# Patient Record
Sex: Male | Born: 1945 | Race: White | Hispanic: No | Marital: Married
Health system: Midwestern US, Community
[De-identification: ages and names within clinical notes are randomized; demographics above are authoritative.]

## PROBLEM LIST (undated history)

## (undated) DIAGNOSIS — I25709 Atherosclerosis of coronary artery bypass graft(s), unspecified, with unspecified angina pectoris: Principal | ICD-10-CM

## (undated) DIAGNOSIS — R350 Frequency of micturition: Secondary | ICD-10-CM

## (undated) DIAGNOSIS — G5602 Carpal tunnel syndrome, left upper limb: Secondary | ICD-10-CM

## (undated) DIAGNOSIS — I5023 Acute on chronic systolic (congestive) heart failure: Principal | ICD-10-CM

## (undated) DIAGNOSIS — M353 Polymyalgia rheumatica: Secondary | ICD-10-CM

## (undated) DIAGNOSIS — N401 Enlarged prostate with lower urinary tract symptoms: Secondary | ICD-10-CM

## (undated) DIAGNOSIS — I480 Paroxysmal atrial fibrillation: Principal | ICD-10-CM

## (undated) DIAGNOSIS — I10 Essential (primary) hypertension: Principal | ICD-10-CM

## (undated) DIAGNOSIS — H44003 Unspecified purulent endophthalmitis, bilateral: Secondary | ICD-10-CM

## (undated) DIAGNOSIS — I509 Heart failure, unspecified: Principal | ICD-10-CM

## (undated) DIAGNOSIS — S81802A Unspecified open wound, left lower leg, initial encounter: Secondary | ICD-10-CM

## (undated) DIAGNOSIS — M25511 Pain in right shoulder: Principal | ICD-10-CM

## (undated) DIAGNOSIS — M5417 Radiculopathy, lumbosacral region: Secondary | ICD-10-CM

---

## 2016-08-22 IMAGING — DX ACUTE ABDOMINAL SERIES
4 series · 4 of 4 positions shown · non-contrast
Comparison: none

ACUTE ABDOMINAL SERIES, 08/22/2016 [DATE]:
CLINICAL INDICATION: History of chronic abdominal pain periumbilical. Previous
appendectomy and hernia repair

[AP (1 of 3)]
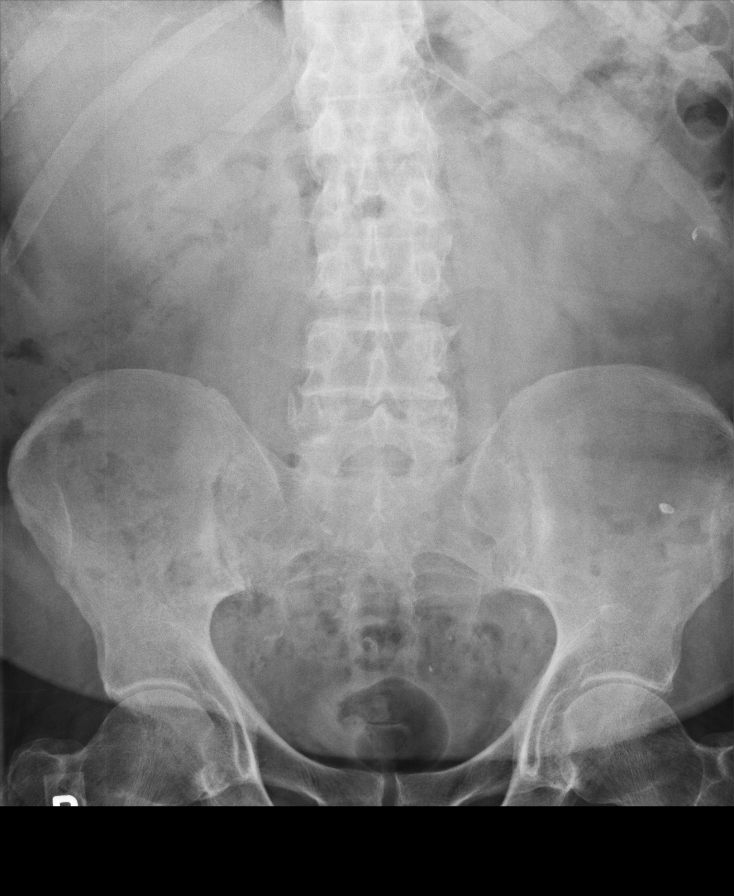

[AP (2 of 3)]
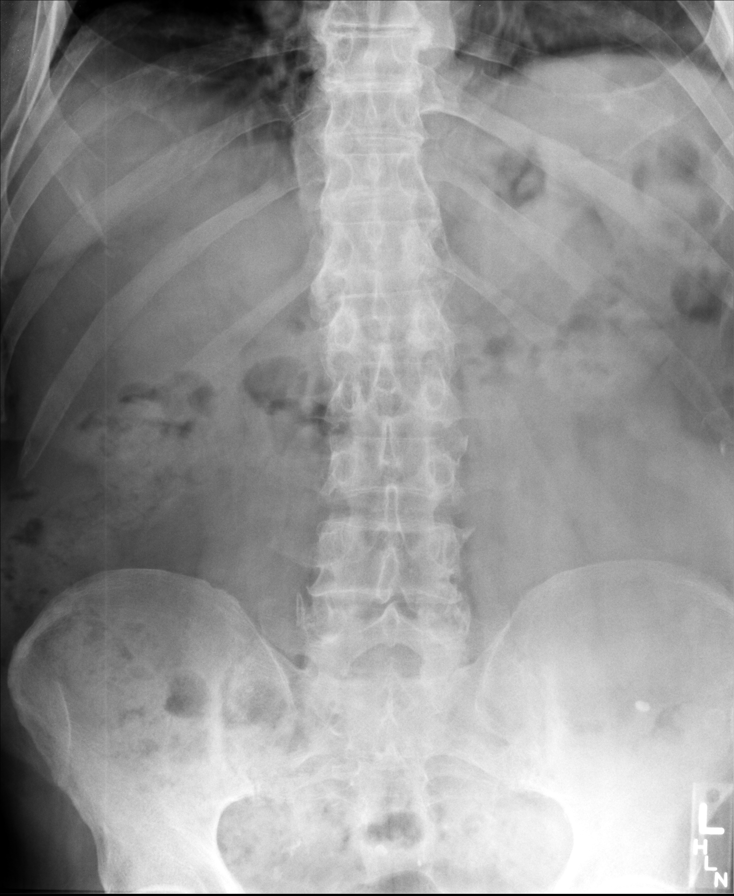

[AP (3 of 3)]
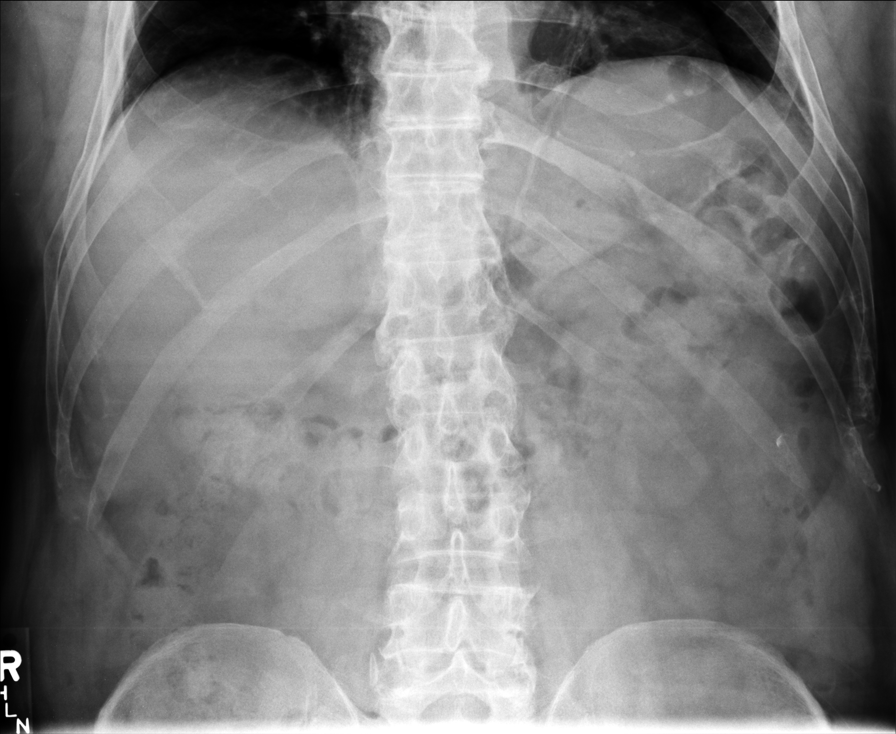

[PA]
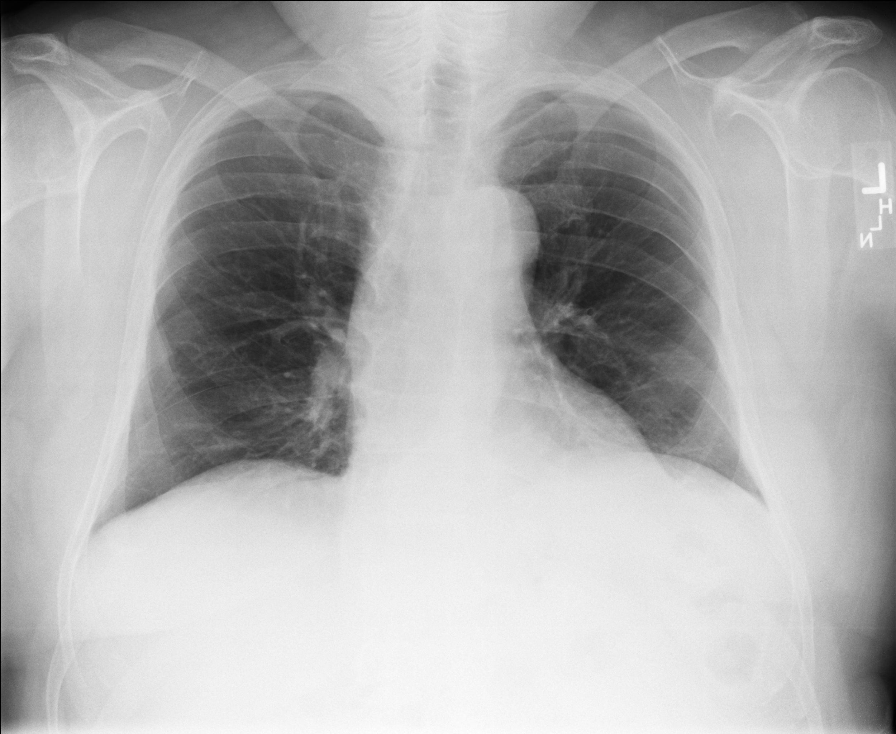

[4 of 4 positions shown; findings below may reference images not displayed]

FINDINGS: Stool is seen throughout the colon. Changes of old granulomatous
disease. Mild left basilar atelectasis. I do not see obstructive changes. No
destructive changes seen. No fracture.
IMPRESSION: Mild left basilar atelectasis, degenerative changes and stool throughout the
colon. Otherwise unremarkable exam. No obstructive changes seen.

## 2017-02-22 IMAGING — DX LUMBAR SPINE COMPLETE
5 series · 5 of 5 positions shown · non-contrast
Comparison: none

LUMBAR SPINE COMPLETE, 02/22/2017 [DATE]:
CLINICAL INDICATION:  History of back pain. Low back pain without
radiculopathy.
No trauma. Pain occurred after activity of working around the house.
COMPARISON EXAMINATIONS: Radiographs of the abdomen of 08/22/2016.

[swimmers]
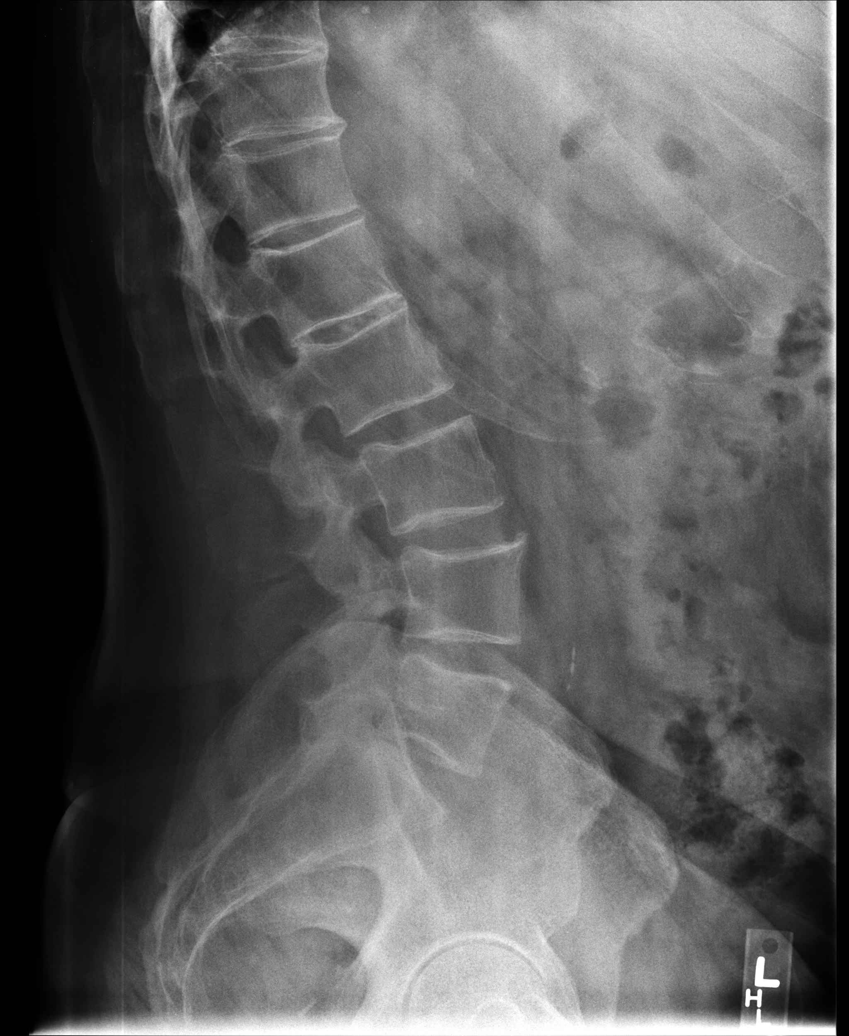

[lpo]
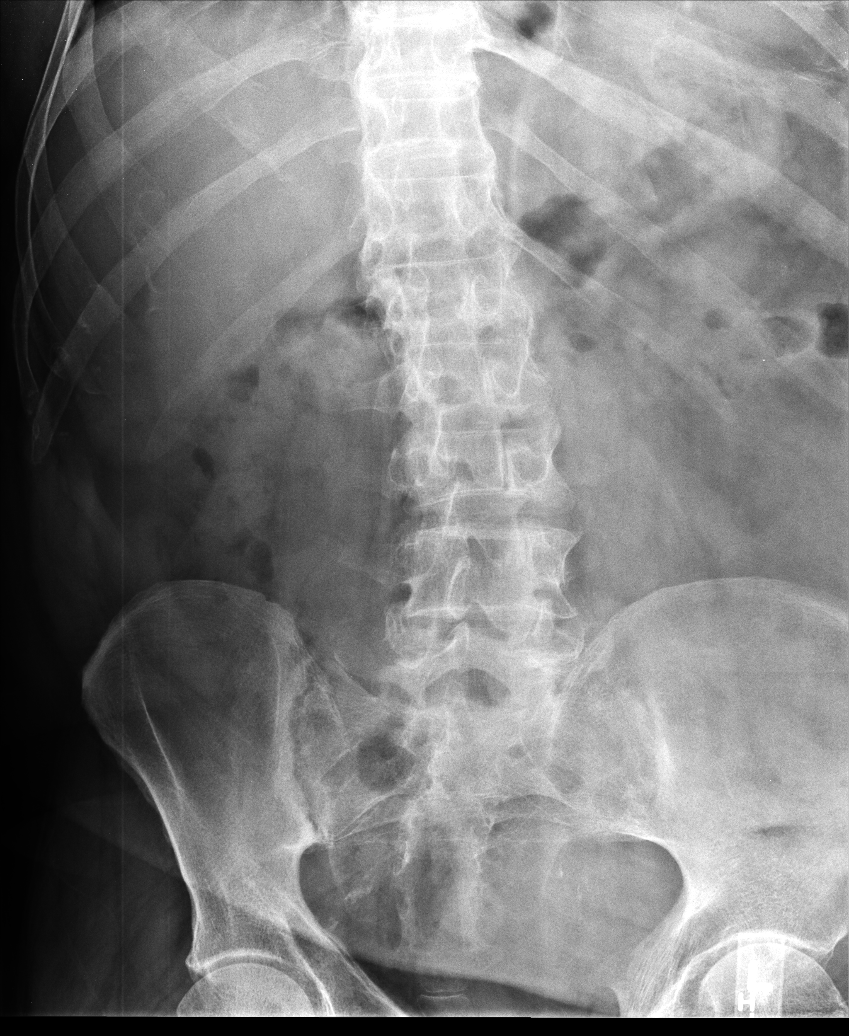

[l5 s1]
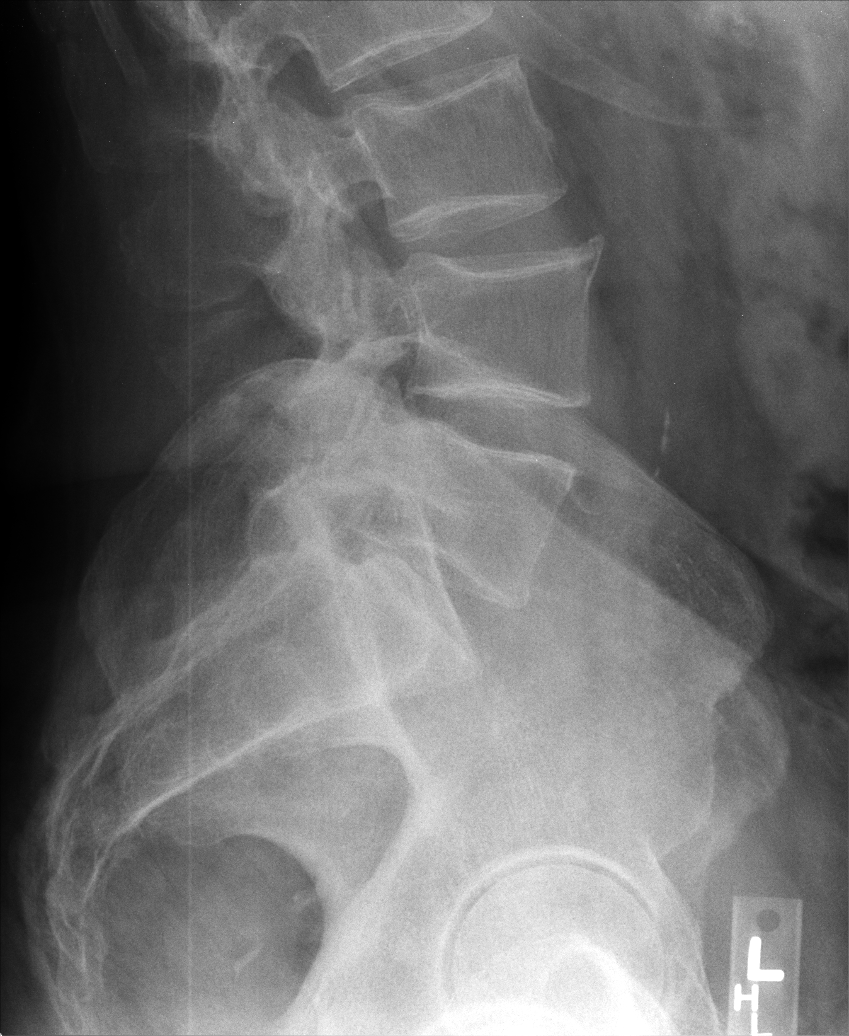

[AP]
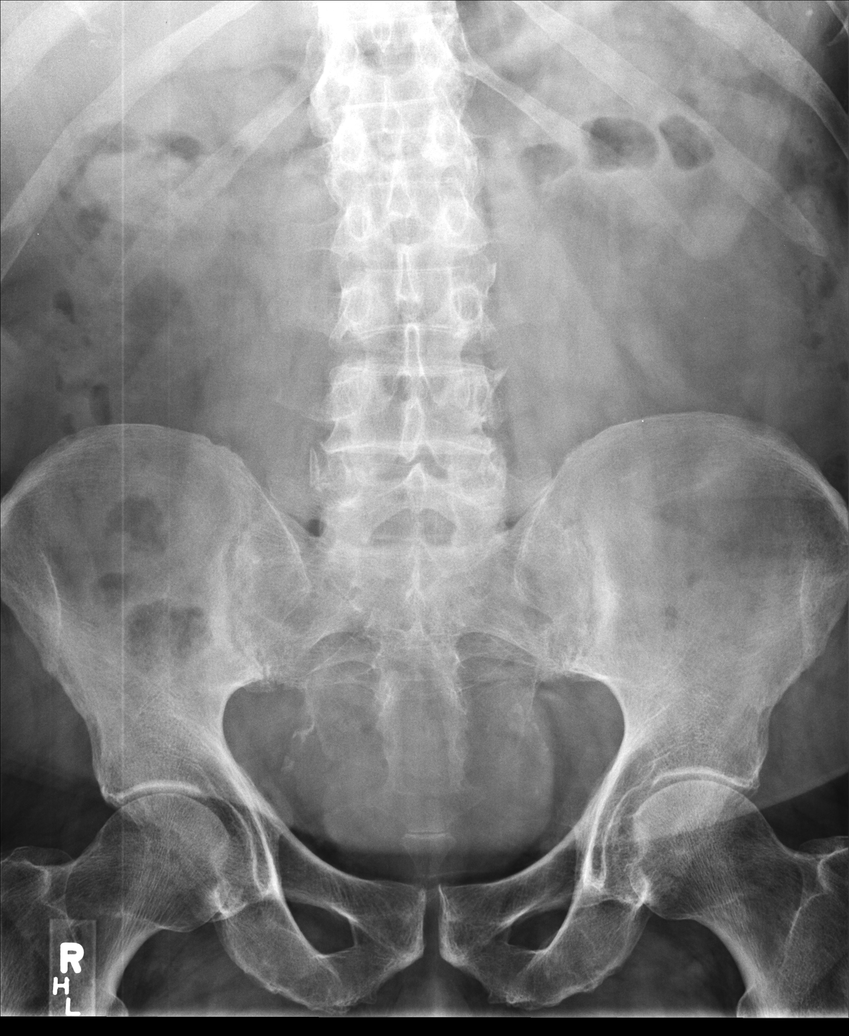

[rpo]
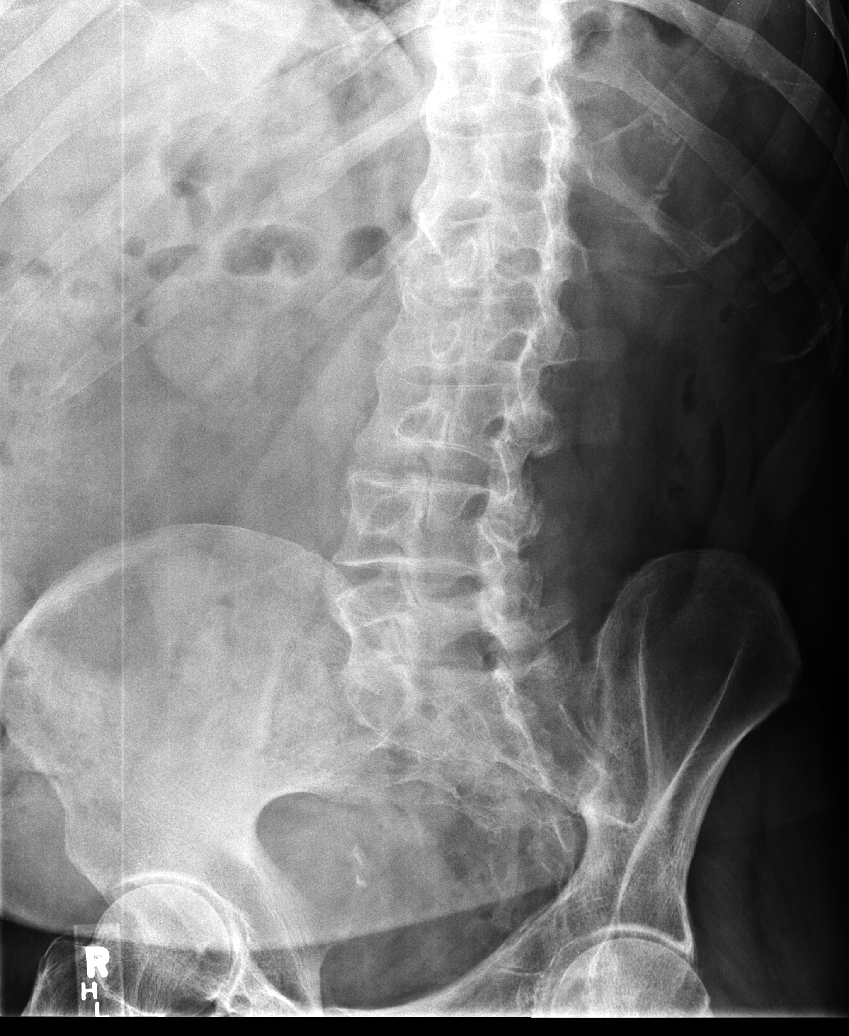

[5 of 5 positions shown; findings below may reference images not displayed]

FINDINGS: There are 5 lumbar-type vertebral bodies. No vertebral body
fracture.
No spondylolisthesis. Scattered interbody osteophytic spurring. Mild lower
lumbar facet hypertrophy. Mild degenerative changes SI joints. Mild vertebral
body endplate scalloping. Mild scattered vascular calcifications. Normal bowel
gas pattern.
IMPRESSION: Mild spondylotic changes lumbar spine.

## 2017-07-12 ENCOUNTER — Inpatient Hospital Stay: Admit: 2017-07-12 | Payer: MEDICARE | Primary: Internal Medicine

## 2017-07-12 ENCOUNTER — Encounter

## 2017-07-12 DIAGNOSIS — M545 Low back pain: Secondary | ICD-10-CM

## 2019-05-14 IMAGING — DX CHEST PA AND LATERAL
1 series · 2 of 2 positions shown · non-contrast
Comparison: none

CLINICAL INDICATION:  Short of breath on exertion intermittently for one year. 
Hypertension. On medication.

[Series 1: PA · U · 0.17mm/px · 2 of 2 slices shown]
[im 1/2]
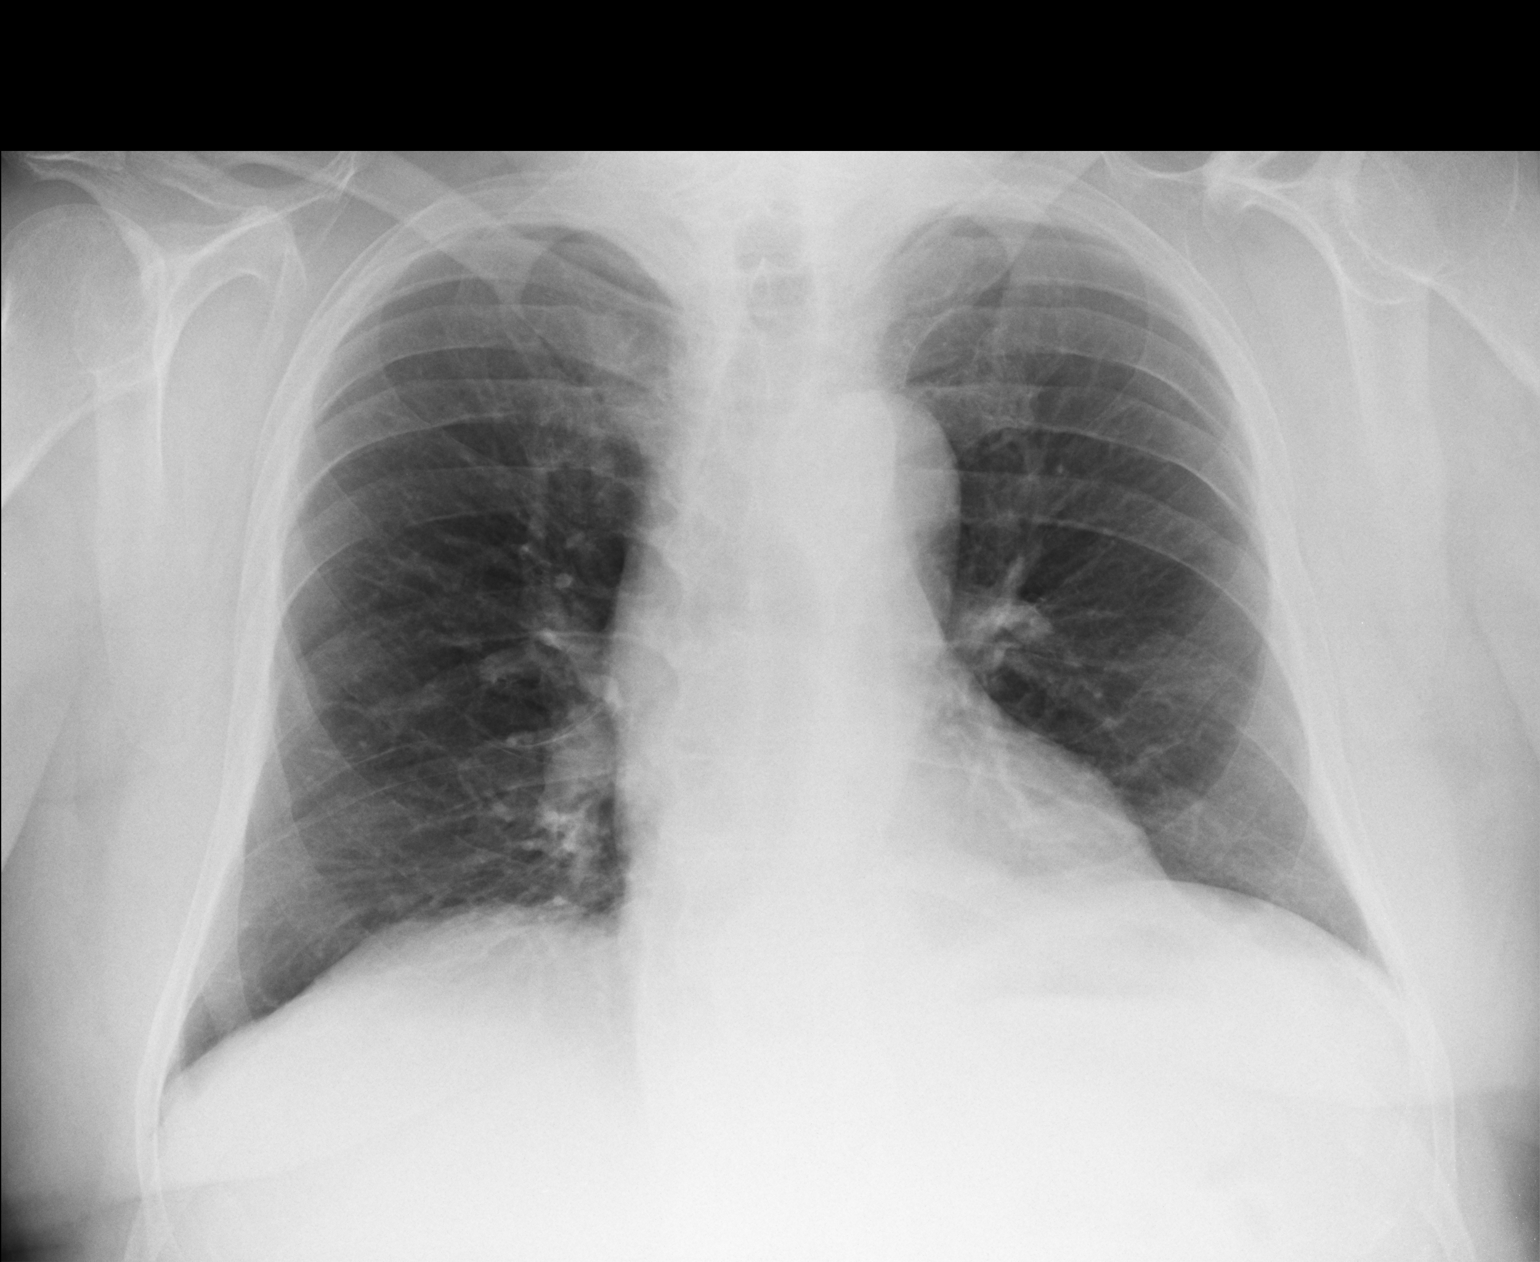
[im 2/2]
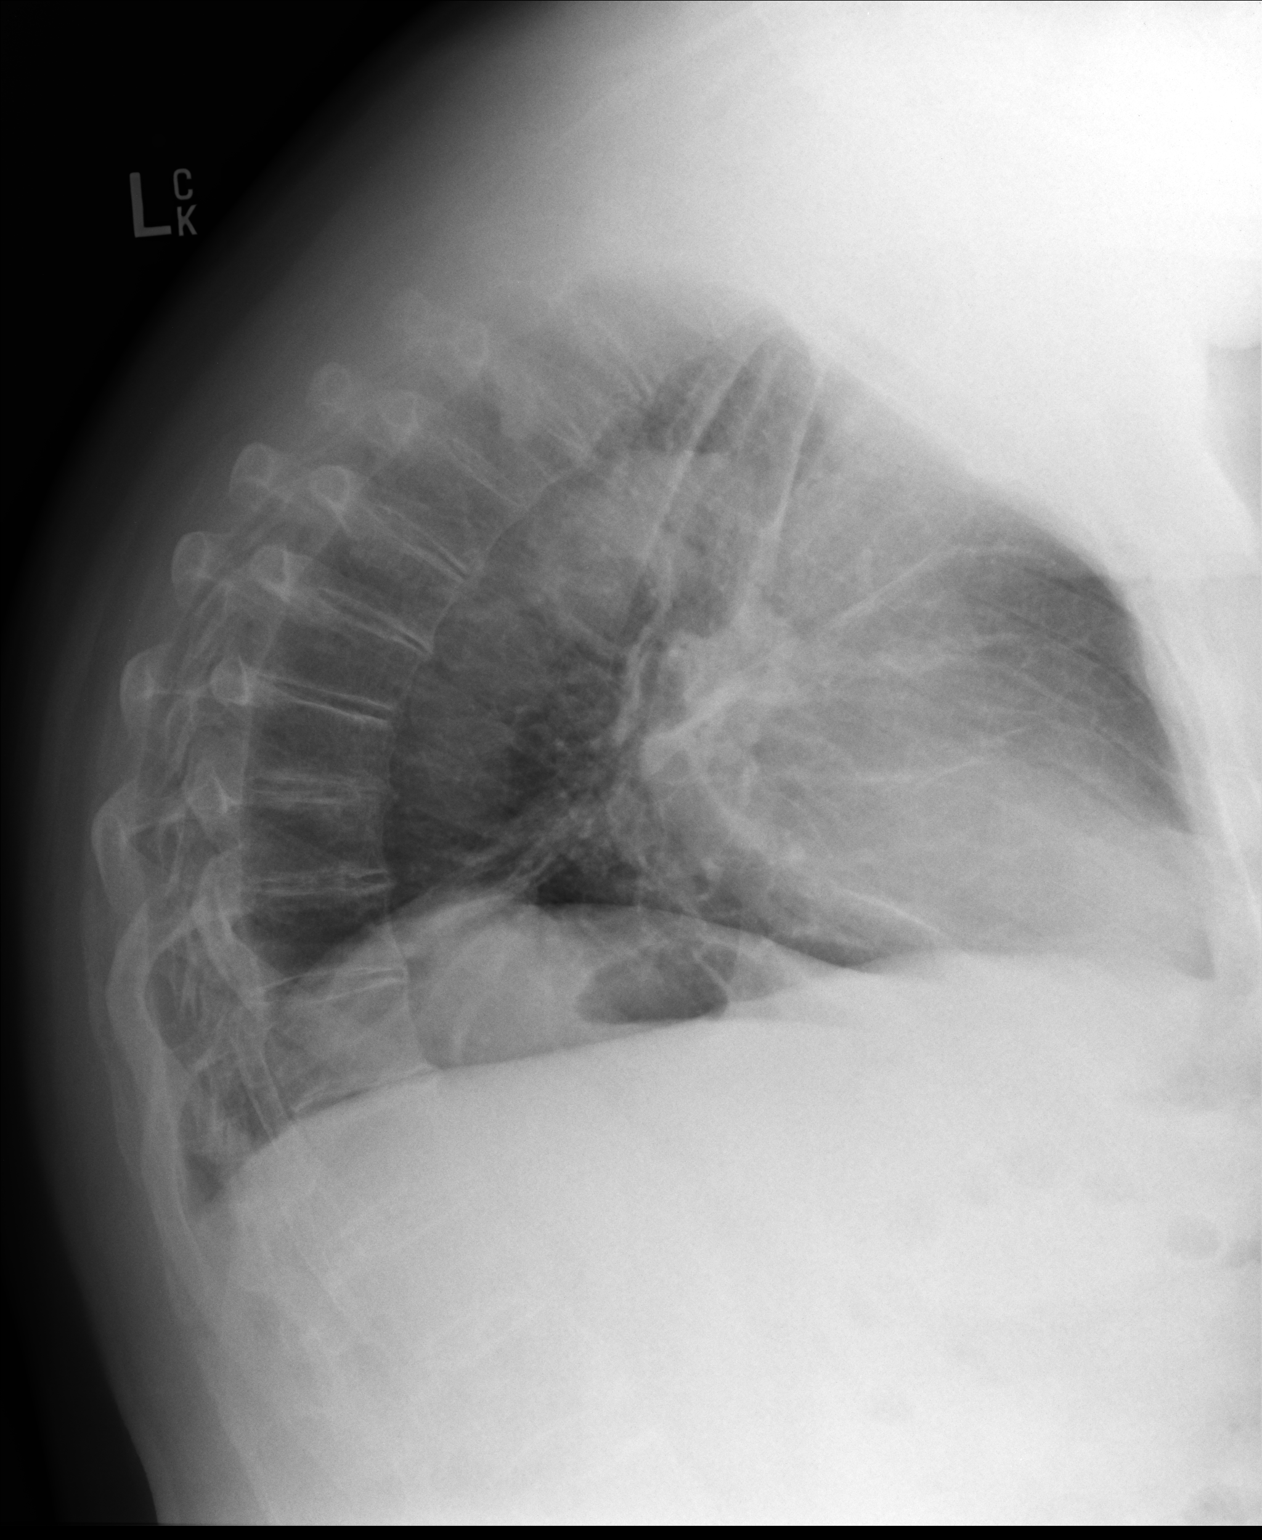

[2 of 2 positions shown; findings below may reference images not displayed]

FINDINGS: The heart and mediastinum are normal. The lungs are well-expanded and 
clear. The bones are intact. Obesity.
IMPRESSION: Normal findings on chest x-ray.

## 2019-09-04 ENCOUNTER — Emergency Department: Admit: 2019-09-04 | Payer: MEDICARE | Primary: Family Medicine

## 2019-09-04 ENCOUNTER — Inpatient Hospital Stay
Admission: EM | Admit: 2019-09-04 | Discharge: 2019-09-06 | Disposition: A | Discharge status: Home / Self Care | Payer: Medicare Other | Source: Other Acute Inpatient Hospital | Admitting: Family Medicine

## 2019-09-04 DIAGNOSIS — I4891 Unspecified atrial fibrillation: Secondary | ICD-10-CM

## 2019-09-04 LAB — CBC WITH AUTO DIFFERENTIAL
Basophils %: 0.7 % (ref 0.0–2.0)
Basophils Absolute: 0.08 E9/L (ref 0.00–0.20)
Eosinophils %: 1.1 % (ref 0.0–6.0)
Eosinophils Absolute: 0.13 E9/L (ref 0.05–0.50)
Hematocrit: 48.7 % (ref 37.0–54.0)
Hemoglobin: 16.6 g/dL — ABNORMAL HIGH (ref 12.5–16.5)
Immature Granulocytes #: 0.05 E9/L
Immature Granulocytes %: 0.4 % (ref 0.0–5.0)
Lymphocytes %: 21 % (ref 20.0–42.0)
Lymphocytes Absolute: 2.4 E9/L (ref 1.50–4.00)
MCH: 30.5 pg (ref 26.0–35.0)
MCV: 89.4 fL (ref 80.0–99.9)
MPV: 10.4 fL (ref 7.0–12.0)
Monocytes %: 7.7 % (ref 2.0–12.0)
Monocytes Absolute: 0.88 E9/L (ref 0.10–0.95)
Neutrophils %: 69.1 % (ref 43.0–80.0)
Neutrophils Absolute: 7.9 E9/L — ABNORMAL HIGH (ref 1.80–7.30)
Platelets: 277 E9/L (ref 130–450)
RBC: 5.45 E12/L (ref 3.80–5.80)
RDW: 12.9 fL (ref 11.5–15.0)
WBC: 11.4 E9/L (ref 4.5–11.5)

## 2019-09-04 LAB — BASIC METABOLIC PANEL W/ REFLEX TO MG FOR LOW K
Anion Gap: 16 mmol/L (ref 7–16)
BUN: 15 mg/dL (ref 8–23)
CO2: 26 mmol/L (ref 22–29)
Calcium: 9.3 mg/dL (ref 8.6–10.2)
Chloride: 96 mmol/L — ABNORMAL LOW (ref 98–107)
Creatinine: 1 mg/dL (ref 0.7–1.2)
GFR African American: >60
GFR Non-African American: >60 mL/min/{1.73_m2} (ref >=60)
Glucose: 111 mg/dL — ABNORMAL HIGH (ref 74–99)
Potassium reflex Magnesium: 3.5 mmol/L (ref 3.5–5.0)
Sodium: 138 mmol/L (ref 132–146)

## 2019-09-04 LAB — URINALYSIS
Bilirubin Urine: NEGATIVE
Glucose, Ur: NEGATIVE mg/dL
Ketones, Urine: NEGATIVE mg/dL
Leukocyte Esterase, Urine: NEGATIVE
Nitrite, Urine: NEGATIVE
Protein, UA: NEGATIVE mg/dL
Specific Gravity, UA: <=1.005 (ref 1.005–1.030)
Urobilinogen, Urine: 0.2 E.U./dL (ref <2.0)
pH, UA: 6 (ref 5.0–9.0)

## 2019-09-04 LAB — MICROSCOPIC URINALYSIS
Bacteria, UA: NONE SEEN /HPF
Epithelial Cells, UA: NONE SEEN /HPF

## 2019-09-04 LAB — TROPONIN: Troponin: <0.01 ng/mL (ref 0.00–0.03)

## 2019-09-04 LAB — MAGNESIUM: Magnesium: 2.1 mg/dL (ref 1.6–2.6)

## 2019-09-04 LAB — TSH: TSH: 1.67 u[IU]/mL (ref 0.270–4.200)

## 2019-09-04 LAB — BRAIN NATRIURETIC PEPTIDE: Pro-BNP: 106 pg/mL (ref 0–450)

## 2019-09-04 MED ORDER — HYDROCHLOROTHIAZIDE 12.5 MG PO TABS
12.5 MG | Freq: Every day | ORAL | Status: DC
Start: 2019-09-04 — End: 2019-09-06
  Administered 2019-09-05 – 2019-09-06 (×2): 12.5 mg via ORAL

## 2019-09-04 MED ORDER — LOSARTAN POTASSIUM-HCTZ 100-12.5 MG PO TABS
Freq: Every day | ORAL | Status: DC
Start: 2019-09-04 — End: 2019-09-04

## 2019-09-04 MED ORDER — PANTOPRAZOLE SODIUM 40 MG PO TBEC
40 | Freq: Every day | ORAL | Status: DC
Start: 2019-09-04 — End: 2019-09-06
  Administered 2019-09-05 – 2019-09-06 (×2): 40 mg via ORAL

## 2019-09-04 MED ORDER — LOSARTAN POTASSIUM 100 MG PO TABS
100 MG | Freq: Every day | ORAL | Status: DC
Start: 2019-09-04 — End: 2019-09-06
  Administered 2019-09-05 – 2019-09-06 (×2): 100 mg via ORAL

## 2019-09-04 MED ORDER — ASPIRIN 325 MG PO TABS
325 MG | Freq: Once | ORAL | Status: AC
Start: 2019-09-04 — End: 2019-09-04
  Administered 2019-09-04: 21:00:00 325 mg via ORAL

## 2019-09-04 MED ORDER — METOPROLOL SUCCINATE ER 25 MG PO TB24
25 MG | Freq: Every day | ORAL | Status: DC
Start: 2019-09-04 — End: 2019-09-04

## 2019-09-04 MED ORDER — ENOXAPARIN SODIUM 100 MG/ML SC SOLN
100 MG/ML | Freq: Two times a day (BID) | SUBCUTANEOUS | Status: DC
Start: 2019-09-04 — End: 2019-09-05
  Administered 2019-09-05: 13:00:00 100 mg via SUBCUTANEOUS

## 2019-09-04 MED ORDER — DILTIAZEM HCL 25 MG/5ML IV SOLN
255 MG/5ML | Freq: Once | INTRAVENOUS | Status: AC
Start: 2019-09-04 — End: 2019-09-04
  Administered 2019-09-04: 22:00:00 25 mg via INTRAVENOUS

## 2019-09-04 MED ORDER — DEXTROSE 5 % IV SOLN (ADD-VANTAGE)
5 % | INTRAVENOUS | Status: DC
Start: 2019-09-04 — End: 2019-09-04

## 2019-09-04 MED ORDER — DEXTROSE 5 % IV SOLN
5 % | INTRAVENOUS | Status: DC
Start: 2019-09-04 — End: 2019-09-04

## 2019-09-04 MED ORDER — ENOXAPARIN SODIUM 100 MG/ML SC SOLN
100 MG/ML | Freq: Once | SUBCUTANEOUS | Status: AC
Start: 2019-09-04 — End: 2019-09-04
  Administered 2019-09-04: 23:00:00 100 mg/kg via SUBCUTANEOUS

## 2019-09-04 MED FILL — DILTIAZEM HCL 125 MG/25ML IV SOLN: 125 MG/25ML | INTRAVENOUS | Qty: 25

## 2019-09-04 MED FILL — ASPIRIN 325 MG PO TABS: 325 mg | ORAL | Qty: 1

## 2019-09-04 MED FILL — DILTIAZEM HCL 25 MG/5ML IV SOLN: 25 MG/5ML | INTRAVENOUS | Qty: 5

## 2019-09-04 MED FILL — HYDROCHLOROTHIAZIDE 12.5 MG PO TABS: 12.5 mg | ORAL | Qty: 1

## 2019-09-04 MED FILL — COZAAR 100 MG PO TABS: 100 mg | ORAL | Qty: 1

## 2019-09-04 MED FILL — LOVENOX 100 MG/ML SC SOLN: 100 mg/mL | SUBCUTANEOUS | Qty: 1

## 2019-09-04 NOTE — ED Notes (Signed)
Pt. Report called to ICU.     Kathaleen Bury [facility], RN  09/04/19 1934

## 2019-09-04 NOTE — ED Provider Notes (Signed)
73 year old male with hypertension presenting with tachycardia.  He states that he has had intermittent episodes of racing heart for the past week and a half.  Today however his heart rate did feel faster and that is why he presented.  He states he had a similar episode a couple months ago, but was never seen for it.  He is also complaining of increased urination for the past month as well as increased thirst.  He is endorsing some chest pressure and shortness of breath.  He is denying dizziness, nausea, and recent illness.    The history is provided by the patient.   Palpitations   Palpitations quality:  Irregular  Onset quality:  Sudden  Timing:  Intermittent  Progression:  Worsening  Chronicity:  New  Relieved by:  Nothing  Worsened by:  Nothing  Ineffective treatments:  None tried  Associated symptoms: chest pressure and shortness of breath    Associated symptoms: no dizziness, no nausea, no numbness and no weakness         Review of Systems   Constitutional: Positive for fatigue. Negative for chills and fever.   Respiratory: Positive for shortness of breath.    Cardiovascular: Positive for palpitations.   Gastrointestinal: Negative for abdominal pain and nausea.   Endocrine: Positive for polydipsia.   Genitourinary: Positive for frequency.   Neurological: Negative for dizziness, weakness, light-headedness and numbness.        Physical Exam  Constitutional:       General: He is not in acute distress.     Appearance: Normal appearance. He is obese. He is not ill-appearing or toxic-appearing.      Comments: Appears uncomfortable   Eyes:      Extraocular Movements: Extraocular movements intact.      Conjunctiva/sclera: Conjunctivae normal.      Pupils: Pupils are equal, round, and reactive to light.   Cardiovascular:      Rate and Rhythm: Tachycardia present. Rhythm irregular.   Pulmonary:      Effort: Pulmonary effort is normal. No respiratory distress.      Breath sounds: Normal breath sounds.   Abdominal:       General: There is no distension.      Palpations: Abdomen is soft.      Tenderness: There is no abdominal tenderness.   Musculoskeletal:      Comments: Trace pitting edema lower extremities bilaterally   Skin:     General: Skin is warm and dry.   Neurological:      General: No focal deficit present.      Mental Status: He is alert and oriented to person, place, and time.          Procedures     MDM  Number of Diagnoses or Management Options  Atrial fibrillation with RVR [facility](HCC):   Diagnosis management comments: 73 year old male with hypertension presenting in new-onset A. fib RVR with heart rate up 152.  He received 25 mg Cardizem with decrease of heart rate to 111.  He was then placed on Cardizem drip.  CHA2DS2-VASc=  2.  CBC, BMP, BNP, TSH, troponin, urinalysis, chest x-ray unremarkable.  He received Lovenox and aspirin.  Dr. Excell SeltzerBaker was consulted and agreed to admit the patient.  He remained hemodynamically stable in the ED.  He was admitted in stable condition.           ED Course as of Sep 03 1844   Thu Sep 04, 2019   1639 EKG at 1636, HR  at 131, Afib with RVR, left axis devation, no previous EKG to compare    [BP]   1812 Rechecked patient, HR between 102-118 in room, no symptoms    [BP]   1814 Patient remains tachycardic.  Cardizem infusion ordered.    [AP]   [id number] Spoke with Dr. Excell Seltzer. Agreed with plan    [BP]      ED Course User Index  [AP] Arn Medal, MD  [BP] Dawson Bills, DO        --------------------------------------------- PAST HISTORY ---------------------------------------------  Past Medical History:  has no past medical history on file.    Past Surgical History:  has a past surgical history that includes Appendectomy.    Social History:  reports that he has never smoked. He does not have any smokeless tobacco history on file. He reports previous alcohol use. He reports that he does not use drugs.    Family History: family history is not on file.     The patient???s home medications have been  reviewed.    Allergies: Sulfa antibiotics    -------------------------------------------------- RESULTS -------------------------------------------------    Lab  Results for orders placed or performed during the hospital encounter of 09/04/19   CBC Auto Differential   Result Value Ref Range    WBC 11.4 4.5 - 11.5 E9/L    RBC 5.45 3.80 - 5.80 E12/L    Hemoglobin 16.6 (H) 12.5 - 16.5 g/dL    Hematocrit 28.3 66.2 - 54.0 %    MCV 89.4 80.0 - 99.9 fL    MCH 30.5 26.0 - 35.0 pg    MCHC 34.1 32.0 - 34.5 %    RDW 12.9 11.5 - 15.0 fL    Platelets 277 130 - 450 E9/L    MPV 10.4 7.0 - 12.0 fL    Neutrophils % 69.1 43.0 - 80.0 %    Immature Granulocytes % 0.4 0.0 - 5.0 %    Lymphocytes % 21.0 20.0 - 42.0 %    Monocytes % 7.7 2.0 - 12.0 %    Eosinophils % 1.1 0.0 - 6.0 %    Basophils % 0.7 0.0 - 2.0 %    Neutrophils Absolute 7.90 (H) 1.80 - 7.30 E9/L    Immature Granulocytes # 0.05 E9/L    Lymphocytes Absolute 2.40 1.50 - 4.00 E9/L    Monocytes Absolute 0.88 0.10 - 0.95 E9/L    Eosinophils Absolute 0.13 0.05 - 0.50 E9/L    Basophils Absolute 0.08 0.00 - 0.20 E9/L   Basic Metabolic Panel w/ Reflex to MG   Result Value Ref Range    Sodium 138 132 - 146 mmol/L    Potassium reflex Magnesium 3.5 3.5 - 5.0 mmol/L    Chloride 96 (L) 98 - 107 mmol/L    CO2 26 22 - 29 mmol/L    Anion Gap 16 7 - 16 mmol/L    Glucose 111 (H) 74 - 99 mg/dL    BUN 15 8 - 23 mg/dL    CREATININE 1.0 0.7 - 1.2 mg/dL    GFR Non-African American >60 >=60 mL/min/1.73    GFR African American >60     Calcium 9.3 8.6 - 10.2 mg/dL   Urinalysis, reflex to microscopic   Result Value Ref Range    Color, UA Straw Straw/Yellow    Clarity, UA Clear Clear    Glucose, Ur Negative Negative mg/dL    Bilirubin Urine Negative Negative    Ketones, Urine Negative Negative mg/dL    Specific Gravity, UA <=  1.005 1.005 - 1.030    Blood, Urine TRACE (A) Negative    pH, UA 6.0 5.0 - 9.0    Protein, UA Negative Negative mg/dL    Urobilinogen, Urine 0.2 <2.0 E.U./dL    Nitrite, Urine  Negative Negative    Leukocyte Esterase, Urine Negative Negative   Troponin   Result Value Ref Range    Troponin <0.01 0.00 - 0.03 ng/mL   TSH without Reflex   Result Value Ref Range    TSH 1.670 0.270 - 4.200 uIU/mL   Brain Natriuretic Peptide   Result Value Ref Range    Pro-BNP 106 0 - 450 pg/mL   Microscopic Urinalysis   Result Value Ref Range    WBC, UA 0-1 0 - 5 /HPF    RBC, UA NONE 0 - 2 /HPF    Epithelial Cells, UA NONE SEEN /HPF    Bacteria, UA NONE SEEN None Seen /HPF   Magnesium   Result Value Ref Range    Magnesium 2.1 1.6 - 2.6 mg/dL   EKG 12 Lead   Result Value Ref Range    Ventricular Rate 131 BPM    Atrial Rate 170 BPM    QRS Duration 100 ms    Q-T Interval 312 ms    QTc Calculation (Bazett) 460 ms    R Axis -35 degrees    T Axis 16 degrees       Radiology  XR CHEST PORTABLE   Final Result   Limited examination due to portable technique.  No obvious acute   cardiopulmonary process.             EKG:  This EKG is signed and interpreted by me.    Rate: 131  Rhythm: Atrial fibrillation  Interpretation: atrial fibrillation (new onset)  Comparison: changes compared to previous EKG      ------------------------- NURSING NOTES AND VITALS REVIEWED ---------------------------  Date / Time Roomed:  09/04/2019  4:27 PM  ED Bed Assignment:  13/13    The nursing notes within the ED encounter and vital signs as below have been reviewed.   Patient Vitals for the past 24 hrs:   BP Temp Temp src Pulse Resp SpO2 Height Weight   09/04/19 1844 123/79 98.8 ??F (37.1 ??C) Oral 65 18 95 % -- --   09/04/19 1757 117/87 -- -- 111 -- -- -- --   09/04/19 1749 127/81 -- -- 152 -- -- -- --   09/04/19 1646 (!) 166/109 -- -- -- -- -- -- --   09/04/19 1643 -- -- -- 145 -- -- -- --   09/04/19 1617 -- 97 ??F (36.1 ??C) -- 127 20 96 % 5\' 8"  (1.727 m) 210 lb (95.3 kg)       Oxygen Saturation Interpretation: Normal      ------------------------------------------ PROGRESS NOTES ------------------------------------------  Re-evaluation(s):  See  ED course above    I have spoken with the patient and discussed today???s results, in addition to providing specific details for the plan of care and counseling regarding the diagnosis and prognosis.  Their questions are answered at this time and they are agreeable with the plan.      --------------------------------- ADDITIONAL PROVIDER NOTES ---------------------------------  Consultations:  See ED course above    This patient's ED course included: a personal history and physicial examination, re-evaluation prior to disposition, multiple bedside re-evaluations, IV medications, cardiac monitoring, continuous pulse oximetry and complex medical decision making and emergency management    This patient has remained  hemodynamically stable during their ED course.    Please note that the withdrawal or failure to initiate urgent interventions for this patient would likely result in a life threatening deterioration or permanent disability.      Accordingly this patient received 30 minutes of critical care time, excluding separately billable procedures.      Clinical Impression  1. Atrial fibrillation with RVR (HCC)          Disposition  Patient's disposition: Admit to CCU/ICU  Patient's condition is stable.          Arn MedalAnna Christol Thetford, MD  Resident  09/04/19 [phone]

## 2019-09-04 NOTE — ED Notes (Signed)
Pt. C/o intermittent racing heart for the past week and a half. States it's been worse today.     Kathaleen Bury [facility], RN  09/04/19 [phone]

## 2019-09-04 NOTE — Progress Notes (Signed)
Progress Note    History:  73 yo male admitted through ER with tachycardia. Patient states episode three months ago of fast heart rate lasting two hours, did not seek care. Awoke this AM with HR 40, felt nonspecifically unwell all day. This afternoon checked BP, was elevated, and HR was 150+. Presented to ER revealing AF with RVR. Treated with diltiazem IV with improvement. BP has more elevated lately overall.    Presently, feels improved, no actual chest pain as had pressure earlier. Continues to feel a bit short of breath, no cough or production. Lately has experienced more frequency of urination, occas has some edema lower extremities.    Meds: losartan HCT, toprol XL, omeprazole, occas ibuprofen    Patient's medical and surgical history, medications and allergies were reviewed. Interval notes of consultants, nurses, therapists, and ancillary services were also reviewed, as well as the medical administration record.    Physical Exam:  Vitals:    09/04/19 1646 09/04/19 1749 09/04/19 1757 09/04/19 1844   BP: (!) 166/109 127/81 117/87 123/79   Pulse:  152 111 65   Resp:    18   Temp:    98.8 ??F (37.1 ??C)   TempSrc:    Oral   SpO2:    95%   Weight:       Height:         Pleasant male, sensorium clear, anicteric, acyanotic, skin dry. HEENT exam limited due to equipment availability, no gross abnormalities. Affect normal, as is content of conversation. Lumgs clear and nonlabored, no wheezes or rales. Heart tones fair, occas ectopic, no S3S4 or rubs, HR now 64-72/min. Abdomen soft with hypoactive bowel noise, no organomegaly, no rebound or rigidity, mild discomfort with palpation left periumbilical area. Distal circ intact, no edema.    Assessment/Plan:   1. Atrial fib with RVR  2. Ess hypertension  3. GERD  4. OA  5. Urinary frequency  Discussed with the patient, cycle troponin, cardiology consultation, IV cardizem gtts, resume regular meds, lovenox SQ and broach anticoagulation  Electronically by Perrin Smack, DO  on  09/04/19 at 7:07 PM EDT

## 2019-09-04 NOTE — ED Notes (Signed)
Attempted to call report to ICU, nurse unavailable, nurse to call back.     Kathaleen Bury [facility], RN  09/04/19 1901

## 2019-09-04 NOTE — ED Notes (Signed)
Pt. Resting in bed, easy resp.     Kathaleen Bury [facility], RN  09/04/19 [phone]

## 2019-09-05 LAB — BASIC METABOLIC PANEL
Anion Gap: 14 mmol/L (ref 7–16)
BUN: 19 mg/dL (ref 8–23)
CO2: 26 mmol/L (ref 22–29)
Calcium: 8.8 mg/dL (ref 8.6–10.2)
Chloride: 101 mmol/L (ref 98–107)
Creatinine: 1 mg/dL (ref 0.7–1.2)
GFR African American: >60
GFR Non-African American: >60 mL/min/{1.73_m2} (ref >=60)
Glucose: 98 mg/dL (ref 74–99)
Potassium: 3.9 mmol/L (ref 3.5–5.0)
Sodium: 141 mmol/L (ref 132–146)

## 2019-09-05 LAB — LIPID PANEL
Cholesterol, Total: 177 mg/dL (ref 0–199)
HDL: 34 mg/dL (ref >40)
LDL Calculated: 107 mg/dL — ABNORMAL HIGH (ref 0–99)
Triglycerides: 182 mg/dL — ABNORMAL HIGH (ref 0–149)
VLDL Cholesterol Calculated: 36 mg/dL

## 2019-09-05 LAB — EKG 12-LEAD
Atrial Rate: 170 {beats}/min
Q-T Interval: 312 ms
QRS Duration: 100 ms
QTc Calculation (Bazett): 460 ms
R Axis: -35 degrees
T Axis: 16 degrees
Ventricular Rate: 131 {beats}/min

## 2019-09-05 LAB — ECHOCARDIOGRAM COMPLETE 2D W DOPPLER W COLOR: Left Ventricular Ejection Fraction: 63

## 2019-09-05 LAB — CBC WITH AUTO DIFFERENTIAL
Basophils %: 1.2 % (ref 0.0–2.0)
Basophils Absolute: 0.09 E9/L (ref 0.00–0.20)
Eosinophils %: 2.6 % (ref 0.0–6.0)
Eosinophils Absolute: 0.2 E9/L (ref 0.05–0.50)
Hematocrit: 43.4 % (ref 37.0–54.0)
Hemoglobin: 14.8 g/dL (ref 12.5–16.5)
Immature Granulocytes #: 0.05 E9/L
Immature Granulocytes %: 0.7 % (ref 0.0–5.0)
Lymphocytes %: 25.9 % (ref 20.0–42.0)
Lymphocytes Absolute: 1.97 E9/L (ref 1.50–4.00)
MCH: 30 pg (ref 26.0–35.0)
MCHC: 34.1 % (ref 32.0–34.5)
MCV: 88 fL (ref 80.0–99.9)
MPV: 10.3 fL (ref 7.0–12.0)
Monocytes %: 9.8 % (ref 2.0–12.0)
Monocytes Absolute: 0.75 E9/L (ref 0.10–0.95)
Neutrophils %: 59.8 % (ref 43.0–80.0)
Neutrophils Absolute: 4.56 E9/L (ref 1.80–7.30)
Platelets: 240 E9/L (ref 130–450)
RBC: 4.93 E12/L (ref 3.80–5.80)
RDW: 13.3 fL (ref 11.5–15.0)
WBC: 7.6 E9/L (ref 4.5–11.5)

## 2019-09-05 LAB — HEMOGLOBIN A1C: Hemoglobin A1C: 5.3 % (ref 4.0–5.6)

## 2019-09-05 LAB — TROPONIN
Troponin: <0.01 ng/mL (ref 0.00–0.03)
Troponin: <0.01 ng/mL (ref 0.00–0.03)

## 2019-09-05 MED ORDER — APIXABAN 5 MG PO TABS
5 MG | Freq: Two times a day (BID) | ORAL | Status: DC
Start: 2019-09-05 — End: 2019-09-06
  Administered 2019-09-06: 01:00:00 5 mg via ORAL

## 2019-09-05 MED ORDER — METOPROLOL SUCCINATE ER 25 MG PO TB24
25 MG | Freq: Two times a day (BID) | ORAL | Status: DC
Start: 2019-09-05 — End: 2019-09-06
  Administered 2019-09-06 (×2): 25 mg via ORAL

## 2019-09-05 MED ORDER — PERFLUTREN LIPID MICROSPHERE IV SUSP
Freq: Once | INTRAVENOUS | Status: AC | PRN
Start: 2019-09-05 — End: 2019-09-05
  Administered 2019-09-05: 16:00:00 2.2 mL via INTRAVENOUS

## 2019-09-05 MED ORDER — ATORVASTATIN CALCIUM 20 MG PO TABS
20 MG | Freq: Every evening | ORAL | Status: DC
Start: 2019-09-05 — End: 2019-09-06
  Administered 2019-09-06: 01:00:00 20 mg via ORAL

## 2019-09-05 MED ORDER — METOPROLOL SUCCINATE ER 25 MG PO TB24
25 MG | Freq: Every evening | ORAL | Status: DC
Start: 2019-09-05 — End: 2019-09-05
  Administered 2019-09-05: 01:00:00 25 mg via ORAL

## 2019-09-05 MED FILL — HYDROCHLOROTHIAZIDE 12.5 MG PO TABS: 12.5 mg | ORAL | Qty: 1

## 2019-09-05 MED FILL — PANTOPRAZOLE SODIUM 40 MG PO TBEC: 40 mg | ORAL | Qty: 1

## 2019-09-05 MED FILL — COZAAR 100 MG PO TABS: 100 mg | ORAL | Qty: 1

## 2019-09-05 MED FILL — LOVENOX 100 MG/ML SC SOLN: 100 mg/mL | SUBCUTANEOUS | Qty: 1

## 2019-09-05 MED FILL — METOPROLOL SUCCINATE ER 25 MG PO TB24: 25 mg | ORAL | Qty: 1

## 2019-09-05 NOTE — Consults (Addendum)
Inpatient Cardiology Consultation      Reason for Consult: Atrial fibrillation with rapid ventricular response    Consulting Physician: Dr. Alexander MtAlkukuhn    Requesting Physician: Dr. Excell SeltzerBaker    Date of Consultation: 09/05/2019    HISTORY OF PRESENT ILLNESS:     This 73 year old [facility]  He has a history of bradycardia.  He is able to tolerate low-dose metoprolol.    3 weeks ago he had a fast heart rate which lasted 2 hours. He thought he was dehydrated and did not seek medical attention.    His bp has been elevated last week at 160/95 but he did not call PCP. Yesterday am he checked bp as usual and it was the same with a heart rate of 40. He had no dizziness but has been sluggish. He went to work and then returned home and began having palpitatoins. He developed a mild chest pressure but had no presyncope or dizziness. He then presented to the emergency room yesterday with fatigue and not feeling well, ie, nausea and palpitations, chest pressure.     Blood pressure on admission was 166/109.  Ekg showed a fib with RVR and he converted at the end of the EKG. On admission heart rate was 150 and irregular and he was given a Cardizem bolus 25 mg. and also given aspirin.   WBCs were 11.4 with a hemoglobin of 16.6 and hematocrit of 48.7.  Troponin was 0.01 and TSH 1.67 with a proBNP of 106 and a magnesium of 2.1  His heart rate only came down to 120.  He was started on a Cardizem drip.  He was given Lovenox and admitted to intensive care. The chest pain resolved when his heart rate came down.      Past medical history  1. Non-smoker  2. Bradycardia  3. Hypertension  4. Obesity  5. Erectile dysfunction  6. Father with premature coronary artery disease and died at 1951 of a massive MI   417. 2D echocardiogram July 2018 stage I diastolic dysfunction and trace mitral regurgitation trace tricuspid regurgitation with right ventricular systolic pressure 30 mmHg and normal left  ventricular systolic function  8. Lexiscan stress test July 2018- for ischemic symptoms or ischemic EKG changes and no evidence of fixed or reversible defects and no wall motion abnormalities low probability of significant coronary artery disease  9. Chronic pain  10. Sulfa allergy  11.  GERD  12. Osteoarthritis  13. Urinary frequency  14. ? OSA but never tested    Medications Prior to admit:  Prior to Admission medications    Medication Sig Start Date End Date Taking? Authorizing Provider   omeprazole (PRILOSEC) 20 MG delayed release capsule Take 20 mg by mouth daily   Yes Historical Provider, MD   losartan-hydroCHLOROthiazide (HYZAAR) 100-25 MG per tablet Take 1 tablet by mouth daily 100-12.5 mg daily   Yes Historical Provider, MD   metoprolol succinate (TOPROL XL) 25 MG extended release tablet Take 25 mg by mouth daily   Yes Historical Provider, MD       Current Medications:    Current Facility-Administered Medications: atorvastatin (LIPITOR) tablet 20 mg, 20 mg, Oral, Nightly  perflutren lipid microspheres (DEFINITY) injection 1.65 mg, 1.5 mL, Intravenous, ONCE PRN  pantoprazole (PROTONIX) tablet 40 mg, 40 mg, Oral, QAM AC  losartan (COZAAR) tablet 100 mg, 100 mg, Oral, Daily **AND** hydroCHLOROthiazide (HYDRODIURIL) tablet 12.5 mg, 12.5 mg, Oral, Daily  enoxaparin (LOVENOX)  injection 100 mg, 100 mg, Subcutaneous, BID  metoprolol succinate (TOPROL XL) extended release tablet 25 mg, 25 mg, Oral, Nightly    Allergies:  Sulfa antibiotics    Social History: Non-smoker nondrinker and no illicit drugs      Family History: Father had premature coronary artery disease and died at 31 of a massive MI  No family history on file.    REVIEW OF SYSTEMS:     ?? Constitutional: Denies fever, chills or night sweats  ?? Eyes: Denies visual changes or drainage  ?? ENT: Denies headaches or hearing loss. No mouth sores or sore throat. No epistaxis   ?? Cardiovascular: No lower extremity swelling.   ?? Respiratory: Denies DOE, cough,  orthopnea or PND. No hemoptysis   ?? Gastrointestinal: Denies hematemesis or anorexia. No hematochezia or melena    ?? Genitourinary: Denies urgency, dysuria or hematuria.  ?? Musculoskeletal: Denies gait disturbance, weakness or joint complaints  ?? Integumentary: Denies rash, hives or pruritis   ?? Neurological: Denies dizziness, headaches or seizures. No numbness or tingling  ?? Psychiatric: Denies anxiety or depression.  ?? Endocrine: Denies temperature intolerance. No recent weight change. .  ?? Hematologic/Lymphatic: Denies abnormal bruising or bleeding. No swollen lymph nodes    PHYSICAL EXAM:   BP (!) 162/85    Pulse 61    Temp 98.3 ??F (36.8 ??C) (Oral)    Resp 20    Ht 5\' 8"  (1.727 m)    Wt 218 lb 11.1 oz (99.2 kg)    SpO2 94%    BMI 33.25 kg/m??   CONST:  Well developed, well nourished who appears of stated age. Awake, alert and cooperative. No apparent distress.   HEENT:   Head- Normocephalic, atraumatic   Eyes- Conjunctivae pink, anicteric  Throat- Oral mucosa pink and moist  Neck-  No stridor, trachea midline, no jugular venous distention. No carotid bruit.    CHEST: Chest symmetrical and non-tender to palpation. No accessory muscle use or intercostal retractions  RESPIRATORY: Lung sounds - clear throughout fields   CARDIOVASCULAR:     Heart Inspection- shows no noted pulsations  Heart Palpation- no heaves or thrills; PMI is non-displaced   Heart Ausculation- Regular rate and rhythm, no murmur. No s3, s4 or rub   PV: No lower extremity edema. No varicosities. Pedal pulses palpable, no clubbing or cyanosis   ABDOMEN: Soft, non-tender to light palpation. Bowel sounds present. No palpable masses no organomegaly; no abdominal bruit  MS: Good muscle strength and tone. No atrophy or abnormal movements.   GU: Deferred  SKIN: Warm and dry no statis dermatitis or ulcers   NEURO / PSYCH: Oriented to person, place and time. Speech clear and appropriate. Follows all commands. Pleasant affect     DATA:    ECG / Tele strips:  SR   Diagnostic:    No intake or output data in the 24 hours ending 09/05/19 1001    Labs:   CBC:   Recent Labs     09/04/19  1707   WBC 11.4   HGB 16.6*   HCT 48.7   PLT 277     BMP:   Recent Labs     09/04/19  1707 09/05/19  0544   NA 138 141   K 3.5 3.9   CO2 26 26   BUN 15 19   CREATININE 1.0 1.0   LABGLOM >60 >60   CALCIUM 9.3 8.8     Mag:   Recent Labs  09/04/19  1707   MG 2.1     Phos: No results for input(s): PHOS in the last 72 hours.  TSH:   Recent Labs     09/04/19  1707   TSH 1.670     HgA1c: No results found for: LABA1C  No results found for: EAG  proBNP:   Recent Labs     09/04/19  1707   PROBNP 106     PT/INR: No results for input(s): PROTIME, INR in the last 72 hours.  APTT:No results for input(s): APTT in the last 72 hours.  CARDIAC ENZYMES:  Recent Labs     09/04/19  1707 09/04/19  2305 09/05/19  0544   TROPONINI <0.01 <0.01 <0.01     FASTING LIPID PANEL:  Lab Results   Component Value Date    CHOL 177 09/05/2019    HDL 34 09/05/2019    LDLCALC 107 09/05/2019    TRIG 182 09/05/2019     LIVER PROFILE:No results for input(s): AST, ALT, LABALBU in the last 72 hours.    Electronically signed by Sherryll Burger, APRN - CNP on 09/05/2019 at 10:01 AM     Assessment and plan by Dr.Perris Tripathi    ______________________________________________________________________  I independently interviewed and examined the patient. I have reviewed the above documentation completed by the APP. Please see my additional contributions to the HPI, physical exam, and assessment / medical decision making.    HPI, ROS, PMH, PSH, FMH, SH, and medications independently reviewed (agree; see above documentation)    Review of Systems:  Cardiac: As per HPI  General: No fever, chills  Pulmonary: As per HPI  GI: No nausea, vomiting  Musculoskeletal: MAE x 4, no focal motor deficits  Skin: Intact, no rashes  Neuro/Psych: No headache or seizures    Physical Exam:  BP (!) 161/90    Pulse 63    Temp 98.1 ??F (36.7 ??C) (Oral)    Resp 19    Ht  5\' 8"  (1.727 m)    Wt 218 lb 11.1 oz (99.2 kg)    SpO2 95%    BMI 33.25 kg/m??   Appearance: Awake, alert, no acute respiratory distress  Skin: Intact, no rash  Head: Normocephalic, atraumatic  Neck: Supple, no carotid bruits  Lungs: Clear to auscultation bilaterally. No wheezes, rales, or rhonchi.  Cardiac: Regular rate and rhythm, +S1S2, no murmurs apparent  Abdomen: Soft, +bowel sounds  Extremities: Moves all extremities x 4, no lower extremity edema  Neurologic: No focal motor deficits apparent, normal mood and affect    Assessment/Plan:  73 year old male with past medical history of hypertension presents with palpitations and shortness of breath found to be in new onset atrial fibrillation with rapid ventricular response 130s beats per minute.  Heart rates converted to normal sinus rhythm after giving diltiazem  Injection.  Home dose metoprolol discontinued.  Patient's symptoms resolved after he went back to normal sinus rhythm.    Recommendations  We will increase metoprolol to 50 mg daily.  Apixaban 5 mg twice daily.  [place] vas score is 2.  Consider sleep study to evaluate obstructive sleep apnea given the patient's symptoms.  Obtain TTE.      Rubie Maid, MD  [facility]

## 2019-09-05 NOTE — Progress Notes (Signed)
Progress Note    History:  No chest pain or sense palpitations, no shortness of breath or cough. Rested well overnight. Appetite good awaiting breakfast, no abdominal complaints to ROS.    Patient's medical and surgical history, medications and allergies were reviewed. Interval notes of consultants, nurses, therapists, and ancillary services were also reviewed, as well as the medical administration record.    Physical Exam:  Vitals:    09/05/19 0400 09/05/19 0500 09/05/19 0600 09/05/19 0700   BP: 125/68 (!) 140/78 (!) 160/91 (!) 158/79   Pulse: 53 53 59 56   Resp: 14 16 17 14    Temp:       TempSrc:       SpO2: 95% 95% 94% 95%   Weight:       Height:         Afebrile, anicteric, sensorium clear. Lung auscultation clear, nonlabored respirations 8/min at rest. Heart regular rhythm presently, no S3S4 or rubs, tones fair, HR 56 range. Abd soft nontender, nondistended, active bowel noise. No edema BLE, circ intact, sl diminished pop and PT left compared to right.      Assessment/Plan:   1. AF with RVR  2. Hypertension  3. Dyslipidemia  4. OA, GERD, urinary frequency with trace hematuria    Discussed with the patient, troponin cycle negative, conversion to sinus rhythm, rate controlled on low dose BB. Resume ARB/diuretic this AM and trend bP's, echo ordered, anticoagulation broached. Follow for cardiology input. Start statin.  Electronically by Perrin Smack, DO  on 09/05/19 at 7:50 AM EDT

## 2019-09-05 NOTE — Plan of Care (Signed)
Problem: Activity:  Goal: Expression of feelings of increased energy will increase  Description: Expression of feelings of increased energy will increase  Outcome: Met This Shift  Note: "I can tell my heart rate is better, I don't have that 'feeling' anymore"     Problem: Coping:  Goal: Level of anxiety will decrease  Description: Level of anxiety will decrease  Outcome: Met This Shift  Goal: General experience of comfort will improve  Description: General experience of comfort will improve  Outcome: Met This Shift  Note: "I slept all night"     Problem: Safety:  Goal: Ability to remain free from injury will improve  Description: Ability to remain free from injury will improve  Outcome: Met This Shift  Goal: Will show no signs and symptoms of excessive bleeding  Description: Will show no signs and symptoms of excessive bleeding  Outcome: Met This Shift

## 2019-09-05 NOTE — H&P (Signed)
[facility]                    [address] [place] [address]                              HISTORY AND PHYSICAL    PATIENT NAME: Jeffrey Jeffrey [name], Jeffrey Jeffrey [name]                    DOB:        [birth year]  MED REC NO:   [id number]                            ROOM:       IC04  ACCOUNT NO:   [id number]                           ADMIT DATE: 09/04/2019  PROVIDER:     Burnard Leigh    HISTORY OF PRESENT ILLNESS:  73 year old male admitted through  emergency suite following presentation with heart pounding.  The patient  states he had an episode approximately two months prior to admission,  duration of two hours, of the sensation of his heart racing or pounding.   He may have had intermittent fleeting episodes of same since then.  The  day of admission, he had gone about his usual daily activities, in the mid  afternoon he began to have the sensation of his heart pounding and  racing.  He took his blood pressure which was elevated and noticed that  his pulse was in excess of 150 beats per minute.  He experienced some  degree of pressure type discomfort in addition to some shortness of  breath without cough or hemoptysis.  There was no particular peripheral  edema.  He denies any fevers, chills or rigors.  There has been no  change in medications or over-the-counter use.  He presented to  emergency room with evidence of atrial fibrillation with rapid  ventricular response present.  He did receive diltiazem IV and  subsequent admission was carried out.    PAST MEDICAL HISTORY:  Varicella in childhood, appendicitis, essential  hypertension, gastroesophageal reflux disease, degenerative joint  disease of lumbar spine, no history of diabetes, pneumonia, asthma.    PAST SURGICAL HISTORY:  Appendectomy at age 7, left abdominal hernia  repair at age 31, right hallux arthroscopic surgery for calcium deposit  unrecalled year.    ALLERGIES:  SULFA which causes rash.    MEDICATIONS:  Omeprazole 20 mg  daily, Toprol-XL 25 mg daily, losartan  HCT 100/25 daily.    FAMILY HISTORY:  Father deceased at 66 of myocardial infarction.  Mother  deceased at 43 of old age, one male sibling at 46 with multiple strokes.    REVIEW OF SYSTEMS:  CARDIOVASCULAR:  See above.  Admits to some  sensation of chest pressure without actual pain associated with chief  complaint, no paroxysmal nocturnal dyspnea, or orthopnea, no recent  peripheral edema, no history of myocardial infarction, rheumatic fever,  congenital heart disease, prior known history of essential hypertension  and bradycardia.  RESPIRATORY:  Denied cough or wheezing, no pneumonia  or asthma, no TB or exposure.  Positive skin test, shortness of breath  as in chief complaint.  GASTROINTESTINAL:  Prior history of  gastroesophageal reflux, weight has been stable,  no diarrhea,  constipation, melena, hematochezia or acholia, no nausea or vomiting.   GENITOURINARY:  Without incontinence or calculi, he does admit to recent  urinary frequency without hematuria or dysuria.  NEUROMUSCULAR:  No  seizures, syncope, paresthesias, dysesthesia, paralysis, paresis.    PHYSICAL EXAMINATION:  GENERAL APPEARANCE:  Jeffrey [name] 73 year old male.  He is alert and cooperative,  well-oriented x3.  Affect is normal as is content of conversation.   Sensorium is clear.  HEENT:  Head is normocephalic.  Scalp was of male-pattern recession.   Hair loss dermal surfaces revealed multiple lesions of solar exposure,  no cyanosis or icterus.  Eyes are bilaterally at 4 mm, round pupils.   Sclerae are white.  Conjunctive are satisfactory color.  Extraocular  muscles are intact without nystagmus.  Oropharynx is clear.  NECK:  Thyroid examination is normal.  Carotids are 1+ without bruits.  LUNGS:  Lung auscultation is clear, anterior and posterior, no rales or  wheezes appreciated.  Respirations are nonlabored.  HEART:  Presently is with regular rhythm at 76 per minute.  There is no  S3, S4, or rubs appreciated.  No  clicks or snaps, tones are slightly  diminished.  ABDOMEN:  Soft without distention.  There is mild discomfort in the left  periumbilical region without rebound or rigidity.  No organomegaly is  present, bowel sounds are active in all quadrants.  Femoral pulses are  1+.  EXTREMITIES:  Reveal satisfactory muscle strength and development.   Distal color and warmth is normal.  Deep tendon reflexes are normal at  1+ and equal.  Right popliteal, posterior tibial and dorsalis pedis  pulses are 2+, left popliteal is 1+ as is posterior tibial.  Dorsalis  pedis is 1 to 2+ on the left.  Gait and range of motion are not tested.    TENTATIVE DIAGNOSES:  1.  Atrial fibrillation with rapid ventricular response.  2.  Essential hypertension.  3.  History of gastroesophageal reflux disease.        Valrie HartJOHN Larrisha Babineau    D: 09/05/2019 10:09:11       T: 09/05/2019 10:22:44     [email or web address]  Job#: [id number]     Doc#: [id number]    CC:

## 2019-09-05 NOTE — Progress Notes (Deleted)
Educated the patient on his Bosutinib medication and provided him with printed material to take home.

## 2019-09-05 NOTE — Care Coordination-Inpatient (Signed)
Spoke with Tori from the [place], this patient is not in their system. Electronically signed by Kaylyn Layer on 09/05/2019 at 12:18 PM

## 2019-09-05 NOTE — Care Coordination-Inpatient (Signed)
9-11-Cm note: met with patient for transition of care needs, pt lives with his fiance Baker Janus , pt is from [place] , has [employer] secondary to [employer]. Pt is independent , drives , has no DME history. Pt plans on returning home at dc with no anticipated needs, his fiance will provide transportation . Electronically signed by Peyton Najjar, RN on 09/05/2019 at 10:12 AM

## 2019-09-06 LAB — PSA SCREENING: PSA: 1.47 ng/mL (ref 0.00–4.00)

## 2019-09-06 MED ORDER — ATORVASTATIN CALCIUM 20 MG PO TABS
20 MG | ORAL_TABLET | Freq: Every evening | ORAL | 2 refills | Status: AC
Start: 2019-09-06 — End: 2022-09-04

## 2019-09-06 MED ORDER — METOPROLOL SUCCINATE ER 25 MG PO TB24
25 MG | ORAL_TABLET | Freq: Two times a day (BID) | ORAL | 2 refills | Status: DC
Start: 2019-09-06 — End: 2021-04-28

## 2019-09-06 MED ORDER — APIXABAN 5 MG PO TABS
5 MG | ORAL_TABLET | Freq: Two times a day (BID) | ORAL | 2 refills | Status: DC
Start: 2019-09-06 — End: 2019-10-06

## 2019-09-06 MED FILL — LOSARTAN POTASSIUM 100 MG PO TABS: 100 mg | ORAL | Qty: 1

## 2019-09-06 MED FILL — METOPROLOL SUCCINATE ER 25 MG PO TB24: 25 mg | ORAL | Qty: 1

## 2019-09-06 MED FILL — ELIQUIS 5 MG PO TABS: 5 mg | ORAL | Qty: 1

## 2019-09-06 MED FILL — HYDROCHLOROTHIAZIDE 12.5 MG PO TABS: 12.5 mg | ORAL | Qty: 1

## 2019-09-06 MED FILL — ATORVASTATIN CALCIUM 20 MG PO TABS: 20 mg | ORAL | Qty: 1

## 2019-09-06 MED FILL — PANTOPRAZOLE SODIUM 40 MG PO TBEC: 40 mg | ORAL | Qty: 1

## 2019-09-06 NOTE — Progress Notes (Signed)
Progress Note    History:  Episode feeling 'out of it' after receiving three pills (stated stomach, cholesterol, blood thinner) early this AM on empty stomach, lasted a while, resolved after some ginger ale and a cracker. Now feels fine. This sequence of events not supported by [facility], unable to reconcile this issue, staff involved not on premises.     Denies chest pain or palpitations, no new shortness of breath or cough. Since above episode is tolerating in room physical activity well. No dizziness, headache, or lightheadedness.    Patient's medical and surgical history, medications and allergies were reviewed. Interval notes of consultants, nurses, therapists, and ancillary services were also reviewed, as well as the medical administration record.    Physical Exam:  Vitals:    09/05/19 1115 09/05/19 1705 09/05/19 2039 09/06/19 0730   BP: (!) 161/90 (!) 157/80 131/81 (!) 148/83   Pulse: 63 63 68 60   Resp: _0 Temp: 98.1 ??F (36.7 ??C) 97.6 ??F (36.4 ??C) 98.4 ??F (36.9 ??C) 97.9 ??F (36.6 ??C)   TempSrc: Oral Oral Oral Oral   SpO2: 95% 95% 96% 96%   Weight:       Height:         Sensorium clear/intact high cerebral, speech and content of conversation is normal, central and peripheral neurologic normal. Lungs clear nonlabored. Heart regular rhythm, no ectopy heard, tones same, no S3S4, HR 64/min. No edema lower extremities.      Assessment/Plan:   1. Atrial fib w RVR  2. Ess hypertension, MAC  3. GERD  4. Dyslipidemia  5. OA  6. Urinary frequency with trace hematuria    Discussed with the patient, BP trend noted elevated, now on BID beta blockade and DOAC, with ARB/thiazide diuretic. Echo reveals mitral annular Ca++ otherwise negative. Allow discharge if OK with cardiology, monitor BP at home and augment med tx if BP's remain up. Risks/benefits anticoagulation reiterated. Follow urinary symptoms OP basis as to final culture and prostate.     Electronically by Perrin Smack, DO  on 09/06/19 at 9:43 AM EDT

## 2019-09-06 NOTE — Discharge Summary (Signed)
[facility]                    [address] [place], [place] [id number]                               DISCHARGE SUMMARY    PATIENT NAME: [name], Jeffrey A                    DOB:        04-09-1946  MED REC NO:   [id number]                            ROOM:       0438  ACCOUNT NO:   [id number]                           ADMIT DATE: 09/04/2019  PROVIDER:     Valrie HartJohn Sharesa Kemp                  DISCHARGE DATE: 09/06/2019    HOSPITAL COURSE:  This 73 year old male was admitted from the emergency  room following presentation with rapid heart rate associated with  worsening shortness of breath.  Evaluation in the emergency room  revealed atrial fibrillatory rhythm with rapid ventricular response.  He was treated with intravenous diltiazem, followed by  diltiazem intravenous drip.  Admission was carried out to the inpatient  facility.    Monitored bed setting was utilized for continued cardiac evaluation.   Cycled cardiac enzymes were negative for acute ischemic or injurious  change.  He was seen in consultation by Dr. Arnette NorrisBarakat, his outpatient  attending cardiologist.  Beta-blockade dosages were increased along with  the establishment of anticoagulation.  Prior antihypertensives otherwise  were maintained, as was proton pump inhibition per his preadmission  prescriptions.  Echocardiography was performed which revealed a  satisfactory study, including ejection fraction, cardiac chamber  evaluation, and valvular structures other than mitral annular  calcification.  He was started on atorvastatin for dyslipidemic  cholesterol profile exhibited by decreased HDL and mildly increased LDL  values.  Urinary frequency was evaluated with culture and sensitivity,  same being negative by the time of discharge.  PSA values also remained  outstanding at discharge.  No further cardiac event was present during  inpatient stay with the exception of persistently elevated blood  pressure values.    At the time of  discharge on 09/12, the patient was in improved and  stable clinical condition.  Full instructions were given at this time  for his further care on an outpatient basis.  Recheck will be in the  outpatient office in 7-10 days post discharge.  Aforementioned urinary  results will be followed from the office in addition to potential  urologic consultation.  Appropriate risks and side effect discussion  regarding anticoagulation was held with the patient during this hospital  stay.  At discharge, he will maintain Toprol-XL 25 mg b.i.d., Eliquis 5  mg b.i.d., losartan 100/HCT 12.5 daily, and omeprazole 20 mg daily.    DISCHARGE DIAGNOSES:  1.  Atrial fibrillation with rapid ventricular response.  2.  Essential hypertension, mitral annular calcification.  3.  Dyslipidemia.  4.  Gastroesophageal reflux disease.  5.  Osteoarthritis.          [employer] CompanyJOHN Ricci Dirocco  D: 09/07/2019 13:34:49       T: 09/07/2019 13:38:41     [email or web address]  Job#: [id number]     Doc#: [id number]    CC:

## 2019-09-06 NOTE — Progress Notes (Signed)
[facility]        CARDIOLOGY                 INPATIENT PROGRESS NOTE          PATIENT SEEN IN FOLLOW UP FOR: A fib    Hospital Day: 3     Jeffrey [name] is a 73 year old patient known to Dr. Shawnie Pons      SUBJECTIVE: Denies any CP or SOB, No palpitations or dizzy    ROS: Review of rest of 8 systems negative except as mentioned above    OBJECTIVE: No acute distress.  See Assessment     Diagnostics:       Telemetry: Sinus         Intake/Output Summary (Last 24 hours) at 09/06/2019 0920  Last data filed at 09/06/2019 1607  Gross per 24 hour   Intake 600 ml   Output --   Net 600 ml       Labs:   CBC:   Recent Labs     09/04/19  1707 09/05/19  0553   WBC 11.4 7.6   HGB 16.6* 14.8   HCT 48.7 43.4   PLT 277 240     BMP:   Recent Labs     09/04/19  1707 09/05/19  0544   NA 138 141   K 3.5 3.9   CO2 26 26   BUN 15 19   CREATININE 1.0 1.0   LABGLOM >60 >60   CALCIUM 9.3 8.8     Mag:   Recent Labs     09/04/19  1707   MG 2.1     Phos: No results for input(s): PHOS in the last 72 hours.  TSH:   Recent Labs     09/04/19  1707   TSH 1.670     HgA1c:     BNP: No results for input(s): BNP in the last 72 hours.  PT/INR: No results for input(s): PROTIME, INR in the last 72 hours.  APTT:No results for input(s): APTT in the last 72 hours.  CARDIAC ENZYMES:  Recent Labs     09/04/19  1707 09/04/19  2305 09/05/19  0544   TROPONINI <0.01 <0.01 <0.01     FASTING LIPID PANEL:  Lab Results   Component Value Date    CHOL 177 09/05/2019    HDL 34 09/05/2019    LDLCALC 107 09/05/2019    TRIG 182 09/05/2019     LIVER PROFILE:No results for input(s): AST, ALT, LABALBU in the last 72 hours.    Current Inpatient Medications:  ??? atorvastatin  20 mg Oral Nightly   ??? metoprolol succinate  25 mg Oral BID   ??? apixaban  5 mg Oral BID   ??? pantoprazole  40 mg Oral QAM AC   ??? losartan  100 mg Oral Daily    And   ??? hydroCHLOROthiazide  12.5 mg Oral Daily       IV Infusions (if any):        PHYSICAL EXAM:     CONSTITUTIONAL:   BP (!)  148/83    Pulse 60    Temp 97.9 ??F (36.6 ??C) (Oral)    Resp 16    Ht 5\' 8"  (1.727 m)    Wt 218 lb 11.1 oz (99.2 kg)    SpO2 96%    BMI 33.25 kg/m??   Pulse  Avg: 63  Min: 60  Max: 68  Systolic (37TGG), YIR:485 ,  Min:131 , Max:161    Diastolic (24hrs), Avg:82, Min:78, Max:90    In general, this is a well developed, well nourished who appears stated age. awake, alert, cooperative, no apparent distress  HEENT: eyes -conjunctivae pink,  Throat - Oral mucosa pink and moist.   Neck-  no stridor, no noted enlargement of the thyroid, no carotid bruit. no tenderness, no jugular venous distention   RESPIRATORY: Chest symmetrical and non-tender to palpation.  No accessory muscle use.   Lung auscultation - clear to auscultation except few rhonchi  CARDIOVASCULAR:     Heart Inspection shows no noted pulsations  Heart Palpation - no palpable thrills   Heart Ausculation - Regular rate and rhythm, 1/6 systolic murmur, No s3 or rub.   No lower extremity edema, no varicosities. Distal pulses palpable, no clubbing or cyanosis   ABDOMEN: Obese. Bowel sounds present.  MS: good muscle strength and tone.  GU: Deferred  Rectal Exam: Deferred  SKIN: warm and dry no statis dermatitis or ulcers   NEURO / PSYCH: oriented to person, place        ASSESSMENT/PLAN:     Atrial fibrillation with RVR (HCC) - NEW, CHA2DS2 VASc score 2- In Sinus On Eliquis for OAC  - Risk-benefits of OAC discussed    Essential HTN - Controlled    Dyslipidmeia - on Statin    GERD    Non morbid obesity - Out-pt sleep study to r/o OSA          Ok to discharge home  F/u by Dr Arnette NorrisBarakat 1-2 weeks        Electronically signed by Beverely Risenhakri Ludmilla Mcgillis, MD on 09/06/2019 at 9:20 AM  [facility]

## 2019-09-06 NOTE — Discharge Instructions (Signed)

## 2019-09-06 NOTE — Plan of Care (Signed)
Problem: Activity:  Goal: Ability to tolerate increased activity will improve  Description: Ability to tolerate increased activity will improve  Outcome: Met This Shift     Problem: Coping:  Goal: General experience of comfort will improve  Description: General experience of comfort will improve  Outcome: Met This Shift

## 2019-09-07 LAB — CULTURE, URINE

## 2019-09-15 ENCOUNTER — Ambulatory Visit: Admit: 2019-09-15 | Discharge: 2019-09-15 | Payer: MEDICARE | Attending: Internal Medicine | Primary: Family Medicine

## 2019-09-15 DIAGNOSIS — I10 Essential (primary) hypertension: Secondary | ICD-10-CM

## 2019-09-15 NOTE — Progress Notes (Signed)
[facility] Progress Note  Dr. Rolan BuccoUrwa Lane Eland      Referring Physician: Burton ApleyJohn C Baker, DO  CHIEF COMPLAINT:   Chief Complaint   Patient presents with   ??? Hypertension     Post hospital from [facility] with AF .  PATient denies any CP.  Patient has SOB.         HISTORY OF PRESENT ILLNESS:   Patient is 73 years old male with history of hypertension and bradycardia. Here for follow up appointment.  He feels a lot better after adjusting his medications, had one episode of rapid heart rate couple of days ago that lasted for less than an hour resolved spontaneously, patient denies any chest pain, no shortness of breath, no lightheadedness, no dizziness, no palpitations, no pedal edema, no PND, no orthopnea, no syncope, no presyncopal episodes.  Functional capacity is good for age      Past Medical History:   Diagnosis Date   ??? Arrhythmia    ??? Hyperlipidemia    ??? Hypertension          Past Surgical History:   Procedure Laterality Date   ??? APPENDECTOMY     ??? HERNIA REPAIR           Current Outpatient Medications   Medication Sig Dispense Refill   ??? losartan (COZAAR) 100 MG tablet Take 100 mg by mouth daily     ??? hydroCHLOROthiazide (MICROZIDE) 12.5 MG capsule Take 12.5 mg by mouth daily     ??? apixaban (ELIQUIS) 5 MG TABS tablet Take 1 tablet by mouth 2 times daily 60 tablet 2   ??? atorvastatin (LIPITOR) 20 MG tablet Take 1 tablet by mouth nightly 30 tablet 2   ??? metoprolol succinate (TOPROL XL) 25 MG extended release tablet Take 1 tablet by mouth 2 times daily 60 tablet 2   ??? omeprazole (PRILOSEC) 20 MG delayed release capsule Take 20 mg by mouth daily       No current facility-administered medications for this visit.          Allergies as of 09/15/2019 - Review Complete 09/15/2019   Allergen Reaction Noted   ??? Sulfa antibiotics Rash 09/04/2019       Social History     Socioeconomic History   ??? Marital status: Widowed     Spouse name: Not on file   ??? Number of children: Not on file   ??? Years of education: Not on file   ??? Highest  education level: Not on file   Occupational History   ??? Not on file   Social Needs   ??? Financial resource strain: Not on file   ??? Food insecurity     Worry: Not on file     Inability: Not on file   ??? Transportation needs     Medical: Not on file     Non-medical: Not on file   Tobacco Use   ??? Smoking status: Never Smoker   ??? Smokeless tobacco: Never Used   Substance and Sexual Activity   ??? Alcohol use: Not Currently     Frequency: Never     Comment: 3 cups of Decaf   ??? Drug use: Never   ??? Sexual activity: Not on file   Lifestyle   ??? Physical activity     Days per week: Not on file     Minutes per session: Not on file   ??? Stress: Not on file   Relationships   ??? Social connections  Talks on phone: Not on file     Gets together: Not on file     Attends religious service: Not on file     Active member of club or organization: Not on file     Attends meetings of clubs or organizations: Not on file     Relationship status: Not on file   ??? Intimate partner violence     Fear of current or ex partner: Not on file     Emotionally abused: Not on file     Physically abused: Not on file     Forced sexual activity: Not on file   Other Topics Concern   ??? Not on file   Social History Narrative   ??? Not on file       Family History   Problem Relation Age of Onset   ??? Heart Attack Father        REVIEW OF SYSTEMS:     CONSTITUTIONAL:  negative for  fevers, chills, sweats and fatigue  HEENT:  negative for  tinnitus, earaches, nasal congestion and epistaxis  RESPIRATORY:  negative for  dry cough, cough with sputum,wheezing and hemoptysis  GASTROINTESTINAL:  negative for nausea, vomiting, diarrhea, constipation, pruritus and jaundice  HEMATOLOGIC/LYMPHATIC:  negative for easy bruising, bleeding, lymphadenopathy and petechiae  ENDOCRINE:  negative for heat intolerance, cold intolerance, tremor, hair loss and diabetic symptoms including neither polyuria nor polydipsia nor blurred vision  MUSCULOSKELETAL:  negative for  myalgias,  arthralgias, joint swelling, stiff joints and decreased range of motion  NEUROLOGICAL:  negative for memory problems, speech problems, visual disturbance, dysphagia, weakness and numbness      PHYSICAL EXAM:   Constitutional:  Awake, alert cooperative, no apparent distress, and appears stated age.   HEENT:  Moist and pink mucous membranes, normocephalic, without obvious abnormality, atraumatic, normal ears and nose.   NECK:  Supple, symmetrical, trachea midline, no JVD, no adenopathy, thyroid symmetric, not enlarged and no tenderness, good carotid upstroke bilaterally, no carotid bruit, skin normal.   LUNGS: No increased work of breathing, good air exchange, clear to auscultation bilaterally, no crackles or wheezing.  Cardiovascular: Normal apical impulse, regular rate and rhythm, normal S1 and S2, no S3 or S4, 2/6 systolic murmur at the apex, 2/6 systolic murmur at the left lower sternal border, no pedal edema, good carotid upstroke bilaterally, no carotid bruit, no JVD, no abdominal pulsating masses.   ABDOMEN: Obese, Soft, nontender, no hepatomegaly, no splenomegaly, bowel sound positive.   CHEST:  Expands symmetrically, nontender to palpation.  Musculoskeletal:  No clubbing or cyanosis.  No redness, warmth, or swelling of the joints.  Neurological: Alert, awake, and oriented X3.   SKIN: No bruises, no bleeding, normal skin color, texture, turgor and no redness, warmth or swelling.      BP 134/80 (Site: Right Upper Arm, Position: Sitting, Cuff Size: Large Adult)    Pulse 60    Ht 5\' 8"  (1.727 m)    Wt 227 lb (103 kg)    BMI 34.52 kg/m??     DATA:   I personally reviewed the visit EKG with the following interpretation: Sinus rhythm    EKG 09/04/19 Atrial fibrillation with rapid ventricular response  Sinus rhythm toward the end of the ECG strip  Left axis deviation  Nonspecific ST abnormality  No previous ECGs available    ECHO: 09/05/19 Summary   Technically difficult examination. Enhancing contrast was used to  define   endocardial borders.   Normal left ventricular chamber  size.   Normal left ventricular systolic function.   Visually estimated LVEF is 60-65 %.   No wall motion abnormalities.   Normal right ventricle structure and function.   No significant valvular abnormalities.   No comparison study available.    Stress Test: Cardiac Testing:  EKG - (06/22/2017) Sinus bradycardia.  Normal ECG.  Echocardiogram - (06/26/2017) Normal left ventricular systolic function with stage I diastolic dysfunction.  Trace mitral valve regurgitation.  Trace tricuspid valve regurgitation with RVSP of 30 mmHg.  Stress Test - (07/05/2017) Negative Lexiscan stress test for ischemic symptoms or  ischemic EKG changes.  Normal Cardiolite perfusion scan without evidence of fixed or reversible defect.  Normal left ventricular systolic function with normal wall motion.  There is no old comparison study.  The results of the study predict low probability for significant coronary artery disease or future cardiac events.    Angiography:    Cardiology Labs: BMP:    Lab Results   Component Value Date    NA 141 09/05/2019    K 3.9 09/05/2019    K 3.5 09/04/2019    CL 101 09/05/2019    CO2 26 09/05/2019    BUN 19 09/05/2019    CREATININE 1.0 09/05/2019     CMP:    Lab Results   Component Value Date    NA 141 09/05/2019    K 3.9 09/05/2019    K 3.5 09/04/2019    CL 101 09/05/2019    CO2 26 09/05/2019    BUN 19 09/05/2019    CREATININE 1.0 09/05/2019     CBC:    Lab Results   Component Value Date    WBC 7.6 09/05/2019    RBC 4.93 09/05/2019    HGB 14.8 09/05/2019    HCT 43.4 09/05/2019    MCV 88.0 09/05/2019    RDW 13.3 09/05/2019    PLT 240 09/05/2019     PT/INR:  No results found for: PTINR  PT/INR Warfarin:  No components found for: PTPATWAR, PTINRWAR  PTT:  No results found for: APTT  PTT Heparin:  No components found for: APTTHEP  Magnesium:    Lab Results   Component Value Date    MG 2.1 09/04/2019     TSH:    Lab Results   Component Value Date    TSH  1.670 09/04/2019     TROPONIN:  No components found for: TROP  BNP:  No results found for: BNP  FASTING LIPID PANEL:    Lab Results   Component Value Date    CHOL 177 09/05/2019    HDL 34 09/05/2019    TRIG 202 09/05/2019     No orders to display     I have personally reviewed the laboratory, cardiac diagnostic and radiographic testing as outlined above:      IMPRESSION:  1: Bradycardia, unspecified  :  Resolved after adjusting medication               2: Paroxysmal atrial fibrillation: Now in sinus rhythm, continue current treatment, continue chronic anticoagulation with Eliquis    3: Essential (primary) hypertension  :   controlled, continue current treatment.              4: Family history of ischemic heart disease and other diseases of the circulatory system :   Father died of massive heart attack at age 35          5: Chronic anticoagulation  RECOMMENDATIONS:   1.  Continue current treatment  2.  Increase risk of bleeding due to being on anti-coagulation, symptoms and signs of bleeding discussed with patient, patient was advised to seek medical attention at the earliest symptoms or signs of bleeding.  3.  Preventive cardiology: Low-salt, low-cholesterol diet, daily exercise, were all advised.  4.  Follow-up with Dr. Luana Shu as scheduled  5.  Follow-up with Dr. Shawnie Pons in 6 months, sooner if symptomatic for any reason    I have reviewed my findings and recommendations with patient    Electronically signed by Claudean Kinds, MD on 09/15/2019 at 1:22 PM    NOTE: This report was transcribed using voice recognition software. Every effort was made to ensure accuracy; however, inadvertent computerized transcription errors may be present  Please see scanned note in epic

## 2019-10-06 MED ORDER — APIXABAN 5 MG PO TABS
5 MG | ORAL_TABLET | Freq: Two times a day (BID) | ORAL | 0 refills | Status: DC
Start: 2019-10-06 — End: 2020-08-20

## 2019-12-05 IMAGING — DX CHEST PA AND LATERAL
1 series · 2 of 2 positions shown · non-contrast
Comparison: Chest x-ray 05/14/2019

CLINICAL INDICATION: Shortness of breath.

[Series 1: PA · U · 0.17mm/px · 2 of 2 slices shown]
[im 1/2]
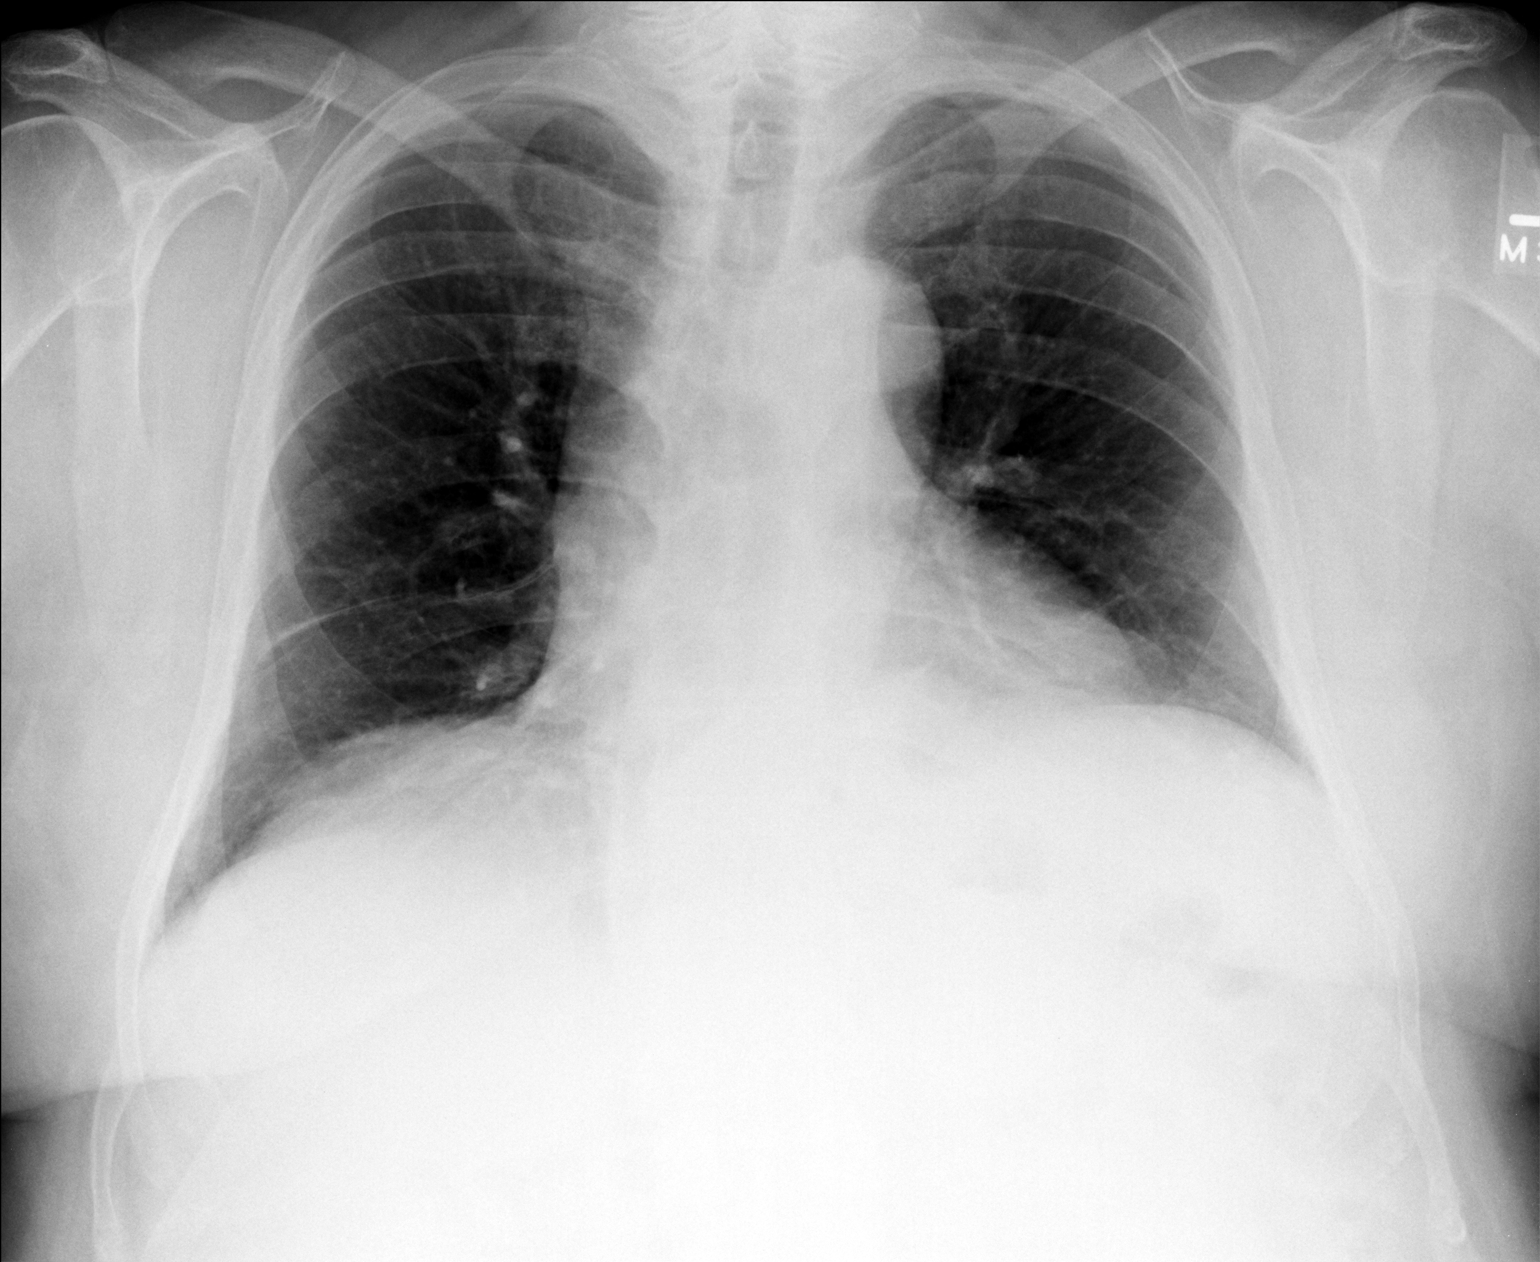
[im 2/2]
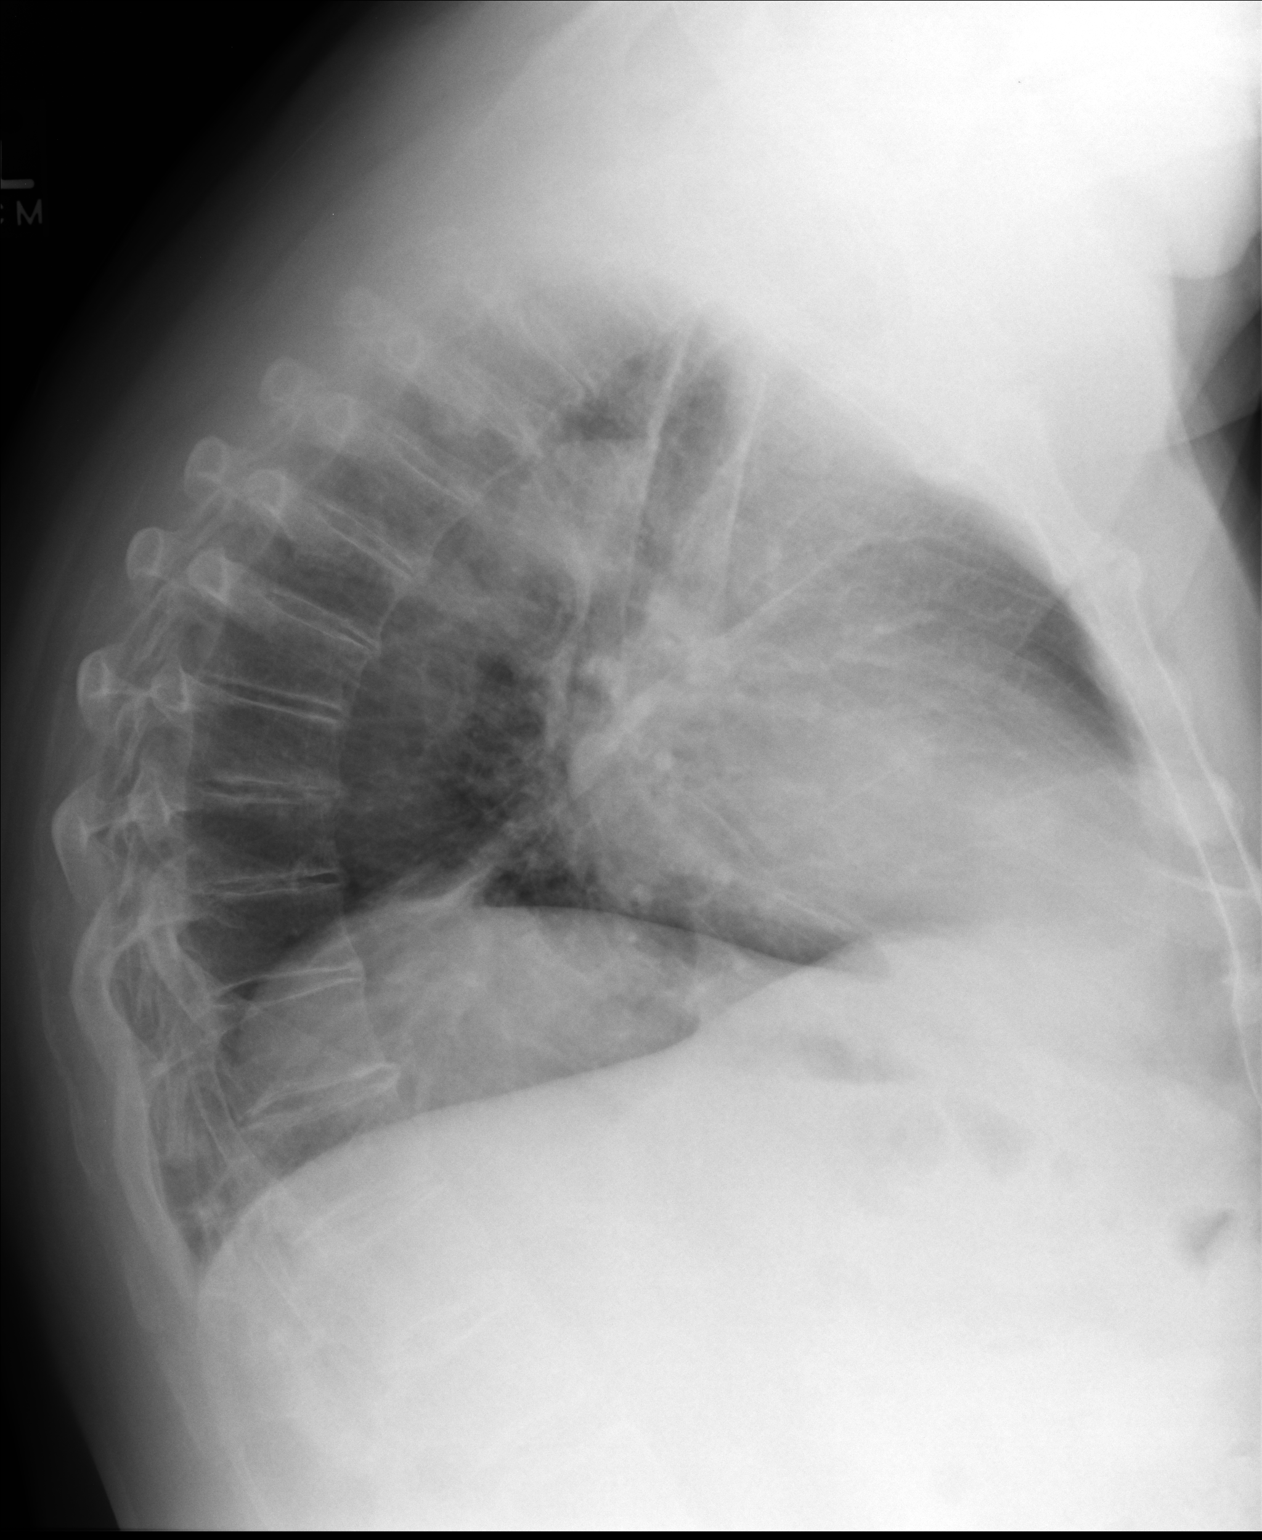

[2 of 2 positions shown; findings below may reference images not displayed]

FINDINGS: Limited inspiratory effort. No acute consolidation or effusion. Stable heart and 
mediastinum.
IMPRESSION: No acute consolidation or effusion. Stable chest.

## 2020-01-28 IMAGING — CT CTA CHEST
3 series · 13 of 46 positions shown, 16 images · IV contrast (APPLIED)
Comparison: Chest radiograph 09/05/2019 within the last 12 months.

CTA CHEST, 01/28/2020 [DATE]: 
CLINICAL INDICATION: Three month history worsening shortness of breath. 
Considering potential pulmonary embolism. 
A search for DICOM formatted images was conducted for prior CT imaging studies 
completed at a non-affiliated media free facility.
TECHNIQUE: The chest was scanned from base of neck through the lung bases with 
100 cc's of Isovue 370 injected intravenously on a high resolution low dose CT 
scanner using dose reduction techniques.  Routine MPR and MIP 3D renderings were 
reconstructed on an independent workstation with concurrent physician 
supervision.

[Series 4: pe chest 2.0 i31s 3 · axial · 0.85mm/px · z∈[-268,-46]mm · 9 of 129 slices shown, 12 images]
[im 9/129  soft-tissue]
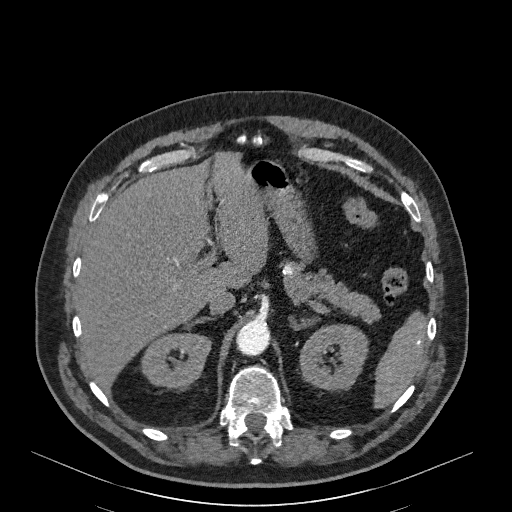
[im 9/129  bone]
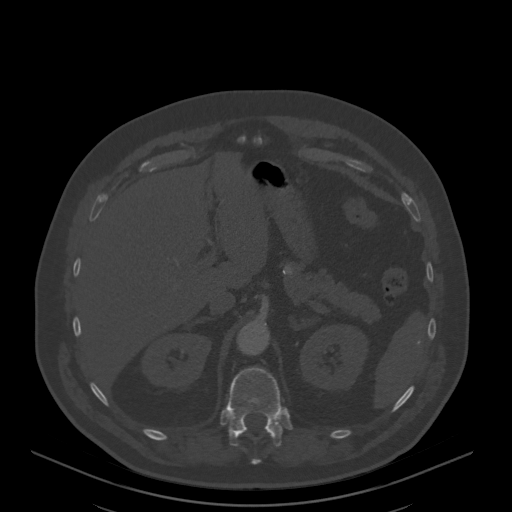
[im 25/129  soft-tissue]
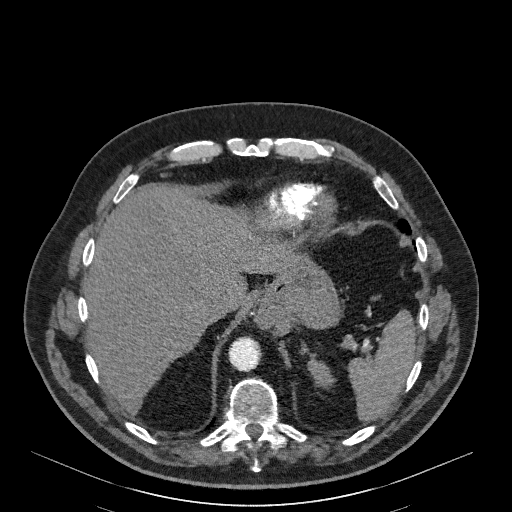
[im 38/129  soft-tissue]
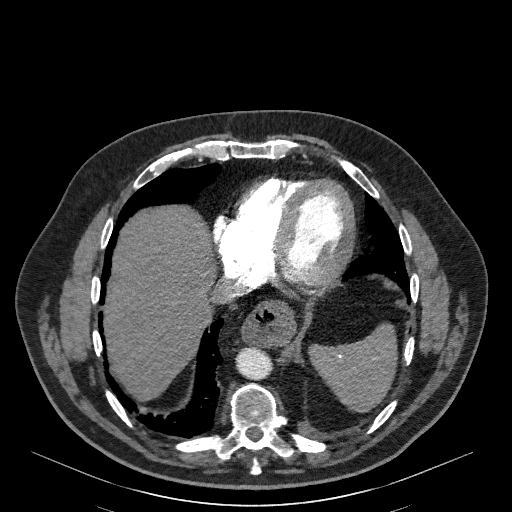
[im 50/129  soft-tissue]
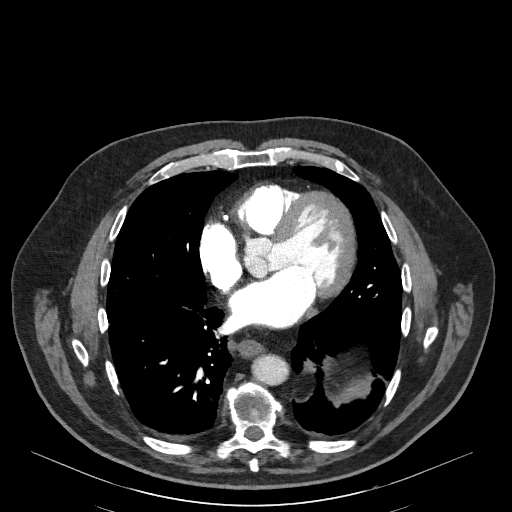
[im 67/129  soft-tissue]
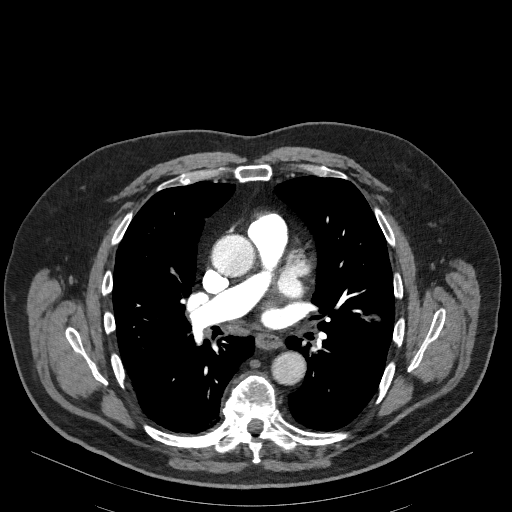
[im 67/129  bone]
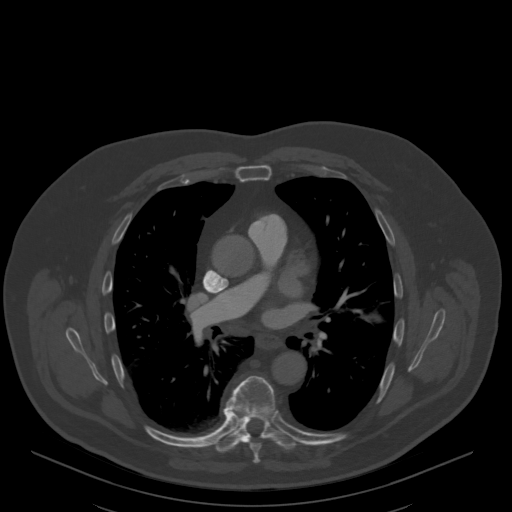
[im 79/129  soft-tissue]
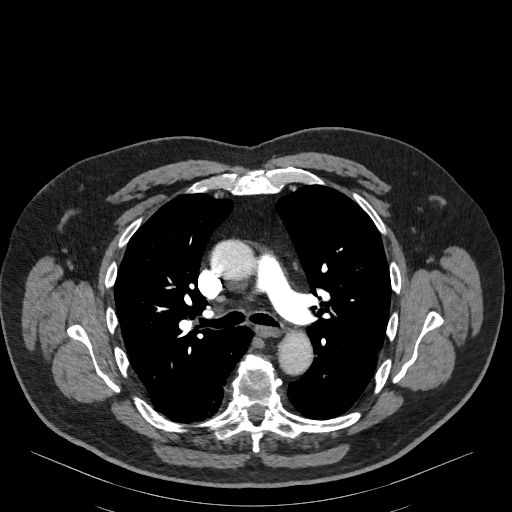
[im 91/129  soft-tissue]
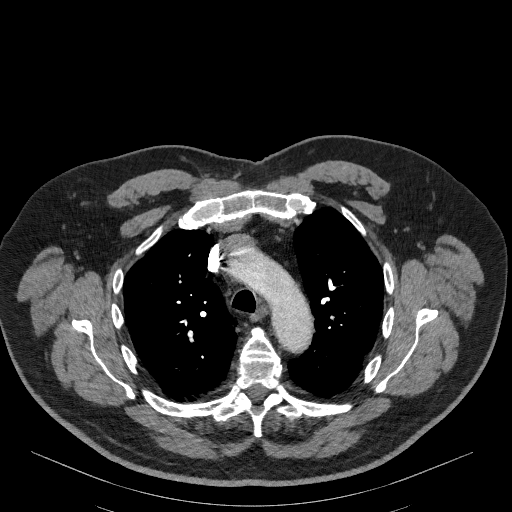
[im 108/129  soft-tissue]
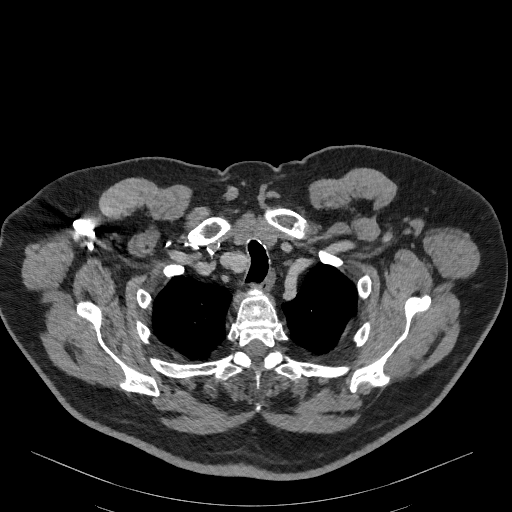
[im 120/129  soft-tissue]
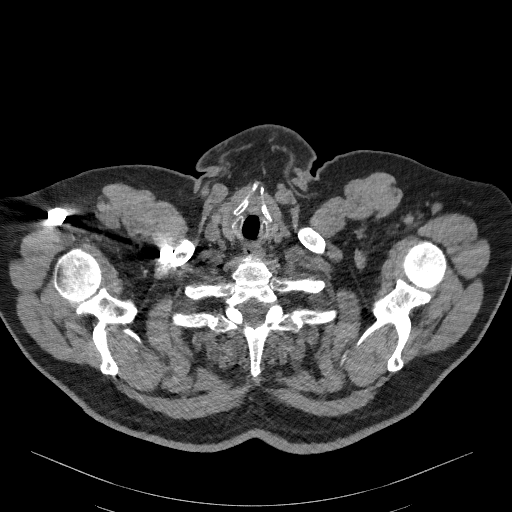
[im 120/129  bone]
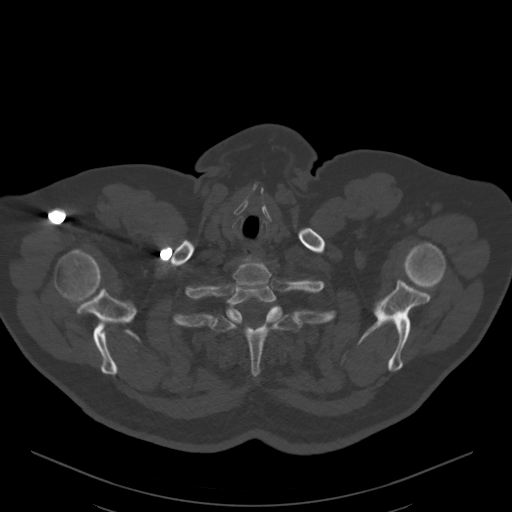

[Series 6: coronal · coronal · 0.51mm/px · 3 of 155 slices shown]
[im 52/155  soft-tissue]
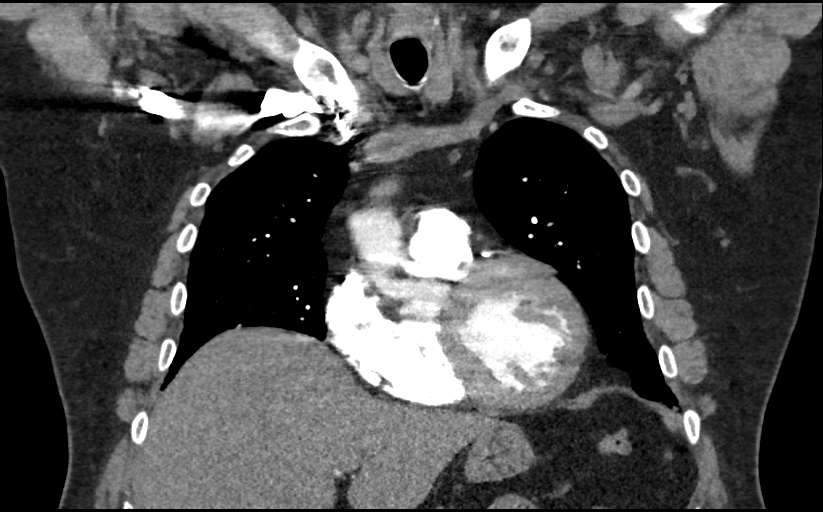
[im 69/155  soft-tissue]
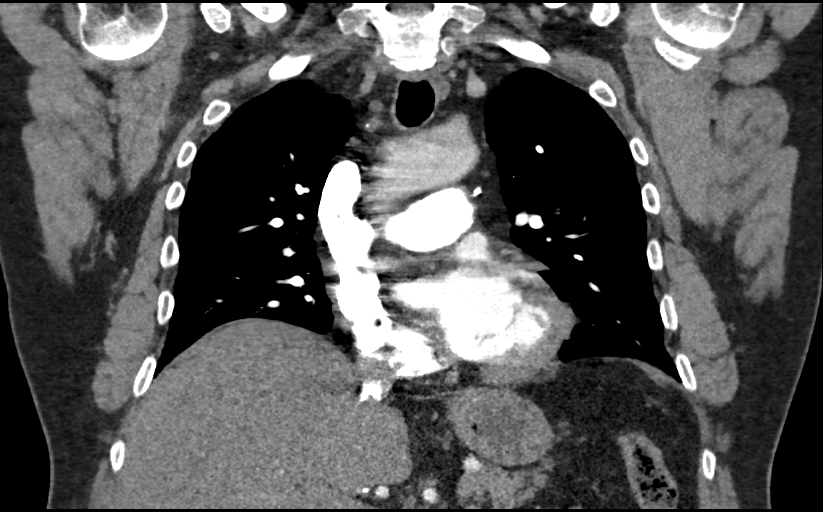
[im 86/155  soft-tissue]
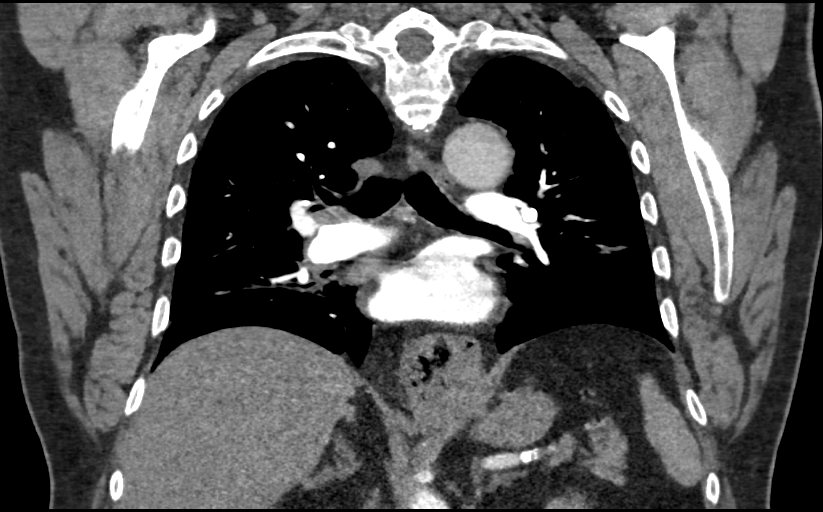

[Series 7: sagittal · sagittal · 0.52mm/px · 1 of 135 slices shown]
[im 45/135  soft-tissue]
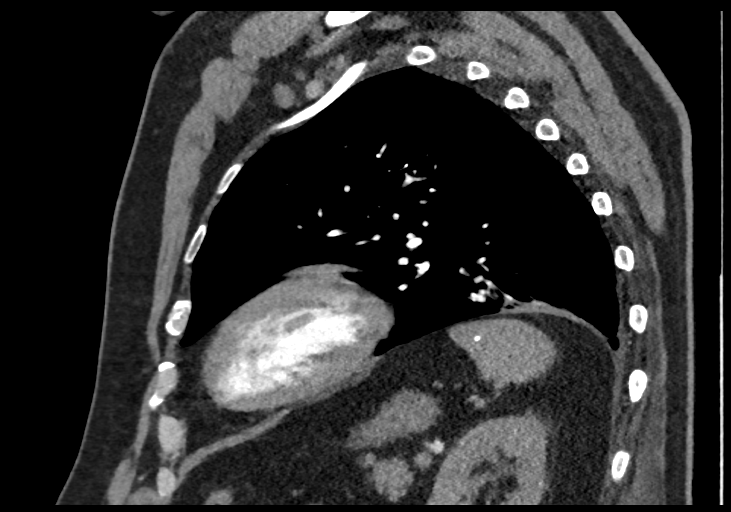

[13 of 46 positions shown; findings below may reference images not displayed]

FINDINGS: Caliber of the main pulmonary artery is normal. There are no visible pulmonary 
emboli. The heart is mildly enlarged. Severe coronary artery calcifications are 
present. There is no pericardial effusion. There are no significant 
abnormalities of the thoracic aorta. There is a moderate hiatal hernia. 
Esophagus is unremarkable. No mediastinal or hilar lymphadenopathy is detected. 
Both lung volumes are small most likely as a result of the moderate to severe 
degree of thoracic kyphosis and obesity. Bilateral subsegmental atelectasis is 
present. There is also evidence of inhomogeneous ventilation especially in the 
RIGHT lower lobe suggesting otherwise occult small airways disease. There is 
mild diffuse bronchial wall thickening suggesting diffuse bronchitis. No 
definite endobronchial secretions are found. There is no evidence for pneumonia, 
emphysema or diffuse interstitial lung disease. There are no suspicious lung 
nodules or mass lesions. No pleural effusion. 
There is a moderate to severe degree of thoracic kyphosis. Degenerative changes 
are in the thoracic spine as well as some mild anterior wedge deformities which 
appear chronic. No axillary lymphadenopathy is detected. Limited imaging of the 
inferior neck shows no significant incidental findings. Limited imaging of the 
upper abdomen shows granulomatous calcifications in the spleen. A partially 
visible LEFT renal cyst is present.
IMPRESSION: 1. No evidence for pulmonary embolism or sequela of past pulmonary embolism. 
2. Small lung volumes/hypoventilation potentially related to thoracic kyphosis 
and obesity. 
3. Evidence of mild diffuse bronchitis. No evidence for pneumonia. There is 
evidence of inhomogeneous ventilation of the lungs especially the RIGHT lower 
lobe suggesting otherwise occult small airways disease. 
4. Severe coronary artery calcifications. 
RADIATION DOSE REDUCTION: All CT scans are performed using radiation dose 
reduction techniques, when applicable.  Technical factors are evaluated and 
adjusted to ensure appropriate moderation of exposure.  Automated dose 
management technology is applied to adjust the radiation doses to minimize 
exposure while achieving diagnostic quality images.

## 2020-06-02 IMAGING — CT CT CHEST WITH CONTRAST
2 of 3 series · 15 of 36 positions shown, 18 images · IV contrast (isovue)
Comparison: Report from CTA chest 05/20/2020 [facility] 
reporting bilateral multi lobar airspace opacities compatible with multifocal 
pneumonia, no PE, cardiomegaly and mild pulmonary edema with tiny pleural 
effusions, CTA chest 01/28/2020, chest x-ray 12/05/2019

CT CHEST WITH CONTRAST, 06/02/2020 [DATE]: 
CLINICAL INDICATION:  Cardiac ablation 05/18/2020, chest pressure and cough since 
procedure, assess for CHF 
A search for DICOM formatted images was conducted for prior CT imaging studies 
completed at a non-affiliated media free facility.
TECHNIQUE: The chest was scanned from base of neck through the lung bases with 
566cc of Isovue 300 injected intravenously on a high resolution low dose CT 
scanner. Routine MPR and MIP 3D renderings were reconstructed on an independent 
workstation with concurrent physician supervision.

[Series 5: pe chest 2.0 i31s 3 · axial · 0.89mm/px · z∈[-111,+135]mm · 12 of 145 slices shown, 15 images]
[im 11/145  mediastinal]
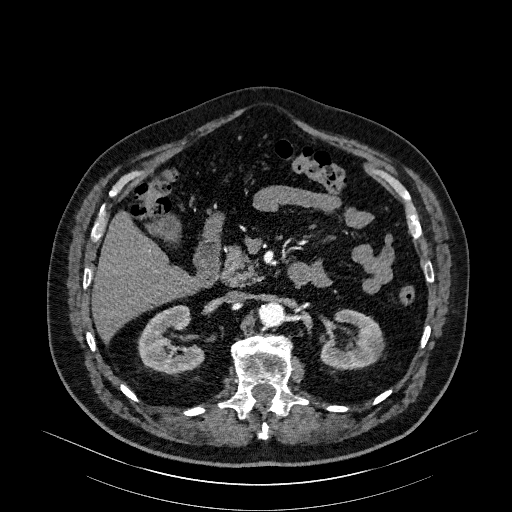
[im 11/145  lung]
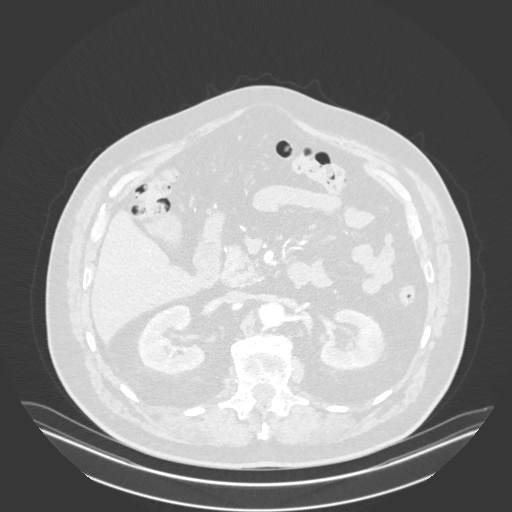
[im 22/145  lung]
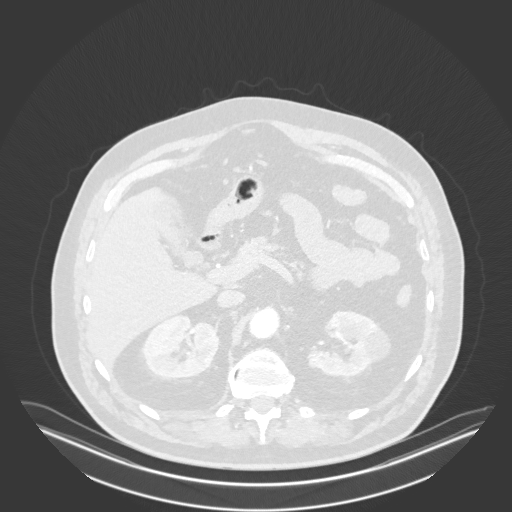
[im 33/145  lung]
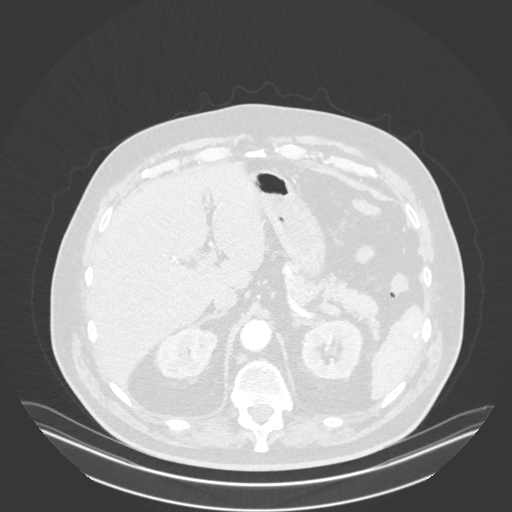
[im 43/145  lung]
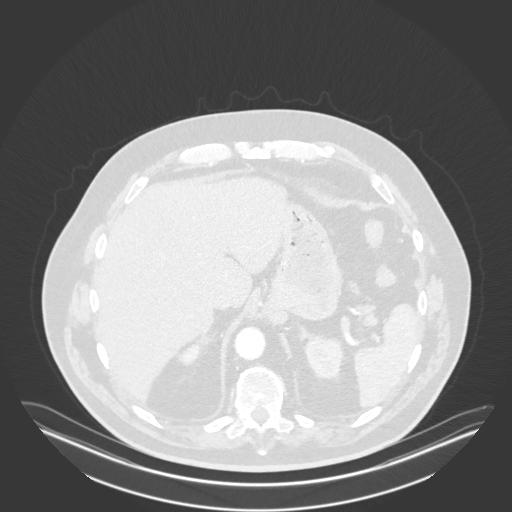
[im 54/145  mediastinal]
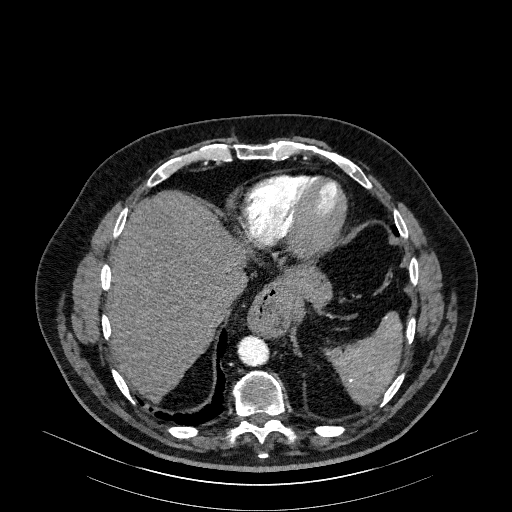
[im 54/145  lung]
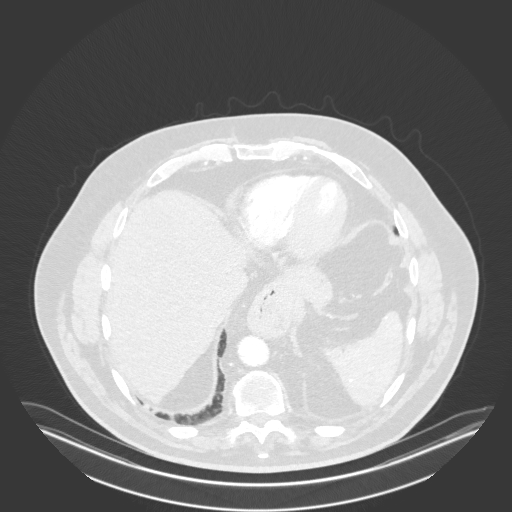
[im 65/145  lung]
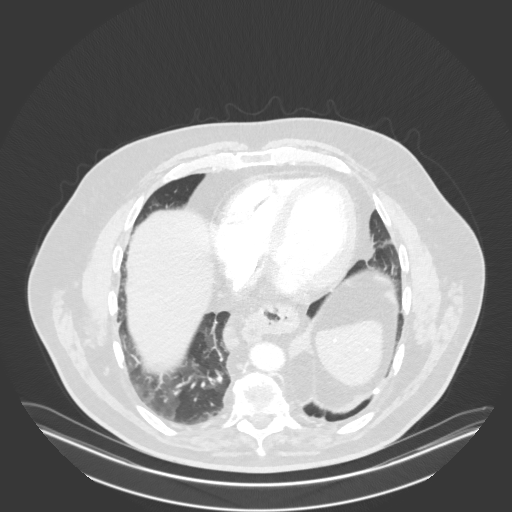
[im 81/145  lung]
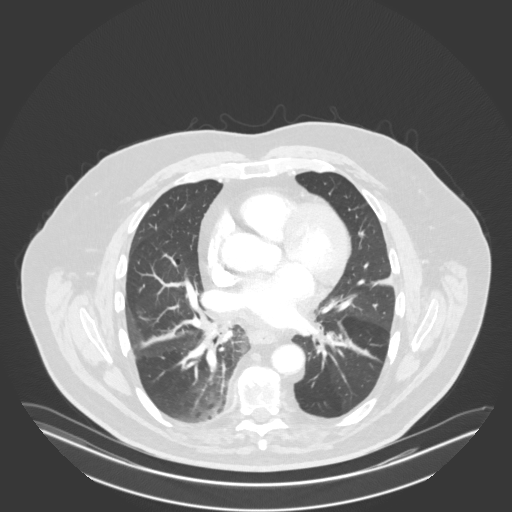
[im 91/145  lung]
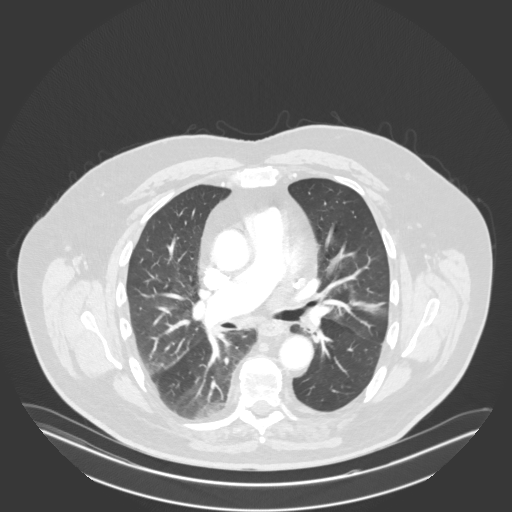
[im 102/145  mediastinal]
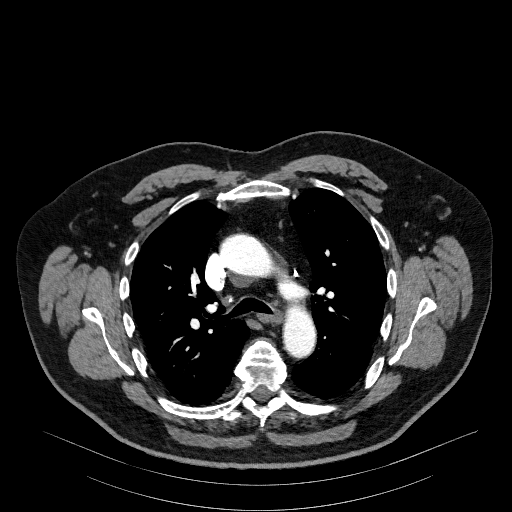
[im 102/145  lung]
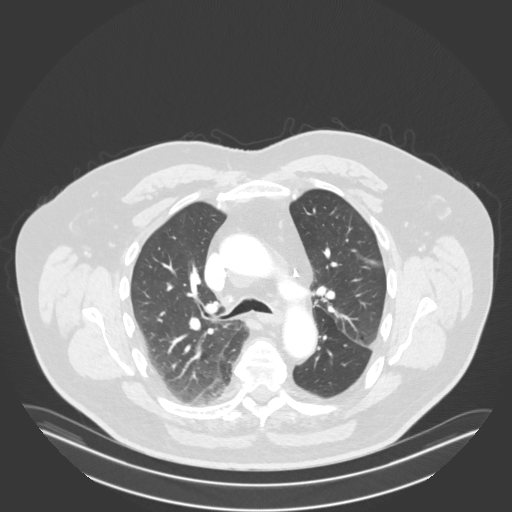
[im 113/145  lung]
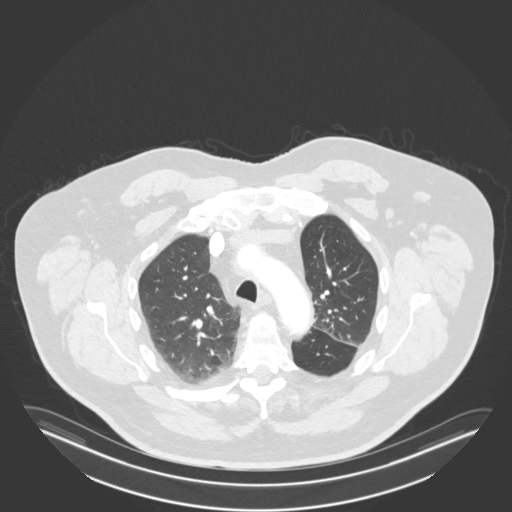
[im 123/145  lung]
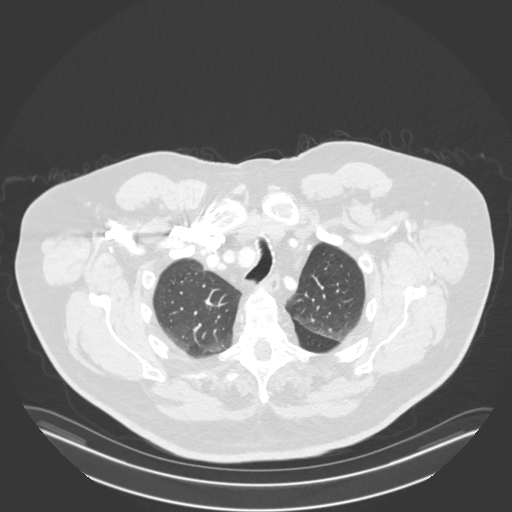
[im 134/145  lung]
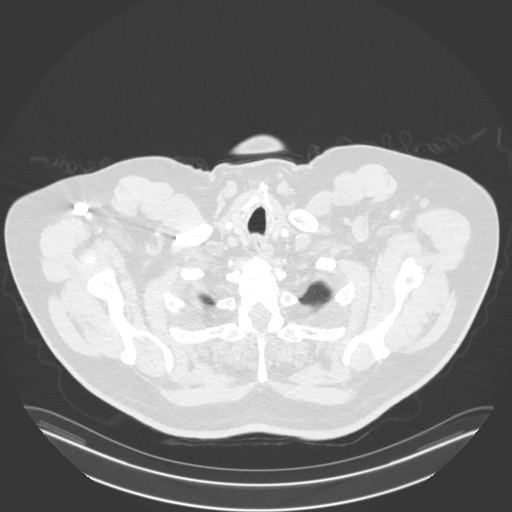

[Series 7: coronal · coronal · 0.59mm/px · 3 of 151 slices shown]
[im 31/151  lung]
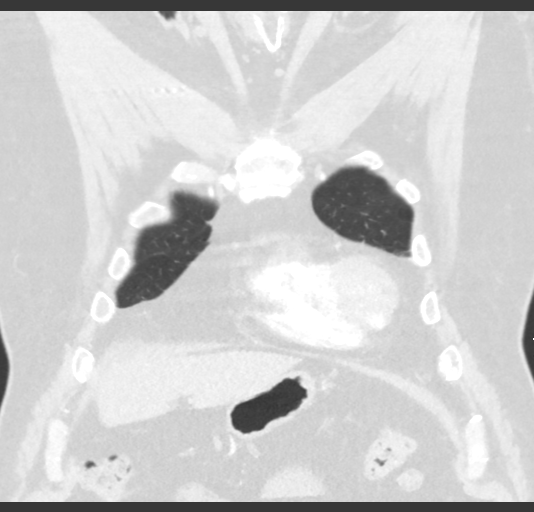
[im 61/151  lung]
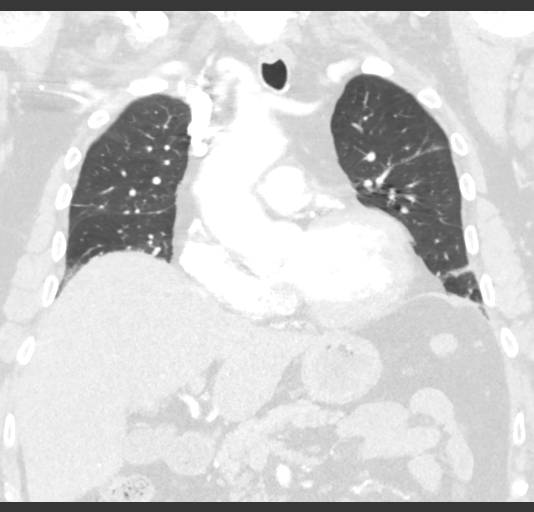
[im 91/151  lung]
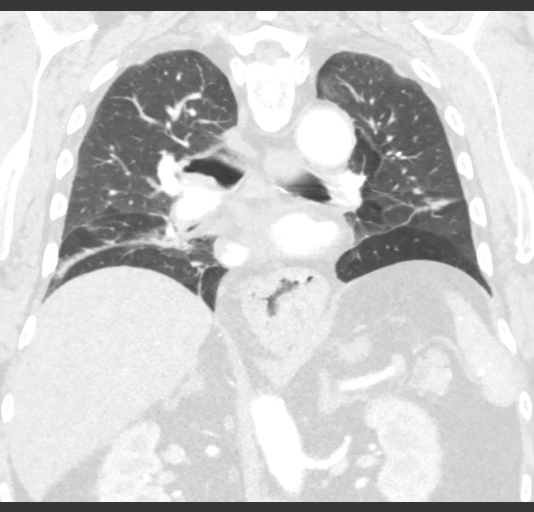

[15 of 36 positions shown; findings below may reference images not displayed]

FINDINGS: LUNGS: Stable mild bilateral peripheral dependent predominant hypoventilatory 
change. Mild bronchial wall thickening. Low lung volumes. Stable LEFT lower lobe 
platelike atelectasis/linear consolidation. No interstitial edema. 
PLEURAL SPACE: No pleural effusion. 
HEART/MEDIASTINUM: Trace pericardial effusion. Heavy calcific coronary artery 
disease. Pulmonary arteries have normal course and caliber with no central 
embolus. Calcified hilar nodes. Right main pulmonary artery measures 3.3 cm, 
slightly enlarged from previous. Main pulmonary outflow tract and LEFT pulmonary 
artery have a smaller more normal diameter. Aorta is nonaneurysmal. Left 
ventricular hypertrophy. Stable small mediastinal nodes including RIGHT 
paratracheal node measuring 7 mm short axis in prebronchial node measuring 8 mm 
short axis image 45. Small subcarinal nodes and GE junction nodes. Moderate 
hiatal hernia unchanged. 
VASCULATURE: Atherosclerotic. 
LOWER NECK: Unremarkable. 
UPPER ABDOMEN: Moderate hiatal hernia. Fatty liver. Splenic calcific 
granulomata. Left renal cortical and parapelvic cysts. Diastases of the Simou 
Binal. Otherwise unremarkable. 
OSSEOUS STRUCTURES: Degenerative spondylosis causes exaggerated thoracic 
kyphosis. No acute compression fracture. 
SOFT TISSUES: Unremarkable.
IMPRESSION: Low volume exam with hypoventilation and bronchitis. Cardiomegaly. Findings 
similar to the January 2020 exam which suggests occult small airways disease. 
No pulmonary edema/pleural effusion. No other evidence for heart failure. 
Moderate hiatal hernia. 
Fatty liver. 
No central pulmonary embolus. Exam was not performed per pulmonary 
embolus/angiographic protocol, cannot assess distally. 
Old granulomatous disease. 
Exaggerated thoracic kyphosis, unchanged. 
RADIATION DOSE REDUCTION: All CT scans are performed using radiation dose 
reduction techniques, when applicable.  Technical factors are evaluated and 
adjusted to ensure appropriate moderation of exposure.  Automated dose 
management technology is applied to adjust the radiation doses to minimize 
exposure while achieving diagnostic quality images.

## 2020-08-20 MED ORDER — APIXABAN 5 MG PO TABS
5 MG | ORAL_TABLET | Freq: Two times a day (BID) | ORAL | 0 refills | Status: AC
Start: 2020-08-20 — End: 2022-06-26

## 2021-04-28 ENCOUNTER — Encounter

## 2021-04-28 ENCOUNTER — Ambulatory Visit: Admit: 2021-04-28 | Discharge: 2021-04-28 | Payer: MEDICARE | Attending: Internal Medicine | Primary: Internal Medicine

## 2021-04-28 DIAGNOSIS — S81802A Unspecified open wound, left lower leg, initial encounter: Secondary | ICD-10-CM

## 2021-04-28 NOTE — Progress Notes (Signed)
Jeffrey [name] (DOB:  10/12/1946) is a 75 y.o. male,New patient, here for evaluation of the following chief complaint(s):  New Patient and Leg Injury (Thinks he got a spider bite about 3 months ago in [place]/Did see a Doctor down there but it is not getting any better.)         ASSESSMENT/PLAN:  1. Paroxysmal atrial fibrillation (HCC)  2. Wound of left lower extremity, initial encounter  -     [name], [name]  -     Sedimentation Rate; Future  -     C-Reactive Protein; Future  -     CBC with Auto Differential; Future  3. Primary hypertension  4. Mixed hyperlipidemia      Return in about 4 months (around 08/29/2021).     No fluctuance felt on wound and no material expressed; no concern for surrounding cellulitis; suspect wound is taking longer to heal due to underlying venous insufficiency; does have good pulses in his LEs; recommend to continue to apply bactroban, elevate legs and try to limit swelling    Subjective   SUBJECTIVE/OBJECTIVE:  HPI     Patient is a 75 y/o M with a PMHx of PAFIB on eliquis, HTN, and chronic venosus insufficiency who presents to establish care and discuss chronic left leg wound.     He spends half his time in [place] and [place]. He reports that in late Feb he developed a small wound on his left leg just above his ankle. This got worse so he went to urgent care and was prescribed doxy. This did helped but then area began to get painful, drain and larger. He went to urgent care and was diagnosed with cellulitis and again placed on antibiotics. He has been wrapping the area with a bandage and applying bactroban. He does think it is slowly getting better. Does have chronic swelling in his legs and when his legs swell more, the wound hurts more; takes Lasix and elevated his legs; denies any circulation issues, no hx diabetes, never a smoker     Hx afib and is on digoxin and Eliquis; can tell when he is in afib as his heart will flutter; denies any chest pain,  SOA; no blood in the stool or dark stools     SGHx- ablation    Social: never a smoker; occasional alcohol; denies history of drug use    Review of Systems   Constitutional: Negative.    HENT: Negative.    Respiratory: Negative.    Cardiovascular: Positive for leg swelling (chronic LE edema).   Gastrointestinal: Negative.    Genitourinary: Negative.    Skin: Positive for wound.   Neurological: Negative.    Hematological: Negative.           Objective   Physical Exam  Vitals reviewed.   Constitutional:       General: He is not in acute distress.     Appearance: Normal appearance.   HENT:      Head: Normocephalic and atraumatic.      Right Ear: Tympanic membrane, ear canal and external ear normal. There is no impacted cerumen.      Left Ear: Tympanic membrane, ear canal and external ear normal. There is no impacted cerumen.      Mouth/Throat:      Mouth: Mucous membranes are moist.      Pharynx: Oropharynx is clear.   Eyes:      General:  Right eye: No discharge.         Left eye: No discharge.   Cardiovascular:      Rate and Rhythm: Normal rate and regular rhythm.      Pulses: Normal pulses.      Heart sounds: Normal heart sounds.   Pulmonary:      Effort: Pulmonary effort is normal.      Breath sounds: Normal breath sounds.   Abdominal:      General: Bowel sounds are normal.      Palpations: Abdomen is soft.   Skin:     General: Skin is warm.      Findings: Lesion (see pic) present.   Neurological:      Mental Status: He is alert.   Psychiatric:         Mood and Affect: Mood normal.         Behavior: Behavior normal.       Media Information             Document Information    Clinical Consent:  Wound      04/28/2021 09:19   Attached To:   Office Visit on 04/28/21 with Moss Mc, MD     Source Information    Moss Mc, MD   [facility]                  An electronic signature was used to authenticate this note.    --Moss Mc, MD

## 2021-04-29 LAB — CBC WITH AUTO DIFFERENTIAL
Basophils %: 0.8 % (ref 0.0–2.0)
Basophils Absolute: 0.06 E9/L (ref 0.00–0.20)
Eosinophils %: 3.2 % (ref 0.0–6.0)
Eosinophils Absolute: 0.25 E9/L (ref 0.05–0.50)
Hematocrit: 42.2 % (ref 37.0–54.0)
Hemoglobin: 13.6 g/dL (ref 12.5–16.5)
Immature Granulocytes #: 0.03 E9/L
Immature Granulocytes %: 0.4 % (ref 0.0–5.0)
Lymphocytes %: 21.5 % (ref 20.0–42.0)
Lymphocytes Absolute: 1.67 E9/L (ref 1.50–4.00)
MCH: 29.4 pg (ref 26.0–35.0)
MCHC: 32.2 % (ref 32.0–34.5)
MCV: 91.3 fL (ref 80.0–99.9)
MPV: 10.7 fL (ref 7.0–12.0)
Monocytes %: 8.5 % (ref 2.0–12.0)
Monocytes Absolute: 0.66 E9/L (ref 0.10–0.95)
Neutrophils %: 65.6 % (ref 43.0–80.0)
Neutrophils Absolute: 5.11 E9/L (ref 1.80–7.30)
Platelets: 247 E9/L (ref 130–450)
RBC: 4.62 E12/L (ref 3.80–5.80)
RDW: 13.6 fL (ref 11.5–15.0)
WBC: 7.8 E9/L (ref 4.5–11.5)

## 2021-04-29 LAB — SEDIMENTATION RATE: Sed Rate: 11 mm/Hr (ref 0–15)

## 2021-04-29 LAB — C-REACTIVE PROTEIN: CRP: 0.3 mg/dL (ref 0.0–0.4)

## 2021-05-05 ENCOUNTER — Inpatient Hospital Stay: Admit: 2021-05-05 | Discharge: 2021-06-03 | Payer: MEDICARE | Attending: Family | Primary: Internal Medicine

## 2021-05-05 DIAGNOSIS — S81802D Unspecified open wound, left lower leg, subsequent encounter: Secondary | ICD-10-CM

## 2021-05-05 MED ORDER — LIDOCAINE HCL 4 % EX SOLN
4 % | Freq: Once | CUTANEOUS | Status: DC
Start: 2021-05-05 — End: 2021-05-06

## 2021-05-05 MED ORDER — GENTAMICIN SULFATE 0.1 % EX OINT
0.1 % | CUTANEOUS | 5 refills | Status: AC
Start: 2021-05-05 — End: 2021-05-12

## 2021-05-05 NOTE — Progress Notes (Signed)
Wound care supplies ordered for patient from [facility]

## 2021-05-05 NOTE — Discharge Instructions (Signed)
Visit Discharge/Physician Orders    Discharge condition: Stable    Assessment of pain at discharge: mild    Anesthetic used: 4%  lidocaine    Discharge to: Home    Left B[id number] automobile    Accompanied by: accompanied by self    ECF/HHA:     Dressing Orders:to wound left leg, cleanse only with normal saline solution. Begin to apply GENTAMYCIN OINTMENT, COLLAGEN DRESSING, AND COVER DRESSING (BORDERED GAUZE) ONCE DAILY.    Treatment Orders: WOUND CULTURE TAKEN TODAY IN CLINIC./     WCC followup visit ____________3 WEEKS. _________________  (Please note your next appointment above and if you are unable to keep, kindly give a 24 hour notice. Thank you.)    Physician signature:__________________________      If you experience any of the following, please call the Wound Care Center during business hours:    * Increase in Pain  * Temperature over 101  * Increase in drainage from your wound  * Drainage with a foul odor  * Bleeding  * Increase in swelling  * Need for compression bandage changes due to slippage, breakthrough drainage.    If you need medical attention outside of the business hours of the Wound Care Centers please contact your PCP or go to the nearest emergency room.

## 2021-05-05 NOTE — Progress Notes (Addendum)
[facility] Comprehensive History and Physical      CHIEF COMPLAINT:    Chief Complaint   Patient presents with   ??? Wound Check     wound left leg      The Story of the Wound    The patient is a 75 y.o. male patient of Burton Apley, DO  who presents with  a non-healing lower leg wound which is located on the leg Current symptoms include pain: mild.  Pain is rated 5/10. Pt believes may have started as a spider bite, has not healed.  Pt travels back & forth to [place].    Past Medical History:    Past Medical History:   Diagnosis Date   ??? Arrhythmia    ??? Hyperlipidemia    ??? Hypertension      Past Surgical History:    Past Surgical History:   Procedure Laterality Date   ??? APPENDECTOMY     ??? HERNIA REPAIR       Allergies:    Lidocaine-epinephrine, Cephalexin, and Sulfa antibiotics    Labs:   CBC with Differential:    Lab Results   Component Value Date    WBC 7.8 04/28/2021    RBC 4.62 04/28/2021    HGB 13.6 04/28/2021    HCT 42.2 04/28/2021    PLT 247 04/28/2021    MCV 91.3 04/28/2021    MCH 29.4 04/28/2021    MCHC 32.2 04/28/2021    RDW 13.6 04/28/2021    LYMPHOPCT 21.5 04/28/2021    MONOPCT 8.5 04/28/2021    BASOPCT 0.8 04/28/2021    MONOSABS 0.66 04/28/2021    LYMPHSABS 1.67 04/28/2021    EOSABS 0.25 04/28/2021    BASOSABS 0.06 04/28/2021     CMP:    Lab Results   Component Value Date    NA 141 09/05/2019    K 3.9 09/05/2019    K 3.5 09/04/2019    CL 101 09/05/2019    CO2 26 09/05/2019    BUN 19 09/05/2019    CREATININE 1.0 09/05/2019    GFRAA >60 09/05/2019    LABGLOM >60 09/05/2019    GLUCOSE 98 09/05/2019    CALCIUM 8.8 09/05/2019     BMP:    Lab Results   Component Value Date    NA 141 09/05/2019    K 3.9 09/05/2019    K 3.5 09/04/2019    CL 101 09/05/2019    CO2 26 09/05/2019    BUN 19 09/05/2019    CREATININE 1.0 09/05/2019    CALCIUM 8.8 09/05/2019    GFRAA >60 09/05/2019    LABGLOM >60 09/05/2019    GLUCOSE 98 09/05/2019     Hepatic Function Panel:  No results found for: ALKPHOS, ALT, AST, PROT,  BILITOT, BILIDIR, IBILI, LABALBU  Uric Acid:  No results found for: LABURIC, URICACID  PT/INR:  No results found for: PROTIME, INR  Warfarin PT/INR:  No components found for: PTPATWAR, PTINRWAR  PTT:  No results found for: APTT, PTT[APTT}  U/A:    Lab Results   Component Value Date    COLORU Straw 09/04/2019    PROTEINU Negative 09/04/2019    PHUR 6.0 09/04/2019    WBCUA 0-1 09/04/2019    RBCUA NONE 09/04/2019    BACTERIA NONE SEEN 09/04/2019    CLARITYU Clear 09/04/2019    SPECGRAV <=1.005 09/04/2019    LEUKOCYTESUR Negative 09/04/2019    UROBILINOGEN 0.2 09/04/2019    BILIRUBINUR Negative 09/04/2019  BLOODU TRACE 09/04/2019    GLUCOSEU Negative 09/04/2019     HgBA1c:    Lab Results   Component Value Date    LABA1C 5.3 09/05/2019     Microalbumen/Creatinine ratio:  No components found for: RUCREAT    Medications Prior to Admission:    Not in a hospital admission.      Social History:    reports that he has never smoked. He has never used smokeless tobacco. He reports current alcohol use of about 2.0 standard drinks of alcohol per week. He reports that he does not use drugs.    Family History:   family history includes Heart Attack in his father.    REVIEW OF SYSTEMS:  Review of Systems   Constitutional: Positive for activity change. Negative for appetite change and fever.   Respiratory: Negative for cough, chest tightness, shortness of breath and wheezing.    Cardiovascular: Negative for chest pain, palpitations and leg swelling.   Gastrointestinal: Negative for constipation, diarrhea, nausea and vomiting.   Genitourinary: Negative for difficulty urinating.   Musculoskeletal: Positive for myalgias. Negative for back pain and gait problem.   Skin: Positive for color change and wound. Negative for rash.   Neurological: Negative for dizziness, light-headedness and headaches.   Psychiatric/Behavioral: Negative for agitation. The patient is not nervous/anxious.       PHYSICAL EXAM:    Vitals:  Vitals:    05/05/21 0848    BP: (!) 142/80   Pulse: 60   Resp: 16   Temp:      General:  Awake, alert, oriented X 3.  Well developed, well nourished, 75 y.o. male.  No apparent distress.  HEENT:  Normocephalic, atraumatic.  Pupils equal, round, reactive to light.  No scleral icterus.  No conjunctival injection.  Normal lips, teeth, and gums.  No nasal discharge.  Neck:  Supple  Lungs:  CTA bilaterally, bilat symmetrical expansion, no wheeze, rales, or rhonchi  Heart:  RRR, no murmurs, gallops, rubs  Abdomen:  Bowel sounds present, soft, nontender, no masses, no organomegaly, no peritoneal signs  Extremities:  negative   Skin:  Warm and dry, satisfactory turgor   Neuro:  Cranial nerves 2-12 intact, no focal deficits.    Wound Metric Flow:   BP (!) 142/80    Pulse 60    Temp 96.1 ??F (35.6 ??C) (Temporal)    Resp 16    Ht 5\' 8"  (1.727 m)    Wt 222 lb (100.7 kg)    BMI 33.75 kg/m??   Wound serous exudate noted      Wound 05/05/21 Leg Left;Posterior new #1 left posterior leg (Active)   Wound Image   05/05/21 0859   Dressing Status New dressing applied 05/05/21 0919   Wound Cleansed Cleansed with saline 05/05/21 0919   Dressing/Treatment Collagen;Dry dressing 05/05/21 0919   Wound Length (cm) 0.9 cm 05/05/21 0859   Wound Width (cm) 0.9 cm 05/05/21 0859   Wound Depth (cm) 0.1 cm 05/05/21 0859   Wound Surface Area (cm^2) 0.81 cm^2 05/05/21 0859   Wound Volume (cm^3) 0.081 cm^3 05/05/21 0859   Post-Procedure Length (cm) 0.9 cm 05/05/21 0919   Post-Procedure Width (cm) 0.9 cm 05/05/21 0919   Post-Procedure Depth (cm) 0.2 cm 05/05/21 0919   Post-Procedure Surface Area (cm^2) 0.81 cm^2 05/05/21 0919   Post-Procedure Volume (cm^3) 0.162 cm^3 05/05/21 0919   Wound Assessment Pink/red;Fibrin 05/05/21 0859   Drainage Amount Moderate 05/05/21 0859   Drainage Description Thin;Yellow 05/05/21 0859  Odor None 05/05/21 0859   Peri-wound Assessment Fragile;Intact 05/05/21 0859   Wound Thickness Description not for Pressure Injury Full thickness 05/05/21 0859    Number of days: 0         Procedure: Excisional Debridement: Wound # 1 @ 0.81 sq cm  The patient was placed in the supine position.  Lidocaine  gauze was applied  at beginning of wound evaluation. An  Excisional Debridement was performed.  Using a curette ,  the wound was debrided sharply of all fibrotic, necrotic, and hyperkeratotic tissue, including a layer of surrounding healthy tissue to stimulate epithelization.    The wound was excised through the level of the partial thickness Wound Percentage debrided is 100%   Wound was irrigated with normal saline solution.   Bleeding was with a small amount of bleeding, and controlled with pressure .  Patient tolerated procedure well and was given proper instruction.        Active Problems:    Wound of left leg  Resolved Problems:    * No resolved hospital problems. *      Plan:  1. H&P, General medical evaluation for the purpose of Comprehensive wound management for maximum healing and minimal morbidity.           2. Excisional Debridement & wound culture  3. We had a lengthy discussion about the diagnosis and aspects  to consider.     - Pt will be returning to [facility] for 3 weeks, will schedule when he returns to [place]     - Gentamycin ordered topically     Cherene Julian, APRN - CNP       05/05/2021    10:16 AM

## 2021-05-05 NOTE — Plan of Care (Signed)
Problem: Pain  Goal: Verbalizes/displays adequate comfort level or baseline comfort level  Outcome: Progressing     Problem: Cognitive:  Goal: Knowledge of wound care  Description: Knowledge of wound care  Outcome: Progressing  Goal: Understands risk factors for wounds  Description: Understands risk factors for wounds  Outcome: Progressing

## 2021-05-07 LAB — CULTURE, ANAEROBIC

## 2021-05-07 LAB — CULTURE, WOUND

## 2021-06-02 NOTE — Discharge Instructions (Signed)
Visit Discharge/Physician Orders    Discharge condition: Stable    Assessment of pain at discharge: mild    Anesthetic used: 4%  lidocaine    Discharge to: Home    Left YIR:SWNIO[employer] automobile    Accompanied by: accompanied by self    ECF/HHA:     Dressing Orders:to wound left leg, cleanse only with normal saline solution. Begin to apply GENTAMYCIN OINTMENT, COLLAGEN DRESSING, AND COVER DRESSING (BORDERED GAUZE) ONCE DAILY.    Treatment Orders:     WCC followup visit ____________ WEEKS. _________________  (Please note your next appointment above and if you are unable to keep, kindly give a 24 hour notice. Thank you.)    Physician signature:__________________________      If you experience any of the following, please call the Wound Care Center during business hours:    * Increase in Pain  * Temperature over 101  * Increase in drainage from your wound  * Drainage with a foul odor  * Bleeding  * Increase in swelling  * Need for compression bandage changes due to slippage, breakthrough drainage.    If you need medical attention outside of the business hours of the Wound Care Centers please contact your PCP or go to the nearest emergency room.

## 2021-06-10 ENCOUNTER — Inpatient Hospital Stay: Payer: MEDICARE | Attending: Family | Primary: Internal Medicine

## 2021-06-14 ENCOUNTER — Ambulatory Visit: Admit: 2021-06-14 | Discharge: 2021-06-14 | Payer: MEDICARE | Attending: Internal Medicine | Primary: Internal Medicine

## 2021-06-14 ENCOUNTER — Encounter

## 2021-06-14 DIAGNOSIS — I10 Essential (primary) hypertension: Secondary | ICD-10-CM

## 2021-06-14 LAB — BASIC METABOLIC PANEL
Anion Gap: 16 mmol/L (ref 7–16)
BUN: 23 mg/dL (ref 6–23)
CO2: 24 mmol/L (ref 22–29)
Calcium: 8.9 mg/dL (ref 8.6–10.2)
Chloride: 104 mmol/L (ref 98–107)
Creatinine: 0.9 mg/dL (ref 0.7–1.2)
GFR African American: >60
GFR Non-African American: >60 mL/min/{1.73_m2} (ref >=60)
Glucose: 93 mg/dL (ref 74–99)
Potassium: 4.1 mmol/L (ref 3.5–5.0)
Sodium: 144 mmol/L (ref 132–146)

## 2021-06-14 LAB — BRAIN NATRIURETIC PEPTIDE: Pro-BNP: 228 pg/mL (ref 0–450)

## 2021-06-14 MED ORDER — POTASSIUM CHLORIDE CRYS ER 10 MEQ PO TBCR
10 MEQ | ORAL_TABLET | Freq: Every day | ORAL | 2 refills | Status: DC
Start: 2021-06-14 — End: 2021-07-15

## 2021-06-14 NOTE — Progress Notes (Signed)
[name] Progress Note  Dr. Rolan [name]      Referring Physician: Moss Mc, MD  CHIEF COMPLAINT:   Chief Complaint   Patient presents with   ??? Atrial Fibrillation     6 month, patient c/o SOB and b/l leg swelling        HISTORY OF PRESENT ILLNESS:   Patient is 75 years old male with history of hypertension and bradycardia. Here for follow up appointment.  Worsening shortness of breath and pedal edema, patient denies any chest pain, no lightheadedness, no dizziness, no palpitations,no PND, no orthopnea, no syncope, no presyncopal episodes.        Past Medical History:   Diagnosis Date   ??? Arrhythmia    ??? Hyperlipidemia    ??? Hypertension          Past Surgical History:   Procedure Laterality Date   ??? APPENDECTOMY     ??? HERNIA REPAIR           Current Outpatient Medications   Medication Sig Dispense Refill   ??? potassium chloride (KLOR-CON M) 10 MEQ extended release tablet Take 1 tablet by mouth daily 90 tablet 2   ??? digoxin (LANOXIN) 125 MCG tablet TAKE 1 TABLET BY MOUTH ONCE DAILY FOR 90 DAYS     ??? tadalafil (CIALIS) 10 MG tablet TAKE 1 TABLET BY MOUTH ONCE DAILY     ??? furosemide (LASIX) 40 MG tablet Take 20 mg by mouth      ??? apixaban (ELIQUIS) 5 MG TABS tablet Take 1 tablet by mouth 2 times daily 56 tablet 0   ??? losartan (COZAAR) 100 MG tablet Take 100 mg by mouth daily     ??? atorvastatin (LIPITOR) 20 MG tablet Take 1 tablet by mouth nightly 30 tablet 2   ??? omeprazole (PRILOSEC) 20 MG delayed release capsule Take 20 mg by mouth daily       No current facility-administered medications for this visit.         Allergies as of 06/14/2021 - Fully Reviewed 06/14/2021   Allergen Reaction Noted   ??? Lidocaine-epinephrine  04/28/2021   ??? Cephalexin Hives and Rash 04/28/2021   ??? Sulfa antibiotics Rash 09/04/2019       Social History     Socioeconomic History   ??? Marital status: Widowed     Spouse name: Not on file   ??? Number of children: 1   ??? Years of education: Not on file   ??? Highest education level: Not on  file   Occupational History   ??? Not on file   Tobacco Use   ??? Smoking status: Never Smoker   ??? Smokeless tobacco: Never Used   Vaping Use   ??? Vaping Use: Never used   Substance and Sexual Activity   ??? Alcohol use: Yes     Alcohol/week: 2.0 standard drinks     Types: 2 Shots of liquor per week     Comment: 2 drinks vodka a day    ??? Drug use: Never   ??? Sexual activity: Not on file   Other Topics Concern   ??? Not on file   Social History Narrative   ??? Not on file     Social Determinants of Health     Financial Resource Strain: Low Risk    ??? Difficulty of Paying Living Expenses: Not hard at all   Food Insecurity: No Food Insecurity   ??? Worried About Running Out of Food in the Last Year:  Never true   ??? Ran Out of Food in the Last Year: Never true   Transportation Needs:    ??? Lack of Transportation (Medical): Not on file   ??? Lack of Transportation (Non-Medical): Not on file   Physical Activity:    ??? Days of Exercise per Week: Not on file   ??? Minutes of Exercise per Session: Not on file   Stress:    ??? Feeling of Stress : Not on file   Social Connections:    ??? Frequency of Communication with Friends and Family: Not on file   ??? Frequency of Social Gatherings with Friends and Family: Not on file   ??? Attends Religious Services: Not on file   ??? Active Member of Clubs or Organizations: Not on file   ??? Attends Banker Meetings: Not on file   ??? Marital Status: Not on file   Intimate Partner Violence:    ??? Fear of Current or Ex-Partner: Not on file   ??? Emotionally Abused: Not on file   ??? Physically Abused: Not on file   ??? Sexually Abused: Not on file   Housing Stability:    ??? Unable to Pay for Housing in the Last Year: Not on file   ??? Number of Places Lived in the Last Year: Not on file   ??? Unstable Housing in the Last Year: Not on file       Family History   Problem Relation Age of Onset   ??? Heart Attack Father        REVIEW OF SYSTEMS:     CONSTITUTIONAL:  negative for  fevers, chills, sweats and fatigue  HEENT:   negative for  tinnitus, earaches, nasal congestion and epistaxis  RESPIRATORY:  negative for  dry cough, cough with sputum,wheezing and hemoptysis  GASTROINTESTINAL:  negative for nausea, vomiting, diarrhea, constipation, pruritus and jaundice  HEMATOLOGIC/LYMPHATIC:  negative for easy bruising, bleeding, lymphadenopathy and petechiae  ENDOCRINE:  negative for heat intolerance, cold intolerance, tremor, hair loss and diabetic symptoms including neither polyuria nor polydipsia nor blurred vision  MUSCULOSKELETAL:  negative for  myalgias, arthralgias, joint swelling, stiff joints and decreased range of motion  NEUROLOGICAL:  negative for memory problems, speech problems, visual disturbance, dysphagia, weakness and numbness      PHYSICAL EXAM:   Constitutional:  Awake, alert cooperative, no apparent distress, and appears stated age.   HEENT:  Moist and pink mucous membranes, normocephalic, without obvious abnormality, atraumatic, normal ears and nose.   NECK:  Supple, symmetrical, trachea midline, no JVD, no adenopathy, thyroid symmetric, not enlarged and no tenderness, good carotid upstroke bilaterally, no carotid bruit, skin normal.   LUNGS: No increased work of breathing, good air exchange, clear to auscultation bilaterally, no crackles or wheezing.  Cardiovascular: Normal apical impulse, regular rate and rhythm, normal S1 and S2, no S3 or S4, 2/6 systolic murmur at the apex, 2/6 systolic murmur at the left lower sternal border, no pedal edema, good carotid upstroke bilaterally, no carotid bruit, no JVD, no abdominal pulsating masses.   ABDOMEN: Obese, Soft, nontender, no hepatomegaly, no splenomegaly, bowel sound positive.   CHEST:  Expands symmetrically, nontender to palpation.  Musculoskeletal:  No clubbing or cyanosis.  No redness, warmth, or swelling of the joints.  Neurological: Alert, awake, and oriented X3.   SKIN: No bruises, no bleeding, normal skin color, texture, turgor and no redness, warmth or  swelling.      BP 138/72    Pulse 66  Resp 16    Ht 5\' 8"  (1.727 m)    Wt 233 lb (105.7 kg)    BMI 35.43 kg/m??     DATA:   I personally reviewed the visit EKG with the following interpretation: Sinus rhythm, right bundle branch block, normal axis    EKG 09/15/19  Sinus rhythm    ECHO: 09/05/19 Summary   Technically difficult examination. Enhancing contrast was used to define   endocardial borders.   Normal left ventricular chamber size.   Normal left ventricular systolic function.   Visually estimated LVEF is 60-65 %.   No wall motion abnormalities.   Normal right ventricle structure and function.   No significant valvular abnormalities.   No comparison study available.    Stress Test: Cardiac Testing:  EKG - (06/22/2017) Sinus bradycardia.  Normal ECG.  Echocardiogram - (06/26/2017) Normal left ventricular systolic function with stage I diastolic dysfunction.  Trace mitral valve regurgitation.  Trace tricuspid valve regurgitation with RVSP of 30 mmHg.  Stress Test - (07/05/2017) Negative Lexiscan stress test for ischemic symptoms or  ischemic EKG changes.  Normal Cardiolite perfusion scan without evidence of fixed or reversible defect.  Normal left ventricular systolic function with normal wall motion.  There is no old comparison study.  The results of the study predict low probability for significant coronary artery disease or future cardiac events.    Angiography:    [name] Labs: BMP:    Lab Results   Component Value Date    NA 144 06/14/2021    K 4.1 06/14/2021    K 3.5 09/04/2019    CL 104 06/14/2021    CO2 24 06/14/2021    BUN 23 06/14/2021    CREATININE 0.9 06/14/2021     CMP:    Lab Results   Component Value Date    NA 144 06/14/2021    K 4.1 06/14/2021    K 3.5 09/04/2019    CL 104 06/14/2021    CO2 24 06/14/2021    BUN 23 06/14/2021    CREATININE 0.9 06/14/2021     CBC:    Lab Results   Component Value Date    WBC 7.8 04/28/2021    RBC 4.62 04/28/2021    HGB 13.6 04/28/2021    HCT 42.2 04/28/2021    MCV  91.3 04/28/2021    RDW 13.6 04/28/2021    PLT 247 04/28/2021     PT/INR:  No results found for: PTINR  PT/INR Warfarin:  No components found for: PTPATWAR, PTINRWAR  PTT:  No results found for: APTT  PTT Heparin:  No components found for: APTTHEP  Magnesium:    Lab Results   Component Value Date    MG 2.1 09/04/2019     TSH:    Lab Results   Component Value Date    TSH 1.670 09/04/2019     TROPONIN:  No components found for: TROP  BNP:  No results found for: BNP  FASTING LIPID PANEL:    Lab Results   Component Value Date    CHOL 177 09/05/2019    HDL 34 09/05/2019    TRIG 11/05/2019 09/05/2019     No orders to display     I have personally reviewed the laboratory, cardiac diagnostic and radiographic testing as outlined above:      IMPRESSION:  1: Acute CHF: Suspect diastolic, decompensated, will adjust Lasix dose         2: Paroxysmal atrial fibrillation: Now in sinus rhythm, continue  current treatment, continue chronic anticoagulation with Eliquis  3: Hypertension: Controlled, continue current treatment.            4: Family history of ischemic heart disease and other diseases of the circulatory system :   Father died of massive heart attack at age 76        5: Chronic anticoagulation         RECOMMENDATIONS:   1.  Increase Lasix from continue current treatment  2.  Basic metabolic panel and BNP  3.  Echocardiogram to evaluate ejection fraction  4.  Increase risk of bleeding due to being on anti-coagulation, symptoms and signs of bleeding discussed with patient, patient was advised to seek medical attention at the earliest symptoms or signs of bleeding.  5.  Follow-up with Jeffrey [name] as scheduled  6.  Follow-up with Jeffrey [name] after his tests    I have reviewed my findings and recommendations with patient    Electronically signed by Jeffrey BuccoUrwa Darothy Courtright, MD on 06/14/2021 at 8:36 PM    NOTE: This report was transcribed using voice recognition software. Every effort was made to ensure accuracy; however, inadvertent computerized  transcription errors may be present  Please see scanned note in epic

## 2021-06-17 MED ORDER — FUROSEMIDE 40 MG PO TABS
40 MG | ORAL_TABLET | Freq: Every day | ORAL | 2 refills | Status: AC
Start: 2021-06-17 — End: 2022-09-04

## 2021-06-17 NOTE — Telephone Encounter (Signed)
Information left on voicemail

## 2021-06-17 NOTE — Telephone Encounter (Signed)
Patient was seen in the office on Tuesday and his lasix was increased to 40mg  bid    He has lost 6-7 pounds - the swelling has gone down, but he sill has some.  If continuing the current dose, he will need a new Rx sent to [facility]

## 2021-06-17 NOTE — Telephone Encounter (Signed)
Should continue 40 mg twice daily until the swelling is gone, then go back to 40 mg daily, should call us next week with a progress

## 2021-07-06 NOTE — Telephone Encounter (Signed)
Spoke to patient - his legs are better - he has still taken the lasix 40mg  bid but is going to drop down to the once daily starting today.  He was advised to call if he develops any problems

## 2021-07-15 ENCOUNTER — Ambulatory Visit: Admit: 2021-07-15 | Discharge: 2021-07-15 | Payer: MEDICARE | Attending: Internal Medicine | Primary: Internal Medicine

## 2021-07-15 DIAGNOSIS — M5431 Sciatica, right side: Secondary | ICD-10-CM

## 2021-07-15 MED ORDER — TIZANIDINE HCL 2 MG PO TABS
2 MG | ORAL_TABLET | Freq: Every evening | ORAL | 0 refills | Status: DC | PRN
Start: 2021-07-15 — End: 2021-08-02

## 2021-07-15 MED ORDER — HYDROXYZINE HCL 25 MG PO TABS
25 MG | ORAL_TABLET | Freq: Three times a day (TID) | ORAL | 0 refills | Status: AC | PRN
Start: 2021-07-15 — End: 2021-07-25

## 2021-07-15 NOTE — Progress Notes (Signed)
Jeffrey [name] (DOB:  [birth year]) is a 75 y.o. male,Established patient, here for evaluation of the following chief complaint(s):  Hip Pain (Right hip pain off and on for about 3 months. It is a sharp pain.) and Urticaria (Hives that come and go with itch. His fingers also go numb. This happens mostly at night. He stop his potassium because he thinks that is the cause.)         ASSESSMENT/PLAN:  1. Sciatic nerve pain, right  -     tiZANidine (ZANAFLEX) 2 MG tablet; Take 1 tablet by mouth nightly as needed (muscle spasms), Disp-30 tablet, R-0Normal  2. Generalized pruritus  -     hydrOXYzine HCl (ATARAX) 25 MG tablet; Take 1 tablet by mouth every 8 hours as needed for Itching, Disp-30 tablet, R-0Normal    No follow-ups on file.     Discussed using heat 2x a day and provided patient with stretches; discussed trying to decrease NSAIDs given Eliquis use; if no improvement, to call and will refer to PT    Subjective   SUBJECTIVE/OBJECTIVE:  HPI    Patient is a 75 y/o M with a PMHx of afib and HTN who presents for acute visit for right hip pain.     Pain has been on and off for the past 3-4 months but has recently progressed and is more constant; located in his right buttock; does not radiate anywhere; no inciting injury, no numbness and tingling of the leg; worse when going up and down the stairs; has been taking 4 ibuprofen a day with some relief    Also reports he broke out in hives and has been experiencing generalized itching; his cardiologist increased his lasix temporarily and so placed him on a potassium pill and he thought this caused it but he has been off the potassium pill for a month and has still been itching; no changes to foods, soaps, or detergents     Review of Systems   Constitutional: Negative.    HENT: Negative.     Respiratory: Negative.     Cardiovascular: Negative.    Gastrointestinal: Negative.    Musculoskeletal:  Positive for back pain.   Skin:  Positive for rash.   Psychiatric/Behavioral:  Negative.          Objective   Physical Exam  Vitals reviewed.   Constitutional:       General: He is not in acute distress.     Appearance: Normal appearance.   HENT:      Head: Normocephalic and atraumatic.   Eyes:      General:         Right eye: No discharge.         Left eye: No discharge.   Cardiovascular:      Rate and Rhythm: Normal rate and regular rhythm.      Pulses: Normal pulses.      Heart sounds: Normal heart sounds.   Pulmonary:      Effort: Pulmonary effort is normal.      Breath sounds: Normal breath sounds.   Musculoskeletal:         General: Tenderness (R buttock area) present. No swelling or deformity.      Right lower leg: Edema present.      Left lower leg: Edema present.      Comments: Some discomfort with R straight leg raise  No pain with R hip abduction or adduction   Skin:     General: Skin is  warm and dry.   Neurological:      General: No focal deficit present.      Mental Status: He is alert and oriented to person, place, and time.   Psychiatric:         Mood and Affect: Mood normal.         Behavior: Behavior normal.                An electronic signature was used to authenticate this note.    --Moss Mc, MD

## 2021-07-20 NOTE — Telephone Encounter (Signed)
Left message with pt regarding his echo appt.

## 2021-07-21 ENCOUNTER — Inpatient Hospital Stay: Admit: 2021-07-21 | Payer: MEDICARE | Primary: Internal Medicine

## 2021-07-21 DIAGNOSIS — I509 Heart failure, unspecified: Secondary | ICD-10-CM

## 2021-07-21 LAB — ECHOCARDIOGRAM COMPLETE 2D W DOPPLER W COLOR: Left Ventricular Ejection Fraction: 55

## 2021-07-21 NOTE — Other (Signed)
Would you please call the patient let him know that his echocardiogram is unremarkable, I will see in 6 months unless symptomatic  Thanks

## 2021-07-26 NOTE — Telephone Encounter (Signed)
Called patient with results...  He wants to know if he should continue taking 40mg  of lasix daily or should he cut it in half?

## 2021-07-26 NOTE — Telephone Encounter (Signed)
-----   Message from [name], MD sent at 07/26/2021  9:17 AM EDT -----  Would you please call the patient let him know that his echocardiogram is unremarkable, I will see in 6 months unless symptomatic  Thanks

## 2021-07-26 NOTE — Telephone Encounter (Signed)
He can change it to Monday Wednesday Friday and as needed for pedal edema

## 2021-07-26 NOTE — Telephone Encounter (Signed)
Called patient and let him know he could change lasix to Monday Wednesday Friday.

## 2021-08-02 ENCOUNTER — Ambulatory Visit: Admit: 2021-08-02 | Discharge: 2021-08-02 | Payer: MEDICARE | Attending: Internal Medicine | Primary: Internal Medicine

## 2021-08-02 DIAGNOSIS — M5431 Sciatica, right side: Secondary | ICD-10-CM

## 2021-08-02 MED ORDER — TIZANIDINE HCL 2 MG PO TABS
2 MG | ORAL_TABLET | Freq: Two times a day (BID) | ORAL | 1 refills | Status: AC
Start: 2021-08-02 — End: 2021-09-01

## 2021-08-02 NOTE — Progress Notes (Signed)
Jeffrey [name] (DOB:  November 07, 1946) is a 75 y.o. male,Established patient, here for evaluation of the following chief complaint(s):  Hip Pain (Both hips are still hurt. It has not gotten any better. He has been taking two muscle relaxer though the scripts said one.)         ASSESSMENT/PLAN:  1. Sciatic nerve pain, right  -     tiZANidine (ZANAFLEX) 2 MG tablet; Take 1 tablet by mouth in the morning and at bedtime, Disp-60 tablet, R-1Normal    Return in about 2 months (around 10/02/2021).     Exam not consistent with Hip OA so given this and improvement in symptoms, will hold on imaging; provided patient with stretches and exercises to perform and advised using heat    Subjective   SUBJECTIVE/OBJECTIVE:  HPI    Patient is a 75 y/o M who presents for follow up of hip pain.    He is now getting intermittent pain in both L and R hip-posterior in his buttock; pain does not radiate down his leg, no numbness or tingling and no weakness; tizanidine significantly improves his pain and he has been taking this twice a day; when he takes this, he has full ROM and no issues; he has also been able to decrease his NSAID use    Review of Systems   Constitutional: Negative.    HENT: Negative.     Respiratory: Negative.     Cardiovascular: Negative.    Gastrointestinal: Negative.    Musculoskeletal:  Positive for myalgias.   Neurological: Negative.    Psychiatric/Behavioral: Negative.          Objective   Physical Exam  Vitals reviewed.   Constitutional:       General: He is not in acute distress.     Appearance: Normal appearance.   HENT:      Head: Normocephalic and atraumatic.   Eyes:      General:         Right eye: No discharge.         Left eye: No discharge.   Cardiovascular:      Rate and Rhythm: Normal rate and regular rhythm.      Pulses: Normal pulses.      Heart sounds: Normal heart sounds.   Pulmonary:      Effort: Pulmonary effort is normal.      Breath sounds: Normal breath sounds.   Musculoskeletal:         General: No  swelling or tenderness.      Comments: BL hip abbduction and adduction normal  Straight leg test normal bilaterally    Neurological:      General: No focal deficit present.      Mental Status: He is alert and oriented to person, place, and time.      Motor: No weakness.   Psychiatric:         Mood and Affect: Mood normal.         Behavior: Behavior normal.              An electronic signature was used to authenticate this note.    --Moss Mc, MD

## 2021-09-05 ENCOUNTER — Encounter: Attending: Internal Medicine | Primary: Internal Medicine

## 2021-09-07 ENCOUNTER — Encounter: Payer: BLUE CROSS/BLUE SHIELD | Attending: Internal Medicine | Primary: Internal Medicine

## 2021-09-08 ENCOUNTER — Encounter: Payer: MEDICARE | Attending: Internal Medicine | Primary: Internal Medicine

## 2021-09-22 NOTE — Telephone Encounter (Signed)
Pt has eye infection received drops from eye doctor     Doctor recommended patient call to get oral antibiotic for pain and infection    [facility]  [place]

## 2021-09-23 ENCOUNTER — Encounter

## 2021-09-23 MED ORDER — DOXYCYCLINE HYCLATE 100 MG PO TABS
100 MG | ORAL_TABLET | Freq: Two times a day (BID) | ORAL | 0 refills | Status: AC
Start: 2021-09-23 — End: 2021-09-30

## 2021-10-03 ENCOUNTER — Ambulatory Visit: Admit: 2021-10-03 | Discharge: 2021-10-03 | Payer: MEDICARE | Attending: Internal Medicine | Primary: Internal Medicine

## 2021-10-03 DIAGNOSIS — H43392 Other vitreous opacities, left eye: Secondary | ICD-10-CM

## 2021-10-03 NOTE — Progress Notes (Signed)
Demarco A Veney (DOB:  09-08-1946) is a 75 y.o. male,Established patient, here for evaluation of the following chief complaint(s):  Back Pain (Patient states pain is better. No new complaints. )         ASSESSMENT/PLAN:  1. Vitreous floaters of left eye  -     External Referral To Ophthalmology  2. Encounter for vaccination  -     Influenza, FLUAD, (age 61 y+), IM, PF, 0.5 mL      Return in about 8 months (around 06/03/2022).     Will refer patient to opthal for further eval of floaters in eye given recent infection    Patient will be leaving for [place] in 2 weeks and will not return until next spring     Subjective   SUBJECTIVE/OBJECTIVE:  HPI    Patient is a 75 y/o M with a PMHx of afib and HTN who presents for follow up.    Reports his hip and back and hip pain have resolved.    He was diagnosed with L eye conjunctivitis and was given PO antibiotics and antibiotic eye drops. Reports that pain and redness have resolved but he continues to have floaters in the left eye constantly and sometimes make his vision blurry; denies any loss of vision, curtain coming down over this eye, blurry vision    Denies any chest pain, SOA, LE edema    Review of Systems   Constitutional: Negative.    HENT: Negative.     Eyes:  Positive for visual disturbance.   Respiratory: Negative.     Cardiovascular: Negative.    Gastrointestinal: Negative.    Musculoskeletal: Negative.    Neurological: Negative.    Psychiatric/Behavioral: Negative.          Objective   Physical Exam  Vitals reviewed.   Constitutional:       General: He is not in acute distress.     Appearance: Normal appearance.   HENT:      Head: Normocephalic and atraumatic.      Mouth/Throat:      Mouth: Mucous membranes are moist.      Pharynx: Oropharynx is clear.   Eyes:      Extraocular Movements: Extraocular movements intact.      Conjunctiva/sclera: Conjunctivae normal.      Pupils: Pupils are equal, round, and reactive to light.   Cardiovascular:      Rate and Rhythm:  Normal rate and regular rhythm.      Pulses: Normal pulses.      Heart sounds: Normal heart sounds.   Pulmonary:      Effort: Pulmonary effort is normal.      Breath sounds: Normal breath sounds.   Neurological:      General: No focal deficit present.      Mental Status: He is alert and oriented to person, place, and time.   Psychiatric:         Mood and Affect: Mood normal.         Behavior: Behavior normal.                An electronic signature was used to authenticate this note.    --Moss Mc, MD

## 2021-10-04 ENCOUNTER — Emergency Department: Admit: 2021-10-05 | Payer: MEDICARE | Primary: Internal Medicine

## 2021-10-04 DIAGNOSIS — R002 Palpitations: Secondary | ICD-10-CM

## 2021-10-04 NOTE — ED Notes (Signed)
Pt states tachycardia at home today approx 200 bpm.       Eugene Garnet, RN  10/04/21 2223

## 2021-10-04 NOTE — ED Provider Notes (Signed)
[facility]     [facility]  Department of Emergency Medicine   ED  Encounter Note  Admit Date/RoomTime: 10/04/2021  8:49 PM  ED Room: 16/16  NAME: Jeffrey [name]  DOB: 06-06-1946  MRN: [id number]     Chief Complaint:  Tachycardia (X 2 DAYS/ SOB/ HX A-FIB LAST EPISODE OVER YEAR/ PT HX ABLATION )    HISTORY OF PRESENT ILLNESS        Jeffrey [name] is a 75 y.o. male who presents to the ED with a complaint of racing heart.  Patient does admit to a history of atrial fibrillation.  Is on digoxin and Eliquis.  Patient states earlier today he felt like his heart was pounding out of his chest.  Numbness and tingling down both of his arms.  Felt short of breath.  Patient states he checked his blood pressure and heart rate and states his heart rate was above 150.  It lasted for couple minutes and then started to come down rather quickly.  Patient does admit that upon arrival to the ER he is feeling much better.  Does not feel like his heart is racing anymore.  Denies feeling lightheaded.  Did not pass out.  Currently denies any chest pain or shortness of breath.  Patient denies any recent illness.  Denies any cough or chest congestion.  Has not taken any over-the-counter cough or cold medicines recently.  Denies any change in his medications.  Also denies missing any of his medications.  Currently symptoms are mild in severity.      ROS   Pertinent positives and negatives are stated within HPI, all other systems reviewed and are negative.    Past Medical History:  has a past medical history of Arrhythmia, Hyperlipidemia, and Hypertension.    Surgical History:  has a past surgical history that includes Appendectomy and hernia repair.    Social History:  reports that he has never smoked. He has never used smokeless tobacco. He reports current alcohol use of about 2.0 standard drinks per week. He reports that he does not use drugs.    Family History: family history includes Heart Attack in his father.      Allergies: Lidocaine-epinephrine, Cephalexin, and Sulfa antibiotics    PHYSICAL EXAM   Oxygen Saturation Interpretation: Normal on room air analysis.        ED Triage Vitals   BP Temp Temp src Heart Rate Resp SpO2 Height Weight   -- 10/04/21 2037 -- 10/04/21 2037 10/04/21 2037 10/04/21 2037 -- 10/04/21 2044    97.1 ??F (36.2 ??C)  75 19 98 %  225 lb (102.1 kg)         General:  NAD.  Alert and Oriented.  Well-appearing.  Skin:  Warm, dry.  No rashes.  Head:  Normocephalic.  Atraumatic.  Eyes:  EOMI.  Conjunctiva normal.  ENT:  Oral mucosa moist.  Airway patent.  Neck:  Supple.  Normal ROM.    Respiratory:  No respiratory distress.  No labored breathing.  Lungs clear without rales, rhonchi or wheezing.  Cardiovascular:  Regular rate.  No Murmur.  No peripheral edema.  Extremities warm and good color.  Chest:  Abdomen:  Soft, nondistended.  Normal bowel sounds.  Nontender to palpation all 4 quadrants.  Negative rebound, negative guarding.  Rectal:  Gu:  Bladder nontender and non distended.  No CVA tenderness.  Pelvic:  Extremities:  Normal ROM.  Nontender to palpation.  Atraumatic.  Back:  Normal ROM.  Nontender to palpation.  Neuro:  Alert and Oriented to person, place, time and situation.  Normal LOC.  Moves all extremities.  Speech fluent.  Psych:  Calm and Cooperative.  Normal thought process.  Normal judgement.    Lab / Imaging Results   (All laboratory and radiology results have been personally reviewed by myself)  Labs:  Results for orders placed or performed during the hospital encounter of 10/04/21   CBC with Auto Differential   Result Value Ref Range    WBC 10.0 4.5 - 11.5 E9/L    RBC 4.63 3.80 - 5.80 E12/L    Hemoglobin 13.5 12.5 - 16.5 g/dL    Hematocrit 48.5 46.2 - 54.0 %    MCV 87.9 80.0 - 99.9 fL    MCH 29.2 26.0 - 35.0 pg    MCHC 33.2 32.0 - 34.5 %    RDW 13.2 11.5 - 15.0 fL    Platelets 263 130 - 450 E9/L    MPV 9.7 7.0 - 12.0 fL    Neutrophils % 63.3 43.0 - 80.0 %    Immature Granulocytes % 0.3  0.0 - 5.0 %    Lymphocytes % 24.7 20.0 - 42.0 %    Monocytes % 7.9 2.0 - 12.0 %    Eosinophils % 3.3 0.0 - 6.0 %    Basophils % 0.5 0.0 - 2.0 %    Neutrophils Absolute 6.35 1.80 - 7.30 E9/L    Immature Granulocytes # 0.03 E9/L    Lymphocytes Absolute 2.47 1.50 - 4.00 E9/L    Monocytes Absolute 0.79 0.10 - 0.95 E9/L    Eosinophils Absolute 0.33 0.05 - 0.50 E9/L    Basophils Absolute 0.05 0.00 - 0.20 E9/L   Comprehensive Metabolic Panel w/ Reflex to MG   Result Value Ref Range    Sodium 138 132 - 146 mmol/L    Potassium reflex Magnesium 3.6 3.5 - 5.0 mmol/L    Chloride 97 (L) 98 - 107 mmol/L    CO2 30 (H) 22 - 29 mmol/L    Anion Gap 11 7 - 16 mmol/L    Glucose 97 74 - 99 mg/dL    BUN 15 6 - 23 mg/dL    Creatinine 0.9 0.7 - 1.2 mg/dL    GFR Non-African American >60 >=60 mL/min/1.73    GFR African American >60     Calcium 8.7 8.6 - 10.2 mg/dL    Total Protein 7.4 6.4 - 8.3 g/dL    Albumin 4.0 3.5 - 5.2 g/dL    Total Bilirubin 0.2 0.0 - 1.2 mg/dL    Alkaline Phosphatase 81 40 - 129 U/L    ALT 23 0 - 40 U/L    AST 17 0 - 39 U/L   Troponin   Result Value Ref Range    Troponin, High Sensitivity 17 (H) 0 - 11 ng/L   Urinalysis with Microscopic   Result Value Ref Range    Color, UA Straw Straw/Yellow    Clarity, UA Clear Clear    Glucose, Ur Negative Negative mg/dL    Bilirubin Urine Negative Negative    Ketones, Urine Negative Negative mg/dL    Specific Gravity, UA <=1.005 1.005 - 1.030    Blood, Urine TRACE (A) Negative    pH, UA 6.5 5.0 - 9.0    Protein, UA Negative Negative mg/dL    Urobilinogen, Urine 0.2 <2.0 E.U./dL    Nitrite, Urine Negative Negative    Leukocyte Esterase, Urine  Negative Negative    WBC, UA NONE 0 - 5 /HPF    RBC, UA 0-1 0 - 2 /HPF    Epithelial Cells, UA NONE SEEN /HPF    Bacteria, UA NONE SEEN None Seen /HPF   Digoxin Level   Result Value Ref Range    Digoxin Lvl 0.7 (L) 0.8 - 2.0 ng/mL   Troponin   Result Value Ref Range    Troponin, High Sensitivity 17 (H) 0 - 11 ng/L   EKG 12 Lead   Result Value  Ref Range    Ventricular Rate 81 BPM    Atrial Rate 81 BPM    P-R Interval 146 ms    QRS Duration 130 ms    Q-T Interval 398 ms    QTc Calculation (Bazett) 462 ms    P Axis 65 degrees    R Axis -40 degrees    T Axis -19 degrees     Imaging:  All Radiology results interpreted by Radiologist unless otherwise noted.  XR CHEST (2 VW)   Final Result   No acute cardiopulmonary process.           EKG done at 2043.  Sinus arrhythmia with occasional PAC.  Patient does have P waves with normal PR interval.  No ST elevation or depression.  QTc 462.  Negative STEMI, reviewed by Dr. Vassie Loll  ED Course / Medical Decision Making   Medications - No data to display     Re-examination:  10/04/21       Time: 2300  Patient???s condition has improved and is currently asymptomatic.    Consult(s):   None    Procedure(s):   None    MDM:   Heart rate has remained in the 60s to 70s.  Patient states he has not felt any rapid heart rate again.  Feels fine and is ready to go home.  I did advise him to call his cardiologist tomorrow and let them know what had happened and that he was here.    Plan of Care/Counseling:  Soyla Murphy, [place] reviewed today's visit with the patient in addition to providing specific details for the plan of care and counseling regarding the diagnosis and prognosis.  Questions are answered at this time and are agreeable with the plan.    ASSESSMENT     1. Palpitations New Problem   2. Serum digoxin level below therapeutic range New Problem     PLAN   Discharged home.  Patient condition is good    New Medications     New Prescriptions    No medications on file     Electronically signed by Soyla Murphy, PA   DD: 10/04/21  **This report was transcribed using voice recognition software. Every effort was made to ensure accuracy; however, inadvertent computerized transcription errors may be present.  END OF ED PROVIDER NOTE       Soyla Murphy, [place]  10/04/21 2320      ATTENDING PROVIDER ATTESTATION:     Supervising Physician,  on-site, available for consultation, non-participatory in the evaluation or care of this patient.         Chester Sibert Vassie Loll, DO  11/02/21 [phone]

## 2021-10-04 NOTE — ED Notes (Signed)
ekg done     Uvaldo Rising, RN  10/04/21 2053

## 2021-10-05 ENCOUNTER — Inpatient Hospital Stay: Admit: 2021-10-05 | Discharge: 2021-10-05 | Disposition: A | Discharge status: Home / Self Care | Payer: MEDICARE

## 2021-10-05 LAB — DIGOXIN LEVEL: Digoxin Lvl: 0.7 ng/mL — ABNORMAL LOW (ref 0.8–2.0)

## 2021-10-05 LAB — URINALYSIS WITH MICROSCOPIC
Bacteria, UA: NONE SEEN /HPF
Bilirubin Urine: NEGATIVE
Epithelial Cells, UA: NONE SEEN /HPF
Glucose, Ur: NEGATIVE mg/dL
Ketones, Urine: NEGATIVE mg/dL
Leukocyte Esterase, Urine: NEGATIVE
Nitrite, Urine: NEGATIVE
Protein, UA: NEGATIVE mg/dL
Specific Gravity, UA: <=1.005 (ref 1.005–1.030)
Urobilinogen, Urine: 0.2 E.U./dL (ref <2.0)
pH, UA: 6.5 (ref 5.0–9.0)

## 2021-10-05 LAB — EKG 12-LEAD
Atrial Rate: 81 {beats}/min
P Axis: 65 degrees
P-R Interval: 146 ms
Q-T Interval: 398 ms
QRS Duration: 130 ms
QTc Calculation (Bazett): 462 ms
R Axis: -40 degrees
T Axis: -19 degrees
Ventricular Rate: 81 {beats}/min

## 2021-10-05 LAB — COMPREHENSIVE METABOLIC PANEL W/ REFLEX TO MG FOR LOW K
ALT: 23 U/L (ref 0–40)
AST: 17 U/L (ref 0–39)
Albumin: 4 g/dL (ref 3.5–5.2)
Alkaline Phosphatase: 81 U/L (ref 40–129)
Anion Gap: 11 mmol/L (ref 7–16)
BUN: 15 mg/dL (ref 6–23)
CO2: 30 mmol/L — ABNORMAL HIGH (ref 22–29)
Calcium: 8.7 mg/dL (ref 8.6–10.2)
Chloride: 97 mmol/L — ABNORMAL LOW (ref 98–107)
Creatinine: 0.9 mg/dL (ref 0.7–1.2)
GFR African American: >60
GFR Non-African American: >60 mL/min/{1.73_m2} (ref >=60)
Glucose: 97 mg/dL (ref 74–99)
Potassium reflex Magnesium: 3.6 mmol/L (ref 3.5–5.0)
Sodium: 138 mmol/L (ref 132–146)
Total Bilirubin: 0.2 mg/dL (ref 0.0–1.2)
Total Protein: 7.4 g/dL (ref 6.4–8.3)

## 2021-10-05 LAB — CBC WITH AUTO DIFFERENTIAL
Basophils %: 0.5 % (ref 0.0–2.0)
Basophils Absolute: 0.05 E9/L (ref 0.00–0.20)
Eosinophils %: 3.3 % (ref 0.0–6.0)
Eosinophils Absolute: 0.33 E9/L (ref 0.05–0.50)
Hematocrit: 40.7 % (ref 37.0–54.0)
Hemoglobin: 13.5 g/dL (ref 12.5–16.5)
Immature Granulocytes #: 0.03 E9/L
Immature Granulocytes %: 0.3 % (ref 0.0–5.0)
Lymphocytes %: 24.7 % (ref 20.0–42.0)
Lymphocytes Absolute: 2.47 E9/L (ref 1.50–4.00)
MCH: 29.2 pg (ref 26.0–35.0)
MCHC: 33.2 % (ref 32.0–34.5)
MCV: 87.9 fL (ref 80.0–99.9)
MPV: 9.7 fL (ref 7.0–12.0)
Monocytes %: 7.9 % (ref 2.0–12.0)
Monocytes Absolute: 0.79 E9/L (ref 0.10–0.95)
Neutrophils %: 63.3 % (ref 43.0–80.0)
Neutrophils Absolute: 6.35 E9/L (ref 1.80–7.30)
Platelets: 263 E9/L (ref 130–450)
RBC: 4.63 E12/L (ref 3.80–5.80)
RDW: 13.2 fL (ref 11.5–15.0)
WBC: 10 E9/L (ref 4.5–11.5)

## 2021-10-05 LAB — TROPONIN
Troponin, High Sensitivity: 17 ng/L — ABNORMAL HIGH (ref 0–11)
Troponin, High Sensitivity: 17 ng/L — ABNORMAL HIGH (ref 0–11)

## 2022-05-09 DIAGNOSIS — I509 Heart failure, unspecified: Secondary | ICD-10-CM

## 2022-05-11 ENCOUNTER — Encounter

## 2022-05-11 ENCOUNTER — Ambulatory Visit: Admit: 2022-05-11 | Discharge: 2022-05-11 | Payer: MEDICARE | Attending: Internal Medicine | Primary: Internal Medicine

## 2022-05-11 DIAGNOSIS — I509 Heart failure, unspecified: Secondary | ICD-10-CM

## 2022-05-11 NOTE — Progress Notes (Signed)
Jeffrey [name] (DOB:  Jul 02, 1946) is a 76 y.o. male,Established patient, here for evaluation of the following chief complaint(s):  Arthritis (Pt c/o flare-up. Was in ER in [place] for it and they administered IV prednisone and was better in 6 hours and prescribed prednisone dose pack. Finished rx and flare up is back)         ASSESSMENT/PLAN:  1. Acute on chronic congestive heart failure, unspecified heart failure type (HCC)  2. Paroxysmal atrial fibrillation (HCC)  3. PMR (polymyalgia rheumatica) (HCC)  -     Sedimentation Rate; Future  -     C-Reactive Protein; Future  -     Comprehensive Metabolic Panel; Future      Return in about 4 weeks (around 06/08/2022).     Suspect patient likely has PMR based on symptoms and improvement with prednisone; will check inflammatory markers and then start prednisone if needed and work to find lowest dose that works     Subjective   SUBJECTIVE/OBJECTIVE:  HPI    Patient is a 76 y/o M with a PMHx of Afib, HTN and CHF who presents for follow up.    Returned from [place]; He had an ablation in April and has been doing well since; he is off digoxin; denies any chest pain, swelling, SOA, palpitations     He had an episode in [place] where he had muscle pain all over and had difficulty walking due to pain; he went to the ED and was started on prednisone and this dramatically helped his symptoms significantly but has been off of this now for close to 2 weeks and symptoms returned; he has difficulty lifting his arms; reports he does not have difficulty arising from a chair and reports muscle pain; denies any joint swelling, fever, chills     HTN- BP well controlled    Had labs done in [place]     Review of Systems   Constitutional: Negative.    HENT: Negative.     Eyes: Negative.    Respiratory: Negative.     Cardiovascular: Negative.    Gastrointestinal: Negative.    Genitourinary: Negative.    Musculoskeletal:  Positive for arthralgias and myalgias.   Neurological: Negative.     Psychiatric/Behavioral: Negative.          Objective   Physical Exam  Vitals reviewed.   Constitutional:       General: He is not in acute distress.  HENT:      Head: Normocephalic and atraumatic.   Eyes:      General:         Right eye: No discharge.         Left eye: No discharge.   Cardiovascular:      Rate and Rhythm: Normal rate and regular rhythm.      Pulses: Normal pulses.      Heart sounds: Normal heart sounds.   Pulmonary:      Effort: Pulmonary effort is normal.      Breath sounds: Normal breath sounds.   Musculoskeletal:         General: Tenderness (TTP BL quads; pain with active resistance) present. No swelling.      Comments: Inability to lift arms past 90 degrees   Skin:     General: Skin is warm and dry.   Neurological:      Mental Status: He is alert.   Psychiatric:         Mood and Affect: Mood normal.  Behavior: Behavior normal.                An electronic signature was used to authenticate this note.    --Sanjuan Dame, MD

## 2022-05-12 LAB — COMPREHENSIVE METABOLIC PANEL
ALT: 21 U/L (ref 0–40)
AST: 18 U/L (ref 0–39)
Albumin: 4.3 g/dL (ref 3.5–5.2)
Alkaline Phosphatase: 73 U/L (ref 40–129)
Anion Gap: 10 mmol/L (ref 7–16)
BUN: 22 mg/dL (ref 6–23)
CO2: 30 mmol/L — ABNORMAL HIGH (ref 22–29)
Calcium: 9 mg/dL (ref 8.6–10.2)
Chloride: 96 mmol/L — ABNORMAL LOW (ref 98–107)
Creatinine: 0.9 mg/dL (ref 0.7–1.2)
Est, Glom Filt Rate: >60 mL/min/{1.73_m2} (ref >=60)
Glucose: 86 mg/dL (ref 74–99)
Potassium: 4.3 mmol/L (ref 3.5–5.0)
Sodium: 136 mmol/L (ref 132–146)
Total Bilirubin: 0.6 mg/dL (ref 0.0–1.2)
Total Protein: 7.2 g/dL (ref 6.4–8.3)

## 2022-05-12 LAB — SEDIMENTATION RATE: Sed Rate: 18 mm/Hr — ABNORMAL HIGH (ref 0–15)

## 2022-05-12 LAB — C-REACTIVE PROTEIN: CRP: 1.7 mg/dL — ABNORMAL HIGH (ref 0.0–0.4)

## 2022-05-12 MED ORDER — PREDNISONE 20 MG PO TABS
20 MG | ORAL_TABLET | Freq: Every day | ORAL | 0 refills | Status: AC
Start: 2022-05-12 — End: 2022-05-22

## 2022-05-12 MED ORDER — PREDNISONE 5 MG PO TABS
5 MG | ORAL_TABLET | Freq: Every day | ORAL | 0 refills | Status: AC
Start: 2022-05-12 — End: 2022-05-26

## 2022-05-12 MED ORDER — PREDNISONE 10 MG PO TABS
10 MG | ORAL_TABLET | Freq: Every day | ORAL | 0 refills | Status: AC
Start: 2022-05-12 — End: 2022-05-19

## 2022-05-12 NOTE — Addendum Note (Signed)
Addended by: Sanjuan Dame on: 05/12/2022 07:40 AM     Modules accepted: Orders

## 2022-05-15 NOTE — Telephone Encounter (Signed)
-----   Message from Sanjuan Dame, MD sent at 05/12/2022  7:41 AM EDT -----  Please let patient know his inflammatory marker are elevated so I am sending in prednisone 20 mg to take for 7 days, then 10 mg for 7 days and then 5 mg daily for 2 weeks. If he still has pain with these doses, have him contact office

## 2022-05-15 NOTE — Telephone Encounter (Signed)
Patient advised states understanding

## 2022-06-02 ENCOUNTER — Encounter

## 2022-06-02 ENCOUNTER — Ambulatory Visit: Admit: 2022-06-02 | Discharge: 2022-06-02 | Payer: MEDICARE | Attending: Internal Medicine | Primary: Internal Medicine

## 2022-06-02 DIAGNOSIS — M353 Polymyalgia rheumatica: Secondary | ICD-10-CM

## 2022-06-02 MED ORDER — TADALAFIL 20 MG PO TABS
20 MG | ORAL_TABLET | Freq: Every day | ORAL | 1 refills | Status: DC | PRN
Start: 2022-06-02 — End: 2022-06-26

## 2022-06-02 MED ORDER — PREDNISONE 5 MG PO TABS
5 MG | ORAL_TABLET | Freq: Two times a day (BID) | ORAL | 0 refills | Status: DC
Start: 2022-06-02 — End: 2022-06-26

## 2022-06-02 NOTE — Progress Notes (Signed)
Jeffrey [name] (DOB:  03-26-1946) is a 76 y.o. male,Established patient, here for evaluation of the following chief complaint(s):  Muscle Pain (All over muscle pain, takes the 5 mg of prednisone  BID works and he states the Cialis is not working that well)         ASSESSMENT/PLAN:  1. PMR (polymyalgia rheumatica) (HCC)  -     predniSONE (DELTASONE) 5 MG tablet; Take 1 tablet by mouth 2 times daily, Disp-60 tablet, R-0Normal  -     Sedimentation Rate; Future  -     C-Reactive Protein; Future  2. Benign prostatic hyperplasia with urinary frequency  -     tadalafil (CIALIS) 20 MG tablet; Take 1 tablet by mouth daily as needed for Erectile Dysfunction, Disp-30 tablet, R-1Normal      Return in 3 months (on 09/02/2022) for Due for AWV.     Will increase cialis and if no improvement, will refer to urology    Subjective   SUBJECTIVE/OBJECTIVE:  HPI    Patient is a 76 y/o M with a PMHx of PMR, BPH, ED, CHF and Afib who presents for follow up of PMR.    Reports that taking prednisone 5 mg BID has resolved all his pain- denies any soreness in his shoulder of quads and has no issues getting up out of a chair or lifting his arms    He has not noticed much of a difference with 10 mg of cialis    He will be traveling to [place] next week where his cardiologist is and will undergo watchman procedure; she denies any chest pain, SOA, LE edema and advised her to inform his cardiologist that he is on low dose prednisone as will have to monitor his volume status closely; weight and BP stable    Review of Systems   Constitutional: Negative.    HENT: Negative.     Respiratory: Negative.     Cardiovascular: Negative.    Gastrointestinal: Negative.    Genitourinary:  Positive for difficulty urinating.   Skin: Negative.    Neurological: Negative.    Psychiatric/Behavioral: Negative.          Objective   Physical Exam  Vitals reviewed.   Constitutional:       General: He is not in acute distress.  HENT:      Head: Normocephalic and  atraumatic.   Eyes:      General:         Right eye: No discharge.         Left eye: No discharge.   Cardiovascular:      Rate and Rhythm: Normal rate and regular rhythm.      Pulses: Normal pulses.      Heart sounds: Normal heart sounds.   Pulmonary:      Effort: Pulmonary effort is normal.      Breath sounds: Normal breath sounds.   Musculoskeletal:         General: No swelling or tenderness. Normal range of motion.      Right lower leg: No edema.      Left lower leg: No edema.   Skin:     General: Skin is warm and dry.   Neurological:      General: No focal deficit present.      Mental Status: He is alert and oriented to person, place, and time.   Psychiatric:         Mood and Affect: Mood normal.  Behavior: Behavior normal.              An electronic signature was used to authenticate this note.    --Sanjuan Dame, MD

## 2022-06-03 LAB — SEDIMENTATION RATE: Sed Rate: 6 mm/Hr (ref 0–15)

## 2022-06-03 LAB — C-REACTIVE PROTEIN: CRP: 0.5 mg/dL — ABNORMAL HIGH (ref 0.0–0.4)

## 2022-06-05 ENCOUNTER — Encounter: Payer: MEDICARE | Attending: Internal Medicine | Primary: Internal Medicine

## 2022-06-19 MED ORDER — LOSARTAN POTASSIUM 100 MG PO TABS
100 MG | ORAL_TABLET | Freq: Every day | ORAL | 0 refills | Status: AC
Start: 2022-06-19 — End: 2022-09-04

## 2022-06-19 NOTE — Telephone Encounter (Signed)
REFILL 

## 2022-06-21 MED ORDER — POTASSIUM CHLORIDE ER 10 MEQ PO TBCR
10 MEQ | ORAL_TABLET | ORAL | 3 refills | Status: DC
Start: 2022-06-21 — End: 2022-09-04

## 2022-06-23 ENCOUNTER — Emergency Department: Admit: 2022-06-23 | Payer: MEDICARE | Primary: Internal Medicine

## 2022-06-23 ENCOUNTER — Inpatient Hospital Stay
Admit: 2022-06-23 | Discharge: 2022-06-23 | Disposition: A | Discharge status: Home / Self Care | Payer: MEDICARE | Attending: Emergency Medicine

## 2022-06-23 DIAGNOSIS — R002 Palpitations: Secondary | ICD-10-CM

## 2022-06-23 LAB — TSH: TSH: 1.94 u[IU]/mL (ref 0.270–4.200)

## 2022-06-23 LAB — CBC
Hematocrit: 43.1 % (ref 37.0–54.0)
Hemoglobin: 14.1 g/dL (ref 12.5–16.5)
MCH: 28.8 pg (ref 26.0–35.0)
MCHC: 32.7 % (ref 32.0–34.5)
MCV: 88 fL (ref 80.0–99.9)
MPV: 9.4 fL (ref 7.0–12.0)
Platelets: 317 E9/L (ref 130–450)
RBC: 4.9 E12/L (ref 3.80–5.80)
RDW: 14.8 fL (ref 11.5–15.0)
WBC: 10.2 E9/L (ref 4.5–11.5)

## 2022-06-23 LAB — BASIC METABOLIC PANEL
Anion Gap: 12 mmol/L (ref 7–16)
BUN: 19 mg/dL (ref 6–23)
CO2: 26 mmol/L (ref 22–29)
Calcium: 9 mg/dL (ref 8.6–10.2)
Chloride: 100 mmol/L (ref 98–107)
Creatinine: 0.9 mg/dL (ref 0.7–1.2)
Est, Glom Filt Rate: >60 mL/min/{1.73_m2} (ref >=60)
Glucose: 99 mg/dL (ref 74–99)
Potassium: 4.2 mmol/L (ref 3.5–5.0)
Sodium: 138 mmol/L (ref 132–146)

## 2022-06-23 LAB — EKG 12-LEAD
Atrial Rate: 82 {beats}/min
P Axis: 50 degrees
P-R Interval: 138 ms
Q-T Interval: 374 ms
QRS Duration: 142 ms
QTc Calculation (Bazett): 436 ms
R Axis: -22 degrees
T Axis: 29 degrees
Ventricular Rate: 82 {beats}/min

## 2022-06-23 LAB — BRAIN NATRIURETIC PEPTIDE: Pro-BNP: 393 pg/mL (ref 0–450)

## 2022-06-23 LAB — TROPONIN: Troponin, High Sensitivity: 17 ng/L — ABNORMAL HIGH (ref 0–11)

## 2022-06-23 MED ORDER — METOPROLOL TARTRATE 25 MG PO TABS
25 MG | ORAL_TABLET | Freq: Two times a day (BID) | ORAL | 0 refills | Status: DC
Start: 2022-06-23 — End: 2022-06-26

## 2022-06-23 MED ORDER — METOPROLOL TARTRATE 25 MG PO TABS
25 MG | Freq: Two times a day (BID) | ORAL | Status: DC
Start: 2022-06-23 — End: 2022-06-23
  Administered 2022-06-23: 18:00:00 25 mg via ORAL

## 2022-06-23 MED FILL — METOPROLOL TARTRATE 25 MG PO TABS: 25 MG | ORAL | Qty: 1

## 2022-06-23 NOTE — ED Provider Notes (Incomplete)
Presents emergency department complaint of bradycardia.  He says that yesterday he was tachycardic and did not feel well.  Today he is having periods of bradycardia down to 30 bpm.  He states that he had some chest discomfort along with that and felt short of breath and lightheaded.  Patient has history of atrial fibrillation and has had ablations in the past.  Patient states he was recently switched from Eliquis to Plavix.  He denies any increase in peripheral edema.        Review of Systems   Constitutional:  Positive for activity change and fatigue. Negative for appetite change, chills, diaphoresis and fever.   HENT: Negative.     Respiratory:  Negative for chest tightness and shortness of breath.    Cardiovascular:  Positive for chest pain and palpitations. Negative for leg swelling.   Gastrointestinal:  Negative for abdominal pain, diarrhea, nausea and vomiting.   Genitourinary: Negative.    Musculoskeletal: Negative.    Skin: Negative.    Neurological:  Positive for light-headedness. Negative for syncope and weakness.   Hematological: Negative.    Psychiatric/Behavioral: Negative.       Physical Exam  Vitals and nursing note reviewed.   Constitutional:       General: He is not in acute distress.     Appearance: Normal appearance. He is well-developed and normal weight. He is not ill-appearing, toxic-appearing or diaphoretic.   HENT:      Head: Normocephalic and atraumatic.      Nose: Nose normal.      Mouth/Throat:      Mouth: Mucous membranes are moist.      Pharynx: Oropharynx is clear.   Eyes:      Extraocular Movements: Extraocular movements intact.      Conjunctiva/sclera: Conjunctivae normal.      Pupils: Pupils are equal, round, and reactive to light.   Cardiovascular:      Rate and Rhythm: Bradycardia present. Rhythm irregular.      Pulses: Normal pulses.      Heart sounds: Normal heart sounds. No murmur heard.  Pulmonary:      Effort: Pulmonary effort is normal. No respiratory distress.      Breath  sounds: Normal breath sounds. No wheezing or rales.   Abdominal:      General: Bowel sounds are normal.      Palpations: Abdomen is soft.      Tenderness: There is no abdominal tenderness. There is no guarding or rebound.   Musculoskeletal:         General: No swelling or tenderness. Normal range of motion.      Cervical back: Normal range of motion and neck supple.      Right lower leg: No edema.      Left lower leg: No edema.   Skin:     General: Skin is warm and dry.      Capillary Refill: Capillary refill takes less than 2 seconds.      Coloration: Skin is not pale.   Neurological:      General: No focal deficit present.      Mental Status: He is alert and oriented to person, place, and time. Mental status is at baseline.      Cranial Nerves: No cranial nerve deficit.      Coordination: Coordination normal.   Psychiatric:         Mood and Affect: Mood normal.         Behavior: Behavior normal.  Procedures    Medical Decision Making  Amount and/or Complexity of Data Reviewed  Labs: ordered.  Radiology: ordered.  ECG/medicine tests: ordered.             EKG Interpretation    Interpreted by emergency department physician    Rhythm: normal sinus   Rate: normal  Axis: normal  Ectopy: bigeminy  Conduction: R BBB  ST Segments: no acute change  T Waves: no acute change  Q Waves: none    Clinical Impression: non-specific EKG    Susy Manor, DO

## 2022-06-23 NOTE — Consults (Signed)
INPATIENT CARDIOLOGY CONSULT     Reason for Consult: Bigeminy, Bradycardia, PAT    Cardiologist: Dr. Candie Mile    Requesting Physician: Dr. Corliss Marcus    Date of Consultation: 06/23/2022    HISTORY OF PRESENT ILLNESS:   Patient is a 76 year old WM known to Dr. Arnette Norris.  He lives in [place] seven months out of the year, and follows with Cardiology/EP there.    He has a known past medical history of obesity, HTN, HLD, PAF s/p AF Ablation x 2 and Watchman procedure 05/2022 -- all in [facility], chronic HFpEF, family history of early onset CAD, GERD, ED, polymyalgia rheumatica and BPH.    He presented to [facility] on June 23, 2022 with complaints of palpitations and generalized fatigue/weakness.  Per patient, as noted above he lives in [place] seven months out of the year.  He recently underwent Watchman procedure in [facility] on 06/09/2022 (limited documents available for review in Care Everywhere).  He additionally states he has underwent two AF ablations in [place] (further details below), and since then certain medications have been discontinued.  Beginning yesterday, patient began to note of the above symptoms.  He checked his BP and HR on home cuff/monitor, and noted of bradycardia with HR in the 30s.  Today, his monitor showed tachycardia, thus prompting him to present to the ED.  He notes of palpitations during brief episodes of PAT on telemetry.  Denies chest pain, dyspnea, dizziness, near syncope, syncope, PND, orthopnea or peripheral edema.  States he is no longer on OAC, but takes Plavix 75 mg daily.        Please note: past medical records were reviewed per electronic medical record (EMR) - see detailed reports under Past Medical/ Surgical History.   PAST MEDICAL HISTORY:    Obesity, BMI 31  HTN  HLD, on statin therapy  PAF, prior OAC with Eliquis   S/p AF Ablation x 2 (both in [facility], first procedure "a couple of years ago"; second within the past year)  S/p Watchman procedure in  [place] 06/09/2022  Chronic HFpEF   Family history of early onset CAD (father passed away from MI age 15)  GERD   ED, on Cialis   Polymyalgia rheumatica  BPH      CARDIAC TESTING    TTE (Dr. Ruthine Dose) 08/2019  Summary  Technically difficult examination. Enhancing contrast was used to define  endocardial borders.  Normal left ventricular chamber size.  Normal left ventricular systolic function.  Visually estimated LVEF is 60-65 %.  No wall motion abnormalities.  Normal right ventricle structure and function.  No significant valvular abnormalities.  No comparison study available.    TTE (Dr. Arnette Norris) 07/24/2021  Summary  No previous echo for comparison. Technically adequate study.  Left ventricle size is normal.  Mild concentric left ventricular hypertrophy.  Ejection fraction is visually estimated at 55%.  No regional wall motion abnormalities seen.  There is doppler evidence of stage II diastolic dysfunction.  Physiologic and/or trace tricuspid regurgitation.  RVSP is 37 mmHg.  Pulmonary hypertension is mild.    [facility] at time of Watchman, 06/09/2022)  Findings discussed with Dr. Lysle Morales. L.V.: The visually estimated EF is 60-65%. Left ventricular diastolic function is grade I dysfunction. RWMA: There are no regional wall motion abnormalities. R.V.: Normal right ventricle size and function. L.A.: The left atrium is normal. There is no thrombus identified in the left atrial appendage. R.A.: The right atrium is normal.  I.A.S.: The interatrial septum appears normal. The interatrial septum appears intact (no ASD or shunt). I.V.S.: Interventricular septum morphology: moderately hypertrophied. Pericardium.: The pericardium is normal. Pleural.: There is no pleural effusion. Ao.: The descending aorta thickness suggests intimal thickening. A.V.: The aortic valve is trileaflet, structurally normal, without evidence of stenosis or regurgitation. M.V.: The mitral valve is structurally normal, without evidence of  stenosis with mild regurgitation. T.V.: The tricuspid valve is structurally normal with no evidence of regurgitation or stenosis. P.V.: The pulmonic valve is structured normally with no evidence of stenosis with trace regurgitation.    PAST SURGICAL HISTORY:    Past Surgical History:   Procedure Laterality Date    APPENDECTOMY      HERNIA REPAIR         HOME MEDICATIONS:  Prior to Admission medications    Medication Sig Start Date End Date Taking? Authorizing Provider   metoprolol tartrate (LOPRESSOR) 25 MG tablet Take 1 tablet by mouth 2 times daily 06/23/22  Yes Jill Stefanucci-Uberti, DO   losartan (COZAAR) 100 MG tablet Take 1 tablet by mouth daily 06/19/22   Moss Mc, MD   clopidogrel (PLAVIX) 75 MG tablet  06/09/22   Historical Provider, MD   potassium chloride (KLOR-CON 10) 10 MEQ extended release tablet Take 1 tablet by mouth every other day 06/21/22   [name], MD   digoxin (LANOXIN) 125 MCG tablet Take by mouth 09/29/21   Historical Provider, MD   predniSONE (DELTASONE) 5 MG tablet Take 1 tablet by mouth 2 times daily 06/02/22 07/02/22  Moss Mc, MD   tadalafil (CIALIS) 20 MG tablet Take 1 tablet by mouth daily as needed for Erectile Dysfunction 06/02/22 08/01/22  Moss Mc, MD   Apoaequorin (PREVAGEN PO) Take 1 tablet by mouth daily    Historical Provider, MD   tiZANidine (ZANAFLEX) 2 MG tablet Take 1 tablet by mouth as needed    Historical Provider, MD   hydrOXYzine HCl (ATARAX) 25 MG tablet Take 1 tablet by mouth as needed for Itching    Historical Provider, MD   furosemide (LASIX) 40 MG tablet Take 1 tablet by mouth daily 06/17/21   [name], MD   apixaban (ELIQUIS) 5 MG TABS tablet Take 1 tablet by mouth 2 times daily 08/20/20   [name], MD   atorvastatin (LIPITOR) 20 MG tablet Take 1 tablet by mouth nightly 09/06/19   Burton Apley, DO   omeprazole (PRILOSEC) 20 MG delayed release capsule Take 1 capsule by mouth daily    Historical Provider, MD       CURRENT MEDICATIONS:    No current  facility-administered medications for this encounter.    Current Outpatient Medications:     metoprolol tartrate (LOPRESSOR) 25 MG tablet, Take 1 tablet by mouth 2 times daily, Disp: 30 tablet, Rfl: 0    losartan (COZAAR) 100 MG tablet, Take 1 tablet by mouth daily, Disp: 90 tablet, Rfl: 0    clopidogrel (PLAVIX) 75 MG tablet, , Disp: , Rfl:     potassium chloride (KLOR-CON 10) 10 MEQ extended release tablet, Take 1 tablet by mouth every other day, Disp: 60 tablet, Rfl: 3    digoxin (LANOXIN) 125 MCG tablet, Take by mouth, Disp: , Rfl:     predniSONE (DELTASONE) 5 MG tablet, Take 1 tablet by mouth 2 times daily, Disp: 60 tablet, Rfl: 0    tadalafil (CIALIS) 20 MG tablet, Take 1 tablet by mouth daily as needed for Erectile Dysfunction, Disp: 30 tablet, Rfl:  1    Apoaequorin (PREVAGEN PO), Take 1 tablet by mouth daily, Disp: , Rfl:     tiZANidine (ZANAFLEX) 2 MG tablet, Take 1 tablet by mouth as needed, Disp: , Rfl:     hydrOXYzine HCl (ATARAX) 25 MG tablet, Take 1 tablet by mouth as needed for Itching, Disp: , Rfl:     furosemide (LASIX) 40 MG tablet, Take 1 tablet by mouth daily, Disp: 90 tablet, Rfl: 2    apixaban (ELIQUIS) 5 MG TABS tablet, Take 1 tablet by mouth 2 times daily, Disp: 56 tablet, Rfl: 0    atorvastatin (LIPITOR) 20 MG tablet, Take 1 tablet by mouth nightly, Disp: 30 tablet, Rfl: 2    omeprazole (PRILOSEC) 20 MG delayed release capsule, Take 1 capsule by mouth daily, Disp: , Rfl:     ALLERGIES:  Lidocaine-epinephrine, Cephalexin, and Sulfa antibiotics    SOCIAL HISTORY:    Drinks social alcohol.  Denies illicit drug or tobacco abuse.  Does not require assistance with ambulation.     FAMILY HISTORY:   Family history of early onset CAD (father passed away from MI age 49).      REVIEW OF SYSTEMS:     Negative except as noted above in HPI.      PHYSICAL EXAM:   BP 126/73   Pulse 68   Temp 98.3 F (36.8 C) (Oral)   Resp 20   Wt 208 lb (94.3 kg)   SpO2 96%   BMI 31.63 kg/m   CONST:  Well developed,  well nourished WM who appears stated age. Awake, alert, cooperative, no apparent distress.  HEENT:   Head- Normocephalic, atraumatic.   Eyes- Conjunctivae pink, anicteric.  Neck-  No stridor, trachea midline, no apparent jugular venous distention.   CHEST: Chest symmetrical and non-tender to palpation. No accessory muscle use or intercostal retractions.  RESPIRATORY: Lung sounds - clear throughout fields. No wheezing, rales or rhonchi.  CARDIOVASCULAR:     No noted carotid bruit.  Heart Ausculation- Irregular rhythm with normal rate, no apparent murmur.   PV: No lower extremity edema. +varicosities. Pedal pulses palpable, no clubbing or cyanosis.   ABDOMEN: Soft, non-tender to light palpation. Bowel sounds present.   MS: Good muscle strength and tone. No atrophy or abnormal movements.   SKIN: Warm and dry.   NEURO / PSYCH: Oriented to person, place and time. Speech clear and appropriate. Follows all commands. Pleasant affect.      DATA:    Telemetry: SR HR in the 70s.  Frequent PACs/PVCs with episodes of bigeminy.  Brief runs of paroxysmal AT.  No evidence of bradycardia.    Diagnostic:  All diagnostic testing and lab work thus far this admission reviewed in detail.    CXR 06/23/2022  IMPRESSION:  1. There is no acute cardiopulmonary disease.    Labs:   CBC:   Recent Labs     06/23/22  1035   WBC 10.2   HGB 14.1   HCT 43.1   PLT 317     BMP:   Recent Labs     06/23/22  1035   NA 138   K 4.2   CO2 26   BUN 19   CREATININE 0.9   LABGLOM >60   CALCIUM 9.0     TSH:   Recent Labs     06/23/22  1035   TSH 1.940     HgA1c:   Lab Results   Component Value Date    LABA1C 5.3 09/05/2019  FASTING LIPID PANEL:  Lab Results   Component Value Date/Time    CHOL 177 09/05/2019 05:44 AM    HDL 34 09/05/2019 05:44 AM    LDLCALC 107 09/05/2019 05:44 AM    TRIG 182 09/05/2019 05:44 AM     Component Ref Range & Units 06/23/22 1035 10/04/21 2225 10/04/21 2117   Troponin, High Sensitivity 0 - 11 ng/L 17 High   17 High  CM  17 High  CM       Component Ref Range & Units 06/23/22 1035 06/14/21 0939 09/04/19 1707   Pro-BNP 0 - 450 pg/mL 393  228  106          ASSESSMENT:  Ventricular/supraventricular bigeminy  Brief episodes of PAT, symptomatic (palpitations)  PAF, prior OAC with Eliquis   S/p AF Ablation x 2 (both in [facility] [place], first procedure "a couple of years ago"; second within the past year)  [place]  Chronic HFpEF, appears euvolemic  Family history of early onset CAD (father passed away from MI age 77)  HTN, controlled  HLD, on statin therapy   Obesity, BMI 31  GERD  ED, on Cialis   Polymyalgia rheumatica  BPH      RECOMMENDATIONS:  Start Lopressor 25 mg BID  Continue other cardiac medications the same   Results of recent cardiac testing reviewed  No additional testing ordered   Follow up with cardiology/EP as scheduled  Above as per Dr. Candie MileScrocco -- A+P discussed and made in collaboration with him.  Further recommendations to follow      NOTE: This report was transcribed using voice recognition software. Every effort was made to ensure accuracy; however, inadvertent computerized transcription errors may be present.      Corey HaroldBrittany Mihaly, MMS, [facility]  [facility]    Electronically signed by Corey HaroldBRITTANY MIHALY, PA-C on 06/23/2022 at 2:02 PM      -----------------------------------------------------------------------------------------------------------------------------------------------  CARDIOLOGY ATTENDING ATTESTATION ([facility]. [facility])  I spent > 51% of the total time involved with completing the encounter. The total time included the following:  Independently interviewing the patient (HPI, ROS, PMH, PSH, FMH, SH, allergies, and medications)  Independently performing a medically appropriate examination  Reviewing the above documentation completed by the APP  Ordering medications, tests, and/or procedures  Formulating the assessment/plan and reviewing the rationale for the above recommendations  Reviewing available  records, results of all previously ordered testing/procedures, and current problem list  Counseling/educating the patient  Coordinating care with other healthcare professionals  Communicating results to the patient's family/caregiver  Documenting clinical information in the patient's electronic health record  -----------------------------------------------------------------------------------------------------------------------------------------------    Currently with no chest pain, respiratory distress, or palpitations. SR with PAC's/PVC's on EKG and telemetry.    Review of Systems:   Cardiac: As per HPI  General: No fever, chills  Pulmonary: As per HPI  HEENT: No visual disturbances, difficult swallowing  GI: No nausea, vomiting  GU: No dysuria, hematuria  Endocrine: No thyroid disease or DM  Musculoskeletal: MAE x 4, no focal motor deficits  Skin: Intact, no rashes  Neuro: No headache, seizures  Psych: Currently with no depression, anxiety    Physical Exam:  BP 126/73   Pulse 68   Temp 98.3 F (36.8 C) (Oral)   Resp 20   Wt 208 lb (94.3 kg)   SpO2 96%   BMI 31.63 kg/m   Wt Readings from Last 3 Encounters:   06/23/22 208 lb (94.3 kg)   06/02/22 208  lb 1.6 oz (94.4 kg)   05/11/22 207 lb 6.4 oz (94.1 kg)     Appearance: Awake, alert, no acute respiratory distress  Skin: Intact, no rash  Head: Normocephalic, atraumatic  Eyes: EOMI, no conjunctival erythema  ENMT: No pharyngeal erythema, MMM, no rhinorrhea  Neck: Supple, no elevated JVP, no carotid bruits  Lungs: Clear to auscultation bilaterally. No wheezes, rales, or rhonchi.  Cardiac: Regular rate and rhythm, +S1S2, no murmurs apparent  Abdomen: Soft, nontender, +bowel sounds  Extremities: Moves all extremities x 4, no lower extremity edema  Neurologic: No focal motor deficits apparent, normal mood and affect    Laboratory Tests:  Recent Labs     06/23/22  1035   NA 138   K 4.2   CL 100   CO2 26   BUN 19   CREATININE 0.9   GLUCOSE 99   CALCIUM 9.0     Lab  Results   Component Value Date/Time    MG 2.1 09/04/2019 05:07 PM     No results for input(s): ALKPHOS, ALT, AST, PROT, BILITOT, BILIDIR, LABALBU in the last 72 hours.  Recent Labs     06/23/22  1035   WBC 10.2   RBC 4.90   HGB 14.1   HCT 43.1   MCV 88.0   MCH 28.8   MCHC 32.7   RDW 14.8   PLT 317   MPV 9.4     Lab Results   Component Value Date    TROPONINI <0.01 09/05/2019    TROPONINI <0.01 09/04/2019    TROPONINI <0.01 09/04/2019     Recent Labs     06/23/22  1035   TROPHS 17*     Lab Results   Component Value Date    TSH 1.940 06/23/2022     Lab Results   Component Value Date    LABA1C 5.3 09/05/2019     No results found for: EAG  Lab Results   Component Value Date    CHOL 177 09/05/2019     Lab Results   Component Value Date    TRIG 182 (H) 09/05/2019     Lab Results   Component Value Date    HDL 34 09/05/2019     Lab Results   Component Value Date    LDLCALC 107 (H) 09/05/2019     Lab Results   Component Value Date    LABVLDL 36 09/05/2019     No results found for: CHOLHDLRATIO  Recent Labs     06/23/22  1035   PROBNP 393       Telemetry: SR, ventricular bigeminy / episodes of atrial tachycardia    - Will restart beta blocker  - Continue current medications otherwise  - Available cardiology records from [place] reviewed today (recent Watchman implantation)          Sheran Spine, MD  [facility]

## 2022-06-26 ENCOUNTER — Ambulatory Visit: Admit: 2022-06-26 | Discharge: 2022-06-26 | Payer: MEDICARE | Attending: Internal Medicine | Primary: Internal Medicine

## 2022-06-26 DIAGNOSIS — I480 Paroxysmal atrial fibrillation: Secondary | ICD-10-CM

## 2022-06-26 MED ORDER — PREDNISONE 5 MG PO TABS
5 MG | ORAL_TABLET | Freq: Two times a day (BID) | ORAL | 3 refills | Status: DC
Start: 2022-06-26 — End: 2022-09-04

## 2022-06-26 MED ORDER — TADALAFIL 20 MG PO TABS
20 MG | ORAL_TABLET | Freq: Every day | ORAL | 1 refills | Status: AC | PRN
Start: 2022-06-26 — End: 2022-08-25

## 2022-06-26 MED ORDER — METOPROLOL TARTRATE 25 MG PO TABS
25 MG | ORAL_TABLET | Freq: Two times a day (BID) | ORAL | 3 refills | Status: AC
Start: 2022-06-26 — End: 2023-05-01

## 2022-06-26 NOTE — Telephone Encounter (Signed)
Pt calling for ER apt/timing with Dr. Arnette Norris from 06/23/22 dx: Bigeminy/Bradycardia/PAT.

## 2022-06-26 NOTE — Telephone Encounter (Signed)
Called patient left voicemail with scheduling

## 2022-06-26 NOTE — Progress Notes (Signed)
Jeffrey [name] (DOB:  [birth year]) is a 76 y.o. male,Established patient, here for evaluation of the following chief complaint(s):  Follow-up (Ed follow up for atrial fib prednisone working great/Bruising )         ASSESSMENT/PLAN:  1. Paroxysmal atrial fibrillation (HCC)  -     metoprolol tartrate (LOPRESSOR) 25 MG tablet; Take 1 tablet by mouth 2 times daily, Disp-90 tablet, R-3Normal  2. Benign prostatic hyperplasia with urinary frequency  -     tadalafil (CIALIS) 20 MG tablet; Take 1 tablet by mouth daily as needed for Erectile Dysfunction, Disp-30 tablet, R-1Normal  3. PMR (polymyalgia rheumatica) (HCC)  -     predniSONE (DELTASONE) 5 MG tablet; Take 1 tablet by mouth 2 times daily, Disp-180 tablet, R-3Normal      No follow-ups on file.     Patient in NSR on exam and asymptomatic; he will follow up with Dr. Criselda Peaches    Subjective   SUBJECTIVE/OBJECTIVE:  HPI    Patient is a 76 y/o M with a PMHx of PMR and atrial fibrillation who presents for follow up.     He was seen in the ED for bradycardia and palpitations; he was seen by cardiology in the ED and on telemetry had a few episodes of atrial fibrillation and so was started on metoprolol 25 mg twice a day and has noticed that he is not having any chest pain, palpitations or heart racing; he had been taken off eliquis and switched to plavix and ASA; he was advised by cardiology to continue same medications and he will follow up with Dr. Criselda Peaches    He states that prednisone is working well and he is going to try to decrease to only 5 mg a day    Review of Systems   Constitutional: Negative.    HENT: Negative.     Respiratory: Negative.     Cardiovascular: Negative.    Gastrointestinal: Negative.    Musculoskeletal: Negative.    Hematological:  Bruises/bleeds easily.   Psychiatric/Behavioral: Negative.          Objective   Physical Exam  Constitutional:       General: He is not in acute distress.  HENT:      Head: Normocephalic and atraumatic.   Eyes:      General:          Right eye: No discharge.         Left eye: No discharge.   Cardiovascular:      Rate and Rhythm: Normal rate and regular rhythm.      Pulses: Normal pulses.      Heart sounds: Normal heart sounds.   Pulmonary:      Effort: Pulmonary effort is normal.      Breath sounds: Normal breath sounds.   Skin:     Findings: Bruising present.   Neurological:      General: No focal deficit present.      Mental Status: He is alert and oriented to person, place, and time.   Psychiatric:         Mood and Affect: Mood normal.         Behavior: Behavior normal.              An electronic signature was used to authenticate this note.    --Moss Mc, MD

## 2022-07-04 ENCOUNTER — Encounter

## 2022-08-01 ENCOUNTER — Ambulatory Visit: Admit: 2022-08-01 | Discharge: 2022-08-01 | Payer: MEDICARE | Attending: Internal Medicine | Primary: Internal Medicine

## 2022-08-01 DIAGNOSIS — I509 Heart failure, unspecified: Secondary | ICD-10-CM

## 2022-08-01 NOTE — Progress Notes (Signed)
[facility] Progress Note  Dr. [name]      Referring Physician: Moss Mc, MD  CHIEF COMPLAINT:   Chief Complaint   Patient presents with    Hypertension    Follow-Up from Hospital       HISTORY OF PRESENT ILLNESS:   Patient is 76 years old male with history of atrial fibrillation s/p ablation, Watchman device implant, hypertension and bradycardia. Here for follow up appointment.  Patient denies any chest pain, no shortness of breath, no lightheadedness, no dizziness, no palpitations, no pedal edema, no PND, no orthopnea, no syncope, no presyncopal episodes.  Pressure device is at baseline    Past Medical History:   Diagnosis Date    Arrhythmia     Hyperlipidemia     Hypertension          Past Surgical History:   Procedure Laterality Date    APPENDECTOMY      HERNIA REPAIR           Current Outpatient Medications   Medication Sig Dispense Refill    aspirin 81 MG EC tablet Take 1 tablet by mouth daily      tadalafil (CIALIS) 20 MG tablet Take 1 tablet by mouth daily as needed for Erectile Dysfunction 30 tablet 1    predniSONE (DELTASONE) 5 MG tablet Take 1 tablet by mouth 2 times daily 180 tablet 3    metoprolol tartrate (LOPRESSOR) 25 MG tablet Take 1 tablet by mouth 2 times daily 90 tablet 3    potassium chloride (KLOR-CON 10) 10 MEQ extended release tablet Take 1 tablet by mouth every other day 60 tablet 3    losartan (COZAAR) 100 MG tablet Take 1 tablet by mouth daily 90 tablet 0    tiZANidine (ZANAFLEX) 2 MG tablet Take 1 tablet by mouth as needed      hydrOXYzine HCl (ATARAX) 25 MG tablet Take 1 tablet by mouth as needed for Itching      furosemide (LASIX) 40 MG tablet Take 1 tablet by mouth daily 90 tablet 2    atorvastatin (LIPITOR) 20 MG tablet Take 1 tablet by mouth nightly 30 tablet 2    omeprazole (PRILOSEC) 20 MG delayed release capsule Take 1 capsule by mouth daily      clopidogrel (PLAVIX) 75 MG tablet  (Patient not taking: Reported on 08/01/2022)       No current facility-administered  medications for this visit.         Allergies as of 08/01/2022 - Fully Reviewed 08/01/2022   Allergen Reaction Noted    Lidocaine-epinephrine  04/28/2021    Cephalexin Hives and Rash 04/28/2021    Sulfa antibiotics Rash 09/04/2019       Social History     Socioeconomic History    Marital status: Married     Spouse name: Not on file    Number of children: 1    Years of education: Not on file    Highest education level: Not on file   Occupational History    Not on file   Tobacco Use    Smoking status: Never    Smokeless tobacco: Never   Vaping Use    Vaping Use: Never used   Substance and Sexual Activity    Alcohol use: Yes     Alcohol/week: 2.0 standard drinks     Types: 2 Shots of liquor per week     Comment: 2 drinks vodka a day     Drug use: Never  Sexual activity: Not on file   Other Topics Concern    Not on file   Social History Narrative    Not on file     Social Determinants of Health     Financial Resource Strain: Low Risk     Difficulty of Paying Living Expenses: Not hard at all   Food Insecurity: No Food Insecurity    Worried About Running Out of Food in the Last Year: Never true    Ran Out of Food in the Last Year: Never true   Transportation Needs: Unknown    Lack of Transportation (Medical): Not on file    Lack of Transportation (Non-Medical): No   Physical Activity: Not on file   Stress: Not on file   Social Connections: Not on file   Intimate Partner Violence: Not on file   Housing Stability: Unknown    Unable to Pay for Housing in the Last Year: Not on file    Number of Places Lived in the Last Year: Not on file    Unstable Housing in the Last Year: No       Family History   Problem Relation Age of Onset    Heart Attack Father        REVIEW OF SYSTEMS:   CONSTITUTIONAL:  negative for  fevers, chills, sweats and fatigue  HEENT:  negative for  tinnitus, earaches, nasal congestion and epistaxis  RESPIRATORY:  negative for  dry cough, cough with sputum,wheezing and hemoptysis  GASTROINTESTINAL:   negative for nausea, vomiting, diarrhea, constipation, pruritus and jaundice  HEMATOLOGIC/LYMPHATIC:  negative for easy bruising, bleeding, lymphadenopathy and petechiae  ENDOCRINE:  negative for heat intolerance, cold intolerance, tremor, hair loss and diabetic symptoms including neither polyuria nor polydipsia nor blurred vision  MUSCULOSKELETAL:  negative for  myalgias, arthralgias, joint swelling, stiff joints and decreased range of motion  NEUROLOGICAL:  negative for memory problems, speech problems, visual disturbance, dysphagia, weakness and numbness      PHYSICAL EXAM:   Constitutional:  Awake, alert cooperative, no apparent distress, and appears stated age.   HEENT:  Moist and pink mucous membranes, normocephalic, without obvious abnormality, atraumatic, normal ears and nose.   NECK:  Supple, symmetrical, trachea midline, no JVD, no adenopathy, thyroid symmetric, not enlarged and no tenderness, good carotid upstroke bilaterally, no carotid bruit, skin normal.   LUNGS: No increased work of breathing, good air exchange, clear to auscultation bilaterally, no crackles or wheezing.  Cardiovascular: Normal apical impulse, regular rate and rhythm, normal S1 and S2, no S3 or S4, 2/6 systolic murmur at the apex, 2/6 systolic murmur at the left lower sternal border, no pedal edema, good carotid upstroke bilaterally, no carotid bruit, no JVD, no abdominal pulsating masses.   ABDOMEN: Obese, Soft, nontender, no hepatomegaly, no splenomegaly, bowel sound positive.   CHEST:  Expands symmetrically, nontender to palpation.  Musculoskeletal:  No clubbing or cyanosis.  No redness, warmth, or swelling of the joints.  Neurological: Alert, awake, and oriented X3.   SKIN: No bruises, no bleeding, normal skin color, texture, turgor and no redness, warmth or swelling.      BP 128/72   Pulse 70   Resp 16   Ht 5\' 8"  (1.727 m)   Wt 208 lb (94.3 kg)   BMI 31.63 kg/m     DATA:   I personally reviewed the visit EKG with the  following interpretation: Sinus rhythm, right bundle branch block, normal axis    EKG 09/15/19  Sinus rhythm  ECHO: TEE [facility] at time of Watchman, 06/09/2022)  Findings discussed with Dr. Lysle Morales. L.V.: The visually estimated EF is 60-65%. Left ventricular diastolic function is grade I dysfunction. RWMA: There are no regional wall motion abnormalities. R.V.: Normal right ventricle size and function. L.A.: The left atrium is normal. There is no thrombus identified in the left atrial appendage. R.A.: The right atrium is normal. I.A.S.: The interatrial septum appears normal. The interatrial septum appears intact (no ASD or shunt). I.V.S.: Interventricular septum morphology: moderately hypertrophied. Pericardium.: The pericardium is normal. Pleural.: There is no pleural effusion. Ao.: The descending aorta thickness suggests intimal thickening. A.V.: The aortic valve is trileaflet, structurally normal, without evidence of stenosis or regurgitation. M.V.: The mitral valve is structurally normal, without evidence of stenosis with mild regurgitation. T.V.: The tricuspid valve is structurally normal with no evidence of regurgitation or stenosis. P.V.: The pulmonic valve is structured normally with no evidence of stenosis with trace regurgitation.    07/24/2021  Summary  No previous echo for comparison. Technically adequate study.  Left ventricle size is normal.  Mild concentric left ventricular hypertrophy.  Ejection fraction is visually estimated at 55%.  No regional wall motion abnormalities seen.  There is doppler evidence of stage II diastolic dysfunction.  Physiologic and/or trace tricuspid regurgitation.  RVSP is 37 mmHg.  Pulmonary hypertension is mild.    09/05/19 Summary   Technically difficult examination. Enhancing contrast was used to define   endocardial borders.   Normal left ventricular chamber size.   Normal left ventricular systolic function.   Visually estimated LVEF is 60-65 %.   No wall motion  abnormalities.   Normal right ventricle structure and function.   No significant valvular abnormalities.   No comparison study available.    Stress Test: Cardiac Testing:  EKG - (06/22/2017) Sinus bradycardia.  Normal ECG.  Echocardiogram - (06/26/2017) Normal left ventricular systolic function with stage I diastolic dysfunction.  Trace mitral valve regurgitation.  Trace tricuspid valve regurgitation with RVSP of 30 mmHg.  Stress Test - (07/05/2017) Negative Lexiscan stress test for ischemic symptoms or  ischemic EKG changes.  Normal Cardiolite perfusion scan without evidence of fixed or reversible defect.  Normal left ventricular systolic function with normal wall motion.  There is no old comparison study.  The results of the study predict low probability for significant coronary artery disease or future cardiac events.    Angiography:    Cardiology Labs: BMP:    Lab Results   Component Value Date/Time    NA 138 06/23/2022 10:35 AM    K 4.2 06/23/2022 10:35 AM    K 3.6 10/04/2021 09:17 PM    CL 100 06/23/2022 10:35 AM    CO2 26 06/23/2022 10:35 AM    BUN 19 06/23/2022 10:35 AM    CREATININE 0.9 06/23/2022 10:35 AM     CMP:    Lab Results   Component Value Date/Time    NA 138 06/23/2022 10:35 AM    K 4.2 06/23/2022 10:35 AM    K 3.6 10/04/2021 09:17 PM    CL 100 06/23/2022 10:35 AM    CO2 26 06/23/2022 10:35 AM    BUN 19 06/23/2022 10:35 AM    CREATININE 0.9 06/23/2022 10:35 AM    PROT 7.2 05/11/2022 11:11 AM     CBC:    Lab Results   Component Value Date/Time    WBC 10.2 06/23/2022 10:35 AM    RBC 4.90 06/23/2022 10:35 AM    HGB  14.1 06/23/2022 10:35 AM    HCT 43.1 06/23/2022 10:35 AM    MCV 88.0 06/23/2022 10:35 AM    RDW 14.8 06/23/2022 10:35 AM    PLT 317 06/23/2022 10:35 AM     PT/INR:  No results found for: PTINR  PT/INR Warfarin:  No components found for: PTPATWAR, PTINRWAR  PTT:  No results found for: APTT  PTT Heparin:  No components found for: APTTHEP  Magnesium:    Lab Results   Component Value Date/Time     MG 2.1 09/04/2019 05:07 PM     TSH:    Lab Results   Component Value Date/Time    TSH 1.940 06/23/2022 10:35 AM     TROPONIN:  No components found for: TROP  BNP:  No results found for: BNP  FASTING LIPID PANEL:    Lab Results   Component Value Date/Time    CHOL 177 09/05/2019 05:44 AM    HDL 34 09/05/2019 05:44 AM    TRIG 182 09/05/2019 05:44 AM     No orders to display     I have personally reviewed the laboratory, cardiac diagnostic and radiographic testing as outlined above:      IMPRESSION:  1: Chronic diastolic congestive heart failure: Compensated, continue current treatment  2: Paroxysmal atrial fibrillation: S/p ablation, last one was a year ago so, had Watchman procedure in June 2023, will continue current treatment  3: Hypertension: Controlled, continue current treatment.            4: Family history of ischemic heart disease and other diseases of the circulatory system :   Father died of massive heart attack at age 10          RECOMMENDATIONS:   1.  Continue current treatment  2.  Follow-up with Dr. Chestine Spore as scheduled  3.  Follow-up with Dr. Arnette Norris in 1 year, sooner if symptomatic for any reason    I have reviewed my findings and recommendations with patient    Electronically signed by [name], MD on 08/01/2022 at 1:39 PM    NOTE: This report was transcribed using voice recognition software. Every effort was made to ensure accuracy; however, inadvertent computerized transcription errors may be present  Please see scanned note in epic

## 2022-09-04 ENCOUNTER — Encounter: Payer: MEDICARE | Attending: Internal Medicine | Primary: Internal Medicine

## 2022-09-04 ENCOUNTER — Ambulatory Visit: Admit: 2022-09-04 | Discharge: 2022-09-04 | Payer: MEDICARE | Attending: Internal Medicine | Primary: Internal Medicine

## 2022-09-04 DIAGNOSIS — I10 Essential (primary) hypertension: Secondary | ICD-10-CM

## 2022-09-04 MED ORDER — PREDNISONE 5 MG PO TABS
5 MG | ORAL_TABLET | Freq: Two times a day (BID) | ORAL | 1 refills | Status: AC
Start: 2022-09-04 — End: 2023-08-30

## 2022-09-04 MED ORDER — FUROSEMIDE 40 MG PO TABS
40 MG | ORAL_TABLET | Freq: Every day | ORAL | 1 refills | Status: DC
Start: 2022-09-04 — End: 2023-05-01

## 2022-09-04 MED ORDER — POTASSIUM CHLORIDE ER 10 MEQ PO TBCR
10 MEQ | ORAL_TABLET | ORAL | 1 refills | Status: DC
Start: 2022-09-04 — End: 2023-05-15

## 2022-09-04 MED ORDER — LOSARTAN POTASSIUM 100 MG PO TABS
100 MG | ORAL_TABLET | Freq: Every day | ORAL | 1 refills | Status: AC
Start: 2022-09-04 — End: 2023-05-15

## 2022-09-04 MED ORDER — TADALAFIL 20 MG PO TABS
20 MG | ORAL_TABLET | Freq: Every day | ORAL | 1 refills | Status: AC | PRN
Start: 2022-09-04 — End: 2022-11-03

## 2022-09-04 MED ORDER — ATORVASTATIN CALCIUM 20 MG PO TABS
20 MG | ORAL_TABLET | Freq: Every evening | ORAL | 1 refills | Status: DC
Start: 2022-09-04 — End: 2023-05-15

## 2022-09-04 MED ORDER — OMEPRAZOLE 20 MG PO CPDR
20 MG | ORAL_CAPSULE | Freq: Every day | ORAL | 1 refills | Status: DC
Start: 2022-09-04 — End: 2023-05-15

## 2022-09-04 NOTE — Patient Instructions (Signed)
9 Ways to Cut Back on Drinking  Maybe you've found yourself drinking more alcohol than you'd prefer. If you want to cut back, here are some ideas to try.    Think before you drink.  Do you really want a drink, or is it just a habit? If you're used to having a drink at a certain time, try doing something else then.     Look for substitutes.  Find some no-alcohol drinks that you enjoy, like flavored seltzer water, tea with honey, or tonic with a slice of lime. Or try alcohol-free beer or "virgin" cocktails (without the alcohol).     Drink more water.  Use water to quench your thirst. Drink a glass of water before you have any alcohol. Have another glass along with every drink or between drinks.     Shrink your drink.  For example, have a bottle of beer instead of a pint. Use a smaller glass for wine. Choose drinks with lower alcohol content (ABV%). Or use less liquor and more mixer in cocktails.     Slow down.  It's easy to drink quickly and without thinking about it. Pay attention, and make each drink last longer.     Do the math.  Total up how much you spend on alcohol each month. How much is that a year? If you cut back, what could you do with the money you save?     Take a break.  Choose a day or two each week when you won't drink at all. Notice how you feel on those days, physically and emotionally. How did you sleep? Do you feel better? Over time, add more break days.     Count calories.  Would you like to lose some weight? That can be a good motivator for cutting back. Figure out how many calories are in each drink. How many does that add up to in a day? In a week? In a month?     Practice saying no.  Be ready when someone offers you a drink. Try: "Thanks, I've had enough." Or "Thanks, but I'm cutting back." Or "No, thanks. I feel better when I drink less."   Current as of: March 21, 2023Content Version: 13.8   2006-2023 [employer].   Care instructions adapted under license by  [facility]. If you have questions about a medical condition or this instruction, always ask your healthcare professional. [place] any warranty or liability for your use of this information.         Learning About Being Active as an Older Adult  Why is being active important as you get older?     Being active is one of the best things you can do for your health. And it's never too late to start. Being active--or getting active, if you aren't already--has definite benefits. It can:  Give you more energy,  Keep your mind sharp.  Improve balance to reduce your risk of falls.  Help you manage chronic illness with fewer medicines.  No matter how old you are, how fit you are, or what health problems you have, there is a form of activity that will work for you. And the more physical activity you can do, the better your overall health will be.  What kinds of activity can help you stay healthy?  Being more active will make your daily activities easier. Physical activity includes planned exercise and things you do in daily life. There are four types of activity:  Aerobic.  Doing aerobic activity makes your heart and lungs strong.  Includes walking, dancing, and gardening.  Aim for at least 2 hours spread throughout the week.  It improves your energy and can help you sleep better.  Muscle-strengthening.  This type of activity can help maintain muscle and strengthen bones.  Includes climbing stairs, using resistance bands, and lifting or carrying heavy loads.  Aim for at least twice a week.  It can help protect the knees and other joints.  Stretching.  Stretching gives you better range of motion in joints and muscles.  Includes upper arm stretches, calf stretches, and gentle yoga.  Aim for at least twice a week, preferably after your muscles are warmed up from other activities.  It can help you function better in daily life.  Balancing.  This helps you stay coordinated and have good  posture.  Includes heel-to-toe walking, tai chi, and certain types of yoga.  Aim for at least 3 days a week.  It can reduce your risk of falling.  Even if you have a hard time meeting the recommendations, it's better to be more active than less active. All activity done in each category counts toward your weekly total. You'd be surprised how daily things like carrying groceries, keeping up with grandchildren, and taking the stairs can add up.  What keeps you from being active?  If you've had a hard time being more active, you're not alone. Maybe you remember being able to do more. Or maybe you've never thought of yourself as being active. It's frustrating when you can't do the things you want. Being more active can help. What's holding you back?  Getting started.  Have a goal, but break it into easy tasks. Small steps build into big accomplishments.  Staying motivated.  If you feel like skipping your activity, remember your goal. Maybe you want to move better and stay independent. Every activity gets you one step closer.  Not feeling your best.  Start with 5 minutes of an activity you enjoy. Prove to yourself you can do it. As you get comfortable, increase your time.  You may not be where you want to be. But you're in the process of getting there. Everyone starts somewhere.  How can you find safe ways to stay active?  Talk with your doctor about any physical challenges you're facing. Make a plan with your doctor if you have a health problem or aren't sure how to get started with activity.  If you're already active, ask your doctor if there is anything you should change to stay safe as your body and health change.  If you tend to feel dizzy after you take medicine, avoid activity at that time. Try being active before you take your medicine. This will reduce your risk of falls.  If you plan to be active at home, make sure to clear your space before you get started. Remove things like TV cords, coffee tables, and throw  rugs. It's safest to have plenty of space to move freely.  The key to getting more active is to take it slow and steady. Try to improve only a little bit at a time. Pick just one area to improve on at first. And if an activity hurts, stop and talk to your doctor.  Where can you learn more?  Go to https://www.bennett.info/ and enter P600 to learn more about "Learning About Being Active as an Older Adult."  Current as of: June 6, 2023Content Version: 13.8  2006-2023 Healthwise, Incorporated.   Care instructions adapted under license by [facility]. If you have questions about a medical condition or this instruction, always ask your healthcare professional. [place] any warranty or liability for your use of this information.           Hearing Loss: Care Instructions  Overview     Hearing loss is a sudden or slow decrease in how well you hear. It can range from slight to profound. Permanent hearing loss can occur with aging. It also can happen when you are exposed long-term to loud noise. Examples include listening to loud music, riding motorcycles, or being around other loud machines.  Hearing loss can affect your work and home life. It can make you feel lonely or depressed. You may feel that you have lost your independence. But hearing aids and other devices can help you hear better and feel connected to others.  Follow-up care is a key part of your treatment and safety. Be sure to make and go to all appointments, and call your doctor if you are having problems. It's also a good idea to know your test results and keep a list of the medicines you take.  How can you care for yourself at home?  Avoid loud noises whenever possible. This helps keep your hearing from getting worse.  Always wear hearing protection around loud noises.  Wear a hearing aid as directed.  A professional can help you pick a hearing aid that will work best for you.  You can also get hearing  aids over the counter for mild to moderate hearing loss.  Have hearing tests as your doctor suggests. They can show whether your hearing has changed. Your hearing aid may need to be adjusted.  Use other devices as needed. These may include:  Telephone amplifiers and hearing aids that can connect to a television, stereo, radio, or microphone.  Devices that use lights or vibrations. These alert you to the doorbell, a ringing telephone, or a baby monitor.  Television closed-captioning. This shows the words at the bottom of the screen. Most new TVs can do this.  TTY (text telephone). This lets you type messages back and forth on the telephone instead of talking or listening. These devices are also called TDD. When messages are typed on the keyboard, they are sent over the phone line to a receiving TTY. The message is shown on a monitor.  Use text messaging, social media, and email if it is hard for you to communicate by telephone.  Try to learn a listening technique called speechreading. It is not lipreading. You pay attention to people's gestures, expressions, posture, and tone of voice. These clues can help you understand what a person is saying. Face the person you are talking to, and have them face you. Make sure the lighting is good. You need to see the other person's face clearly.  Think about counseling if you need help to adjust to your hearing loss.  When should you call for help?  Watch closely for changes in your health, and be sure to contact your doctor if:   You think your hearing is getting worse.    You have new symptoms, such as dizziness or nausea.   Where can you learn more?  Go to https://www.bennett.info/ and enter R798 to learn more about "Hearing Loss: Care Instructions."  Current as of: February 28, 2023Content Version: 13.8   2006-2023 Healthwise, Incorporated.   Care instructions adapted under license by  [employer]. If you have questions about a medical condition  or this instruction, always ask your healthcare professional. [place] any warranty or liability for your use of this information.           Starting a Weight Loss Plan: Care Instructions  Overview     If you're thinking about losing weight, it can be hard to know where to start. Your doctor can help you set up a weight loss plan that best meets your needs. You may want to take a class on nutrition or exercise, or you could join a weight loss support group. If you have questions about how to make changes to your eating or exercise habits, ask your doctor about seeing a registered dietitian or an exercise specialist.  It can be a big challenge to lose weight. But you don't have to make huge changes at once. Make small changes, and stick with them. When those changes become habit, add a few more changes.  If you don't think you're ready to make changes right now, try to pick a date in the future. Make an appointment to see your doctor to discuss whether the time is right for you to start a plan.  Follow-up care is a key part of your treatment and safety. Be sure to make and go to all appointments, and call your doctor if you are having problems. It's also a good idea to know your test results and keep a list of the medicines you take.  How can you care for yourself at home?  Set realistic goals. Many people expect to lose much more weight than is likely. A weight loss of 5% to 10% of your body weight may be enough to improve your health.  Get family and friends involved to provide support. Talk to them about why you are trying to lose weight, and ask them to help. They can help by participating in exercise and having meals with you, even if they may be eating something different.  Find what works best for you. If you do not have time or do not like to cook, a program that offers meal replacement bars or shakes may be better for you. Or if you like to prepare meals, finding a plan that  includes daily menus and recipes may be best.  Ask your doctor about other health professionals who can help you achieve your weight loss goals.  A dietitian can help you make healthy changes in your diet.  An exercise specialist or personal trainer can help you develop a safe and effective exercise program.  A counselor or psychiatrist can help you cope with issues such as depression, anxiety, or family problems that can make it hard to focus on weight loss.  Consider joining a support group for people who are trying to lose weight. Your doctor can suggest groups in your area.  Where can you learn more?  Go to https://www.bennett.info/ and enter U357 to learn more about "Starting a Weight Loss Plan: Care Instructions."  Current as of: February 28, 2023Content Version: 13.8   2006-2023 Healthwise, Incorporated.   Care instructions adapted under license by [facility]. If you have questions about a medical condition or this instruction, always ask your healthcare professional. [place] any warranty or liability for your use of this information.           A Healthy Heart: Care Instructions  Your Care Instructions     Coronary artery disease, also called  heart disease, occurs when a substance called plaque builds up in the vessels that supply oxygen-rich blood to your heart muscle. This can narrow the blood vessels and reduce blood flow. A heart attack happens when blood flow is completely blocked. A high-fat diet, smoking, and other factors increase the risk of heart disease.  Your doctor has found that you have a chance of having heart disease. You can do lots of things to keep your heart healthy. It may not be easy, but you can change your diet, exercise more, and quit smoking. These steps really work to lower your chance of heart disease.  Follow-up care is a key part of your treatment and safety. Be sure to make and go to all appointments, and call your doctor  if you are having problems. It's also a good idea to know your test results and keep a list of the medicines you take.  How can you care for yourself at home?  Diet   Use less salt when you cook and eat. This helps lower your blood pressure. Taste food before salting. Add only a little salt when you think you need it. With time, your taste buds will adjust to less salt.    Eat fewer snack items, fast foods, canned soups, and other high-salt, high-fat, processed foods.    Read food labels and try to avoid saturated and trans fats. They increase your risk of heart disease by raising cholesterol levels.    Limit the amount of solid fat-butter, margarine, and shortening-you eat. Use olive, peanut, or canola oil when you cook. Bake, broil, and steam foods instead of frying them.    Eat a variety of fruit and vegetables every day. Dark green, deep orange, red, or yellow fruits and vegetables are especially good for you. Examples include spinach, carrots, peaches, and berries.    Foods high in fiber can reduce your cholesterol and provide important vitamins and minerals. High-fiber foods include whole-grain cereals and breads, oatmeal, beans, brown rice, citrus fruits, and apples.    Eat lean proteins. Heart-healthy proteins include seafood, lean meats and poultry, eggs, beans, peas, nuts, seeds, and soy products.    Limit drinks and foods with added sugar. These include candy, desserts, and soda pop.   Lifestyle changes   If your doctor recommends it, get more exercise. Walking is a good choice. Bit by bit, increase the amount you walk every day. Try for at least 30 minutes on most days of the week. You also may want to swim, bike, or do other activities.    Do not smoke. If you need help quitting, talk to your doctor about stop-smoking programs and medicines. These can increase your chances of quitting for good. Quitting smoking may be the most important step you can take to protect your heart. It is never too  late to quit.    Limit alcohol to 2 drinks a day for men and 1 drink a day for women. Too much alcohol can cause health problems.    Manage other health problems such as diabetes, high blood pressure, and high cholesterol. If you think you may have a problem with alcohol or drug use, talk to your doctor.   Medicines   Take your medicines exactly as prescribed. Call your doctor if you think you are having a problem with your medicine.    If your doctor recommends aspirin, take the amount directed each day. Make sure you take aspirin and not another kind of pain reliever,  such as acetaminophen (Tylenol).   When should you call for help?   Call 911 if you have symptoms of a heart attack. These may include:   Chest pain or pressure, or a strange feeling in the chest.    Sweating.    Shortness of breath.    Pain, pressure, or a strange feeling in the back, neck, jaw, or upper belly or in one or both shoulders or arms.    Lightheadedness or sudden weakness.    A fast or irregular heartbeat.   After you call 911, the operator may tell you to chew 1 adult-strength or 2 to 4 low-dose aspirin. Wait for an ambulance. Do not try to drive yourself.  Watch closely for changes in your health, and be sure to contact your doctor if you have any problems.  Where can you learn more?  Go to https://www.bennett.info/ and enter F075 to learn more about "A Healthy Heart: Care Instructions."  Current as of: June 25, 2023Content Version: 13.8   2006-2023 Healthwise, Incorporated.   Care instructions adapted under license by [facility]. If you have questions about a medical condition or this instruction, always ask your healthcare professional. [place] any warranty or liability for your use of this information.      Personalized Preventive Plan for Jeffrey [name] - 09/04/2022  Medicare offers a range of preventive health benefits. Some of the tests and screenings are paid in  full while other may be subject to a deductible, co-insurance, and/or copay.    Some of these benefits include a comprehensive review of your medical history including lifestyle, illnesses that may run in your family, and various assessments and screenings as appropriate.    After reviewing your medical record and screening and assessments performed today your provider may have ordered immunizations, labs, imaging, and/or referrals for you.  A list of these orders (if applicable) as well as your Preventive Care list are included within your After Visit Summary for your review.    Other Preventive Recommendations:    A preventive eye exam performed by an eye specialist is recommended every 1-2 years to screen for glaucoma; cataracts, macular degeneration, and other eye disorders.  A preventive dental visit is recommended every 6 months.  Try to get at least 150 minutes of exercise per week or 10,000 steps per day on a pedometer .  Order or download the FREE "Exercise & Physical Activity: Your Everyday Guide" from [employer]. Call 734-021-7530 or search [employer] online.  You need 1200-1500 mg of calcium and 1000-2000 IU of vitamin D per day. It is possible to meet your calcium requirement with diet alone, but a vitamin D supplement is usually necessary to meet this goal.  When exposed to the sun, use a sunscreen that protects against both UVA and UVB radiation with an SPF of 30 or greater. Reapply every 2 to 3 hours or after sweating, drying off with a towel, or swimming.  Always wear a seat belt when traveling in a car. Always wear a helmet when riding a bicycle or motorcycle.

## 2022-09-04 NOTE — Progress Notes (Signed)
Medicare Annual Wellness Visit    Jeffrey [name] is here for Medicare AWV    Assessment & Plan   Primary hypertension  -     losartan (COZAAR) 100 MG tablet; Take 1 tablet by mouth daily, Disp-90 tablet, R-1Normal  -     furosemide (LASIX) 40 MG tablet; Take 1 tablet by mouth daily, Disp-90 tablet, R-1Normal  -     potassium chloride (KLOR-CON 10) 10 MEQ extended release tablet; Take 1 tablet by mouth every other day, Disp-90 tablet, R-1Normal  PMR (polymyalgia rheumatica) (HCC)  -     predniSONE (DELTASONE) 5 MG tablet; Take 1 tablet by mouth 2 times daily, Disp-180 tablet, R-1Normal  Paroxysmal atrial fibrillation (HCC)  Benign prostatic hyperplasia with urinary frequency  -     tadalafil (CIALIS) 20 MG tablet; Take 1 tablet by mouth daily as needed for Erectile Dysfunction, Disp-30 tablet, R-1Normal  Hyperlipidemia, unspecified hyperlipidemia type  -     atorvastatin (LIPITOR) 20 MG tablet; Take 1 tablet by mouth nightly, Disp-90 tablet, R-1Normal  Initial Medicare annual wellness visit    Recommendations for Preventive Services Due: see orders and patient instructions/AVS.  Recommended screening schedule for the next 5-10 years is provided to the patient in written form: see Patient Instructions/AVS.     Return for Medicare Annual Wellness Visit in 1 year.     Subjective     Recently saw cardiology in August and is doing well; he is down to prednisone 5 mg BID and is doing well; he is off eliquis since having his watchman procedure     Patient's complete Health Risk Assessment and screening values have been reviewed and are found in Flowsheets. The following problems were reviewed today and where indicated follow up appointments were made and/or referrals ordered.    Positive Risk Factor Screenings with Interventions:                 Weight and Activity:  Physical Activity: Inactive (09/04/2022)    Exercise Vital Sign     Days of Exercise per Week: 0 days     Minutes of Exercise per Session: 0 min     On  average, how many days per week do you engage in moderate to strenuous exercise (like a brisk walk)?: 0 days  Have you lost any weight without trying in the past 3 months?: No  Body mass index is 32.95 kg/m. (!) Abnormal    Inactivity Interventions:  Patient declined any further interventions or treatment  Obesity Interventions:  Patient declines any further evaluation or treatment           Hearing Screen:  Do you or your family notice any trouble with your hearing that hasn't been managed with hearing aids?: (!) Yes    Interventions:  Patient declines any further evaluation or treatment                 Objective   Vitals:    09/04/22 0812   BP: 118/70   Pulse: 69   Temp: 97.7 F (36.5 C)   SpO2: 98%   Weight: 216 lb 11.2 oz (98.3 kg)   Height: 5\' 8"  (1.727 m)      Body mass index is 32.95 kg/m.        General Appearance: alert and oriented to person, place and time, well developed and well- nourished, in no acute distress  Skin: warm and dry, no rash or erythema  Head: normocephalic and atraumatic  Eyes:  conjunctivae normal  ENT: tympanic membrane, external ear and ear canal normal bilaterally, nose without deformity, nasal mucosa and turbinates normal without polyps  Neck: supple and non-tender without mass, no thyromegaly or thyroid nodules, no cervical lymphadenopathy  Pulmonary/Chest: clear to auscultation bilaterally- no wheezes, rales or rhonchi, normal air movement, no respiratory distress  Cardiovascular: normal rate, regular rhythm, normal S1 and S2, no murmurs, rubs, clicks, or gallops, distal pulses intact  Abdomen: soft, non-tender, non-distended, normal bowel sounds, no masses or organomegaly  Extremities: no cyanosis, clubbing or edema  Musculoskeletal: normal range of motion, no joint swelling, deformity or tenderness  Neurologic: no focal deficits       Allergies   Allergen Reactions    Lidocaine-Epinephrine     Cephalexin Hives and Rash    Sulfa Antibiotics Rash     Prior to Visit Medications     Medication Sig Taking? Authorizing Provider   atorvastatin (LIPITOR) 20 MG tablet Take 1 tablet by mouth nightly Yes Moss Mc, MD   losartan (COZAAR) 100 MG tablet Take 1 tablet by mouth daily Yes Moss Mc, MD   predniSONE (DELTASONE) 5 MG tablet Take 1 tablet by mouth 2 times daily Yes Moss Mc, MD   tadalafil (CIALIS) 20 MG tablet Take 1 tablet by mouth daily as needed for Erectile Dysfunction Yes Moss Mc, MD   furosemide (LASIX) 40 MG tablet Take 1 tablet by mouth daily Yes Moss Mc, MD   omeprazole (PRILOSEC) 20 MG delayed release capsule Take 1 capsule by mouth daily Yes Moss Mc, MD   potassium chloride (KLOR-CON 10) 10 MEQ extended release tablet Take 1 tablet by mouth every other day Yes Moss Mc, MD   aspirin 81 MG EC tablet Take 1 tablet by mouth daily Yes Historical Provider, MD   metoprolol tartrate (LOPRESSOR) 25 MG tablet Take 1 tablet by mouth 2 times daily Yes Moss Mc, MD   tiZANidine (ZANAFLEX) 2 MG tablet Take 1 tablet by mouth as needed Yes Historical Provider, MD   hydrOXYzine HCl (ATARAX) 25 MG tablet Take 1 tablet by mouth as needed for Itching Yes Historical Provider, MD       CareTeam (Including outside providers/suppliers regularly involved in providing care):   Patient Care Team:  Moss Mc, MD as PCP - General (Internal Medicine)  Moss Mc, MD as PCP - Empaneled Provider  [name], MD as Consulting Physician (Cardiology)     Reviewed and updated this visit:  Tobacco  Allergies  Meds  Problems  Med Hx  Surg Hx  Soc Hx  Fam Hx

## 2022-09-04 NOTE — Progress Notes (Deleted)
Jeffrey [name] (DOB:  [birth year]) is a 76 y.o. male,Established patient, here for evaluation of the following chief complaint(s):  No chief complaint on file.         ASSESSMENT/PLAN:  {There are no diagnoses linked to this encounter. (Refresh or delete this SmartLink)}    No follow-ups on file.         Subjective   SUBJECTIVE/OBJECTIVE:  HPI    Patient is a 76 y/o M with a PMHx of afib, CHF, HTN and PMR who presents for follow up.    PMR- on prednisone    Afib/CHF- follows with cardiology- on asa, metoprolol, statin; saw Dr Criselda Peaches in August     HTN- on losartan, lasix    Review of Systems       Objective   Physical Exam       {Time Documentation Optional:[id number]}      An electronic signature was used to authenticate this note.    --Moss Mc, MD

## 2022-09-11 ENCOUNTER — Encounter

## 2022-09-11 MED ORDER — TADALAFIL 20 MG PO TABS
20 MG | ORAL_TABLET | Freq: Every day | ORAL | 3 refills | Status: AC | PRN
Start: 2022-09-11 — End: 2023-09-06

## 2022-09-11 NOTE — Telephone Encounter (Signed)
Patient requested the tadalafil to be a 90 day supplies and 3 refills; [place]

## 2023-05-01 ENCOUNTER — Ambulatory Visit
Admit: 2023-05-01 | Discharge: 2023-05-01 | Payer: MEDICARE | Attending: Orthopaedic Surgery | Primary: Internal Medicine

## 2023-05-01 DIAGNOSIS — G5601 Carpal tunnel syndrome, right upper limb: Secondary | ICD-10-CM

## 2023-05-01 NOTE — Progress Notes (Signed)
Chief Complaint   Patient presents with    Carpal Tunnel     B/L CTS numbness, tingling , weakness.       Jeffrey [name] is a 77 y.o. year old Caucasian male who presents for evaluation of bilateral wrist pain L>R.  he reports this started 1 year ago.  he does remember a specific injury that started the pain.   The injury was repetitive injury. The pain is located mainly palm and fingers .  The pain is worse with activity and better with rest.  The patient has tried OTC analgesics, ice, avoidance of offending activity, wrist splint which is Yes:  , which has been ineffective..  The treatment has not been effective.  The patient is right dominant.  The patient is employed at part time [employer].    Past Medical History:   Diagnosis Date    Arrhythmia     Hyperlipidemia     Hypertension      Past Surgical History:   Procedure Laterality Date    APPENDECTOMY      HERNIA REPAIR         Current Outpatient Medications:     tadalafil (CIALIS) 20 MG tablet, Take 1 tablet by mouth daily as needed for Erectile Dysfunction, Disp: 90 tablet, Rfl: 3    atorvastatin (LIPITOR) 20 MG tablet, Take 1 tablet by mouth nightly, Disp: 90 tablet, Rfl: 1    losartan (COZAAR) 100 MG tablet, Take 1 tablet by mouth daily, Disp: 90 tablet, Rfl: 1    predniSONE (DELTASONE) 5 MG tablet, Take 1 tablet by mouth 2 times daily, Disp: 180 tablet, Rfl: 1    omeprazole (PRILOSEC) 20 MG delayed release capsule, Take 1 capsule by mouth daily, Disp: 90 capsule, Rfl: 1    potassium chloride (KLOR-CON 10) 10 MEQ extended release tablet, Take 1 tablet by mouth every other day, Disp: 90 tablet, Rfl: 1    aspirin 81 MG EC tablet, Take 1 tablet by mouth daily, Disp: , Rfl:     hydrOXYzine HCl (ATARAX) 25 MG tablet, Take 1 tablet by mouth as needed for Itching, Disp: , Rfl:   Allergies   Allergen Reactions    Lidocaine-Epinephrine     Cephalexin Hives and Rash    Sulfa Antibiotics Rash     Social History     Socioeconomic History    Marital status: Married      Spouse name: Not on file    Number of children: 1    Years of education: Not on file    Highest education level: Not on file   Occupational History    Not on file   Tobacco Use    Smoking status: Never    Smokeless tobacco: Never   Vaping Use    Vaping Use: Never used   Substance and Sexual Activity    Alcohol use: Yes     Alcohol/week: 2.0 standard drinks of alcohol     Types: 2 Shots of liquor per week     Comment: 2 drinks vodka a day     Drug use: Never    Sexual activity: Yes   Other Topics Concern    Not on file   Social History Narrative    Not on file     Social Determinants of Health     Financial Resource Strain: Low Risk  (06/01/2022)    Overall Financial Resource Strain (CARDIA)     Difficulty of Paying Living Expenses: Not hard at all  Food Insecurity: Not on file (06/01/2022)   Transportation Needs: Unknown (06/01/2022)    PRAPARE - Therapist, art (Medical): Not on file     Lack of Transportation (Non-Medical): No   Physical Activity: Inactive (04/30/2023)    Exercise Vital Sign     Days of Exercise per Week: 0 days     Minutes of Exercise per Session: 0 min   Stress: Not on file   Social Connections: Not on file   Intimate Partner Violence: Not on file   Housing Stability: Unknown (06/01/2022)    Housing Stability Vital Sign     Unable to Pay for Housing in the Last Year: Not on file     Number of Places Lived in the Last Year: Not on file     Unstable Housing in the Last Year: No     Family History   Problem Relation Age of Onset    Heart Attack Father        REVIEW OF SYSTEMS:     General/Constitution:  (-)weight loss, (-)fever, (-)chills, (-)weakness.   Skin: (-) rash,(-) psoriasis,(-) eczema, (-)skin cancer.   Musculoskeletal: (-) fractures,  (-) dislocations,(-) collagen vascular disease, (-) fibromyalgia, (-) multiple sclerosis, (-) muscular dystrophy, (-) RSD,(-) joint pain (-)swelling, (-) joint pain,swelling.  Neurologic: (-) epilepsy, (-)seizures,(-) brain tumor,(-) TIA,  (-)stroke, (-)headaches, (-)Parkinson disease,(-) memory loss, (-) LOC.  Cardiovascular: (-) Chest pain, (-) swelling in legs/feet, (-) SOB, (-) cramping in legs/feet with walking.  Respiratory: (-) SOB, (-) Coughing, (-) night sweats.  GI: (-) nausea, (-) vomiting, (-) diarrhea, (-) blood in stool, (-) gastric ulcer.  Psychiatric: (-) Depression, (-) Anxiety, (-) bipolar disease, (-) Alzheimer's Disease  Allergic/Immunologic: (-) allergies latex, (-) allergies metal, (-) skin sensitivity.  Hematlogic: (-) anemia, (-) blood transfusion, (-) DVT/PE, (-) Clotting disorders      Subjective:    Constitution:  Temp 98 F (36.7 C)   Ht 1.727 m (5' 7.99")   Wt 99.8 kg (220 lb)   BMI 33.46 kg/m     Psycihatric:  The patient is alert and oriented x 3, appears to be stated age and in no distress.      Respiratory:  Respiratory effort is not labored.  Patient is not gasping.  Palpation of the chest reveals no tactile fremitus.    Skin:  Upon inspection: the skin appears warm, dry and intact.  There is not a previous scar over the affected area.There is not any cellulitis, lymphedema or cutaneous lesions noted in the lower extremities.   Upon palpation there is no induration noted.      Neurologic:  Motor exam of the upper extremities show: The reflexes in biceps/triceps/brachioradialis are equal and symmetric.  Sensory exam C5-T1 are normal bilaterally, except median n distribution b/l hands.        Cardiovascular:  The vascular exam is normal and is well perfused to distal extremities.There are 2+ radial pulses bilaterally, and motor and sensation is intact to median, ulnar, and radial, musclocutaneus, and axillary nerve distribution and grossly symmetric bilaterally.  There is cap refill noted less than two seconds in all digits. There is not edema of the bilateral upper extremities.  There is not varicosities noted in the distal extremities.      Lymph:  Upon palpation,  there is no lymphadenopathy noted in bilateral  upper extremities.      Musculoskeletal:  Gait: normal; examination of the nails and digits reveal no cyanosis or  clubbing.    Cervical Exam:  On physical exam, Jeffrey [name] is well-developed, well-nourished, oriented to person, place and time.  his gait is normal.  On evaluation of his cervical spine, he has full range of motion of the cervical spine without pain.  There is no cervical tenderness to palpation.     Shoulder Exam:  On evaluation of his bilaterally upper extremities, his bilateral shoulder has no deformity.     There is not evidence of scapular dyskinesis.  There is not muscle atrophy in shoulder girdle. The range of motion for the Right Shoulder is 160/45/T8 and for the Left shoulder is 160/45/T8.Right shoulder Motor strength is 5/5 in the supraspinatus, 5/5 internal rotation and 5/5 in external rotation, and Left shoulder motor strength 5/5 in supraspinatus, 5/5 in internal rotation, 5/5 in external rotation.      Elbow exam:  Evaluation of the elbow, reveals no signs of swelling or deformity. ROM is 150-0.  There is not instability with varus/valgus stresses.  Motor strength is 5/5 with flexion/extension.     Wrist exam:  Inspection of the bilateral upper extremities, there is no evidence of deformity of the wrist.  ROM Wrist ROM R wrist DF 90, VF 90, L wrist DF 90, VF 90, R pronation 90/ supination 90, L pronation 90/supination 90.  Motor strength is 5/5 with Dorsiflexion/Volarflexion/Supination/Pronation.  Motor and sensation is intact and symmetric throughout the bilateral upper extremities in the median, ulnar and radial , musclcutaneous, and axillary nerve distributions.    Hand exam:  The skin overlying the hand is  intact.  There is not evidence of scar, lesion, laceration, or abrasion.  The motion in the small joints of the hand are intact with no stiffness or deformity.  The ROM in the MCP flexion wnl/ extension wnl , PIP flexion wnl/ extension wnl, DIP flexion wnl/ extension wnl.  There  is not rotational deformity.    There is no masses or adenopathy in bilateral upper extremities.  Radial pulses are 2+ and symmetric bilaterally.  Capillary refill is intact and < 2 seconds.  Motor strength is 5/5 with flexion and extension of the small finger joints.     Right:  Phallens sign(+), Tinnells sign (+), Median nerve compression test (+),  Finklesteins (-), [facility] test (-), [facility](-).   Left:    Phallens sign(+), Tinnells sign (+), Median nerve compression test (+),  Finklesteins (-), [facility] Gr[facility] test (-), [facility](-).     Xrays: None    Radiographic findings reviewed with patient    Impression:   Encounter Diagnoses   Name Primary?    Right carpal tunnel syndrome Yes    Left carpal tunnel syndrome        Plan: Natural history and expected course discussed. Questions answered.  Transport planner distributed.  Rest, ice, compression, and elevation (RICE) therapy.  Reduction in offending activity discussed.  OTC analgesics as needed.     EMG B/l UE

## 2023-05-04 ENCOUNTER — Ambulatory Visit: Admit: 2023-05-04 | Discharge: 2023-05-04 | Payer: MEDICARE | Attending: Physical Medicine & Rehabilitation

## 2023-05-04 DIAGNOSIS — G5601 Carpal tunnel syndrome, right upper limb: Secondary | ICD-10-CM

## 2023-05-04 NOTE — Progress Notes (Unsigned)
[facility]  Electrodiagnostic Laboratory  *Accredited by the AANEM with exemplary status  [address]  [place], [place] [address]  Phone: [phone]  Fax: [phone]    Referring Provider: Girtha Hake, DO  Primary Care Physician: No primary care provider on file.  Patient Name: Jeffrey [name]  Patient birth date: 07/26/1946  Gender: male  BMI: Body mass index is 33.75 kg/m.  Height 1.727 m (5\' 8" ), weight 100.7 kg (222 lb).    05/04/2023    Reason for Referral: Carpal tunnel    Description of clinical problem:   Chief Complaint   Patient presents with    Extremity Pain     Pain in the hands and fingers, not the pinky. 1 year of symp and getting worse. Left is worse than the right.     Numbness     Numbness/tingling in the first four fingers.     Extremity Weakness     None       NCS      Nerve / Sites Rec. Site Peak Lat PP Amp Segments Distance Velocity Temp.     ms V  cm m/s C   L Median - Digit II (Antidromic)      Palm Dig II 2.81 6.7 Palm - Dig II 7 36 32.7      Wrist Dig II 6.56* 2.1 Wrist - Dig II 14 25 32.7   R Median - Digit II (Antidromic)      Palm Dig II 2.71 6.3 Palm - Dig II 7 29 32.9      Wrist Dig II 5.31* 5.2 Wrist - Dig II 14 34 32.9   L Ulnar - Digit V (Antidromic)      Wrist Dig V 3.75 6.7 Wrist - Dig V 14 54 32.7   L Radial - Anatomical snuff box (Forearm)      Forearm Wrist 2.55 12.5 Forearm - Wrist 10 55 32.8       Motor NCS      Nerve / Sites Muscle Onset Amplitude Segments Distance Velocity Temp.     ms mV  cm m/s C   L Median - APB      Palm APB 2.14 7.7 Palm - APB   32.8      Wrist APB 7.14* 3.8* Wrist - Palm 8 16* 32.8      Elbow APB 11.20 2.1* Elbow - Wrist 21 52 32.6   R Median - APB      Palm APB 2.66 7.6 Palm - APB   32.8      Wrist APB 5.42* 7.1 Wrist - Palm 8 29* 32.8      Elbow APB 10.31 7.2 Elbow - Wrist 21 43* 32.7   L Ulnar - ADM      Wrist ADM 3.44 13.0 Wrist - ADM 8  32.8      B.Elbow ADM 8.13 9.9 B.Elbow - Wrist 22 47 32.8      A.Elbow  ADM 10.21 9.9 A.Elbow - B.Elbow 10 48 32.7       F Wave      Nerve Fmin % F    ms %   L Median - APB 35.26* 80   L Ulnar - ADM 28.33 60   R Median - APB 34.27* 40       EMG      EMG Summary Table     Spontaneous MUAP Recruitment   Muscle Nerve Roots IA Fib PSW Fasc Amp  Dur. PPP Pattern   L. Biceps brachii Musculocutaneous C5-C6 N None None None N N N N   L. Triceps brachii Radial C6-C8 N None None None N N N N   L. Pronator teres Median C6-C7 N None None None N N N N   L. First dorsal interosseous Ulnar C8-T1 N None None None N N N N   L. Abductor pollicis brevis Median C8-T1 N 1+ 1+ None N N N Reduced   R. Biceps brachii Musculocutaneous C5-C6 N None None None N N N N   R. Triceps brachii Radial C6-C8 N None None None N N N N   R. Pronator teres Median C6-C7 N None None None N N N N   R. First dorsal interosseous Ulnar C8-T1 N None None None N N N N   R. Abductor pollicis brevis Median C8-T1 N None None None N N N N     Study Limitations:  ***    Summary of Findings:   Nerve conduction studies:   The following nerve conduction studies were abnormal:   Prolonged peak latency at the left and right wrist  Onset is slow at the left and right wrist   Decrease in amplitude across the left wrist and elbow  Conduction velocity is slow at the left and right wrist  F wave is prolonged at the median motor  All other nerve conduction studies, as listed in the table were normal in latency, amplitude and conduction velocity.     Needle EMG:   Needle EMG was performed using a concentric  needle.  The following abnormalities were seen on needle EMG: ***  Otherwise, observed motor units were normal in amplitude, duration, phases and recruitment and no active denervation signs were seen.        Diagnostic Interpretation:   This study was {Normal/Abnormal:[id number]}.     Electrodiagnosis: There is electrodiagnostic evidence of a***.   Location: {RIGHT/LEFT/BIL:20308} {SYMMETRIC/ASYMMETRIC:304500250}; {Desc; prox/mid/distal:11122},  at the ***.   Nature: [  ] Axonal   [  ] Demyelinating  [  ] Mixed axonal and demyelinating     [  ] Sensory [  ] Motor               [  ] Mixed sensorimotor     [  ] with active denervation       [  ] without active denervation  Duration: {Desc; acute/subacute/chronic:13799}  Severity: {MILD/MODERATE/SEVERE:21011}  Prognosis: {Prognosis:20761}    Previous Study: There is not a prior study for comparison. Date: ***. Provider: ***. Compared to prior study, today's examination shows ***.    Follow up EMG is recommended if ***.     Technologist: Kelby Aline  Physician:***    Nerve conduction studies and electromyography were performed according to our laboratory policies and procedures which can be provided upon request. All abnormal values are identified in the table. Laboratory normal values can also be provided upon request.       Cc: Girtha Hake, DO  No primary care provider on file.

## 2023-05-04 NOTE — Patient Instructions (Signed)
Electrodiagnotic Laboratory  Accredited by the AANEM with Exemplary status  Janeen Masternick-Black, D.O  Stephanie Kopey, D.O.   [facility]  [address] [place] [address]  Phone: [phone]  Fax: [phone]        Today you had an electrodiagnostic exam which included nerve conduction studies (NCS) and electromyography (EMG). This test evaluated the electrical activity of your nerves and muscles to help determine if you have a nerve or muscle disease.  This test can help determine the location and type of a nerve or muscle problem. This will help your referring doctor diagnose your condition and determine the appropriate next step in your treatment plan.     After your test:    1. There are no long lasting side effects of the test.     2. You may resume your normal activities without restrictions.     3.  Resume any medications that were stopped for the test.     4  If you have sore areas or bruising in your muscles where the needle was placed, apply a cold pack to the sore area for 15-20 minutes three to four times a day as needed for pain.  The soreness should go away in about 1-2 days.     5. Your results were provided  Briefly at the end of your test and the final detailed report will be provided to your referring physician, and/or primary care physician and any other parties you requested within 1-2 days of the examination. You may wish to contact your referring provider after a few days to determine what they would like you to do next.     6.  Please call [phone] with any questions or concerns and if you develop increased body temperature/fever, swelling, tenderness, increased pain and/or drainage from the sites where the needle was placed.     Thank you for choosing us for your health care needs.

## 2023-05-04 NOTE — Progress Notes (Unsigned)
Electrodiagnostic Laboratory  *Accredited by the AANEM with exemplary status  [address]  [place], [place] [address]  Phone: [phone]  Fax: [phone]      Date of Examination: 05/04/23    Patient Name: Jeffrey [name]    An independent historian was not needed.     Jeffrey [name]  is a 77 y.o. year old male who was seen today regarding   Chief Complaint   Patient presents with    Extremity Pain     Pain in the hands and fingers, not the pinky. 1 year of symp and getting worse. Left is worse than the right.     Numbness     Numbness/tingling in the first four fingers.     Extremity Weakness     None   .        on the visual analog scale where 0 is no pain and 10 is the worst pain possible.   The symptoms are {Desc; intermittent/persistent/constant:12478}.       I have reviewed the referring provider's office note.    There is not a family history of neuromuscular conditions.     Physical Exam: General: The patient is in no apparent distress.  MSK: There is no joint effusion, deformity, instability, swelling, erythema or warmth.  AROM is full in the spine and extremities. ***. Neurologic:  No focal sensorimotor deficit.  Reflexes 2+ and symmetric. Gait is normal.    Impression:     1. Right carpal tunnel syndrome    2. Left carpal tunnel syndrome        Plan:   EMG is indicated to evaluate the above diagnosis.    No orders of the defined types were placed in this encounter.    EMG was done today and showed ***.     The patient was educated about the diagnosis and the prognosis.   Advised patient to follow up with referring provider.       Thank you for allowing me to participate in the care of your patient.      Sincerely,       Rivka Barbara, D.O., P.T.  Board Certified Physical Medicine and Rehabilitation  Board Certified Electrodiagnostic Medicine

## 2023-05-10 ENCOUNTER — Encounter
Admit: 2023-05-10 | Discharge: 2023-05-10 | Payer: MEDICARE | Attending: Orthopaedic Surgery | Primary: Internal Medicine

## 2023-05-10 DIAGNOSIS — G5601 Carpal tunnel syndrome, right upper limb: Secondary | ICD-10-CM

## 2023-05-10 NOTE — Progress Notes (Unsigned)
Chief Complaint   Patient presents with    Hand Pain     Bilateral Hand, F/U, had EMG done on 05/04/2023 with  Dr.Kopey       Jeffrey [name] is a 77 y.o. year old Caucasian male who presents for follow up of bilateral upper extremity EMG.    Past Medical History:   Diagnosis Date    Arrhythmia     Hyperlipidemia     Hypertension      Past Surgical History:   Procedure Laterality Date    APPENDECTOMY      HERNIA REPAIR         Current Outpatient Medications:     tadalafil (CIALIS) 20 MG tablet, Take 1 tablet by mouth daily as needed for Erectile Dysfunction, Disp: 90 tablet, Rfl: 3    atorvastatin (LIPITOR) 20 MG tablet, Take 1 tablet by mouth nightly, Disp: 90 tablet, Rfl: 1    losartan (COZAAR) 100 MG tablet, Take 1 tablet by mouth daily, Disp: 90 tablet, Rfl: 1    predniSONE (DELTASONE) 5 MG tablet, Take 1 tablet by mouth 2 times daily, Disp: 180 tablet, Rfl: 1    omeprazole (PRILOSEC) 20 MG delayed release capsule, Take 1 capsule by mouth daily, Disp: 90 capsule, Rfl: 1    potassium chloride (KLOR-CON 10) 10 MEQ extended release tablet, Take 1 tablet by mouth every other day, Disp: 90 tablet, Rfl: 1    aspirin 81 MG EC tablet, Take 1 tablet by mouth daily, Disp: , Rfl:     hydrOXYzine HCl (ATARAX) 25 MG tablet, Take 1 tablet by mouth as needed for Itching, Disp: , Rfl:   Allergies   Allergen Reactions    Lidocaine-Epinephrine     Cephalexin Hives and Rash    Sulfa Antibiotics Rash     Social History     Socioeconomic History    Marital status: Married     Spouse name: Not on file    Number of children: 1    Years of education: Not on file    Highest education level: Not on file   Occupational History    Not on file   Tobacco Use    Smoking status: Never    Smokeless tobacco: Never   Vaping Use    Vaping Use: Never used   Substance and Sexual Activity    Alcohol use: Yes     Alcohol/week: 2.0 standard drinks of alcohol     Types: 2 Shots of liquor per week     Comment: 2 drinks vodka a day     Drug use: Never     Sexual activity: Yes   Other Topics Concern    Not on file   Social History Narrative    Not on file     Social Determinants of Health     Financial Resource Strain: Low Risk  (06/01/2022)    Overall Financial Resource Strain (CARDIA)     Difficulty of Paying Living Expenses: Not hard at all   Food Insecurity: Not on file (06/01/2022)   Transportation Needs: Unknown (06/01/2022)    PRAPARE - Therapist, art (Medical): Not on file     Lack of Transportation (Non-Medical): No   Physical Activity: Inactive (04/30/2023)    Exercise Vital Sign     Days of Exercise per Week: 0 days     Minutes of Exercise per Session: 0 min   Stress: Not on file   Social Connections: Not on  file   Intimate Partner Violence: Not on file   Housing Stability: Unknown (06/01/2022)    Housing Stability Vital Sign     Unable to Pay for Housing in the Last Year: Not on file     Number of Places Lived in the Last Year: Not on file     Unstable Housing in the Last Year: No     Family History   Problem Relation Age of Onset    Heart Attack Father        REVIEW OF SYSTEMS:     General/Constitution:  (-)weight loss, (-)fever, (-)chills, (-)weakness.   Skin: (-) rash,(-) psoriasis,(-) eczema, (-)skin cancer.   Musculoskeletal: (-) fractures,  (-) dislocations,(-) collagen vascular disease, (-) fibromyalgia, (-) multiple sclerosis, (-) muscular dystrophy, (-) RSD,(-) joint pain (-)swelling, (-) joint pain,swelling.  Neurologic: (-) epilepsy, (-)seizures,(-) brain tumor,(-) TIA, (-)stroke, (-)headaches, (-)Parkinson disease,(-) memory loss, (-) LOC.  Cardiovascular: (-) Chest pain, (-) swelling in legs/feet, (-) SOB, (-) cramping in legs/feet with walking.  Respiratory: (-) SOB, (-) Coughing, (-) night sweats.  GI: (-) nausea, (-) vomiting, (-) diarrhea, (-) blood in stool, (-) gastric ulcer.  Psychiatric: (-) Depression, (-) Anxiety, (-) bipolar disease, (-) Alzheimer's Disease  Allergic/Immunologic: (-) allergies latex, (-) allergies  metal, (-) skin sensitivity.  Hematlogic: (-) anemia, (-) blood transfusion, (-) DVT/PE, (-) Clotting disorders      Subjective:    Constitution:  Temp 98 F (36.7 C)   Ht 1.727 m (5\' 8" )   Wt 100.7 kg (222 lb)   BMI 33.75 kg/m     Psycihatric:  The patient is alert and oriented x 3, appears to be stated age and in no distress.      Respiratory:  Respiratory effort is not labored.  Patient is not gasping.  Palpation of the chest reveals no tactile fremitus.    Skin:  Upon inspection: the skin appears warm, dry and intact.  There is not a previous scar over the affected area.There is not any cellulitis, lymphedema or cutaneous lesions noted in the lower extremities.   Upon palpation there is no induration noted.      Neurologic:  Motor exam of the upper extremities show: The reflexes in biceps/triceps/brachioradialis are equal and symmetric.  Sensory exam C5-T1 are normal bilaterally, except median n distribution b/l hands.        Cardiovascular:  The vascular exam is normal and is well perfused to distal extremities.There are 2+ radial pulses bilaterally, and motor and sensation is intact to median, ulnar, and radial, musclocutaneus, and axillary nerve distribution and grossly symmetric bilaterally.  There is cap refill noted less than two seconds in all digits. There is not edema of the bilateral upper extremities.  There is not varicosities noted in the distal extremities.      Lymph:  Upon palpation,  there is no lymphadenopathy noted in bilateral upper extremities.      Musculoskeletal:  Gait: normal; examination of the nails and digits reveal no cyanosis or clubbing.    Cervical Exam:  On physical exam, Jeffrey [name] is well-developed, well-nourished, oriented to person, place and time.  his gait is normal.  On evaluation of his cervical spine, he has full range of motion of the cervical spine without pain.  There is no cervical tenderness to palpation.     Shoulder Exam:  On evaluation of his bilaterally  upper extremities, his bilateral shoulder has no deformity.     There is not evidence of scapular dyskinesis.  There is not muscle atrophy in shoulder girdle. The range of motion for the Right Shoulder is 160/45/T8 and for the Left shoulder is 160/45/T8.Right shoulder Motor strength is 5/5 in the supraspinatus, 5/5 internal rotation and 5/5 in external rotation, and Left shoulder motor strength 5/5 in supraspinatus, 5/5 in internal rotation, 5/5 in external rotation.      Elbow exam:  Evaluation of the elbow, reveals no signs of swelling or deformity. ROM is 150-0.  There is not instability with varus/valgus stresses.  Motor strength is 5/5 with flexion/extension.     Wrist exam:  Inspection of the bilateral upper extremities, there is no evidence of deformity of the wrist.  ROM Wrist ROM R wrist DF 90, VF 90, L wrist DF 90, VF 90, R pronation 90/ supination 90, L pronation 90/supination 90.  Motor strength is 5/5 with Dorsiflexion/Volarflexion/Supination/Pronation.  Motor and sensation is intact and symmetric throughout the bilateral upper extremities in the median, ulnar and radial , musclcutaneous, and axillary nerve distributions.    Hand exam:  The skin overlying the hand is  intact.  There is not evidence of scar, lesion, laceration, or abrasion.  The motion in the small joints of the hand are intact with no stiffness or deformity.  The ROM in the MCP flexion wnl/ extension wnl , PIP flexion wnl/ extension wnl, DIP flexion wnl/ extension wnl.  There is not rotational deformity.    There is no masses or adenopathy in bilateral upper extremities.  Radial pulses are 2+ and symmetric bilaterally.  Capillary refill is intact and < 2 seconds.  Motor strength is 5/5 with flexion and extension of the small finger joints.     Right:  Phallens sign(+), Tinnells sign (+), Median nerve compression test (+),  Finklesteins (-), [facility] Grind test (-), [facility](-).   Left:    Phallens sign(+), Tinnells sign (+), Median  nerve compression test (+),  Finklesteins (-), [facility] Grind test (-), [facility](-).     Xrays: None      EMG:    Radiographic findings reviewed with patient    Impression:   No diagnosis found.      Plan:     Left CTR 05/29/23

## 2023-05-11 ENCOUNTER — Encounter

## 2023-05-11 NOTE — Telephone Encounter (Signed)
Prior Authorization Form:      DEMOGRAPHICS:                     Patient Name:  Jeffrey [name]  Patient DOB:  04-03-1946            Insurance:  Payor: [employer] MEDICARE / Plan: [employer]HC AARP MEDICARE ADVANTAGE / Product Type: *No Product type* /   Insurance ID Number:    Payer/Plan Subscr DOB Sex Relation Sub. Ins. ID Effective Group Num   1. [employer] MEDICARE [name], [name] 1946-05-15 Male Self [id number] 12/25/22 [id number]                                   [address] [address]         DIAGNOSIS & PROCEDURE:                       Procedure/Operation: LEFT WRIST CARPAL TUNNEL RELEASE           CPT Code: 21308    Diagnosis:  LEFT WRIST CARPAL TUNNEL    ICD10 Code: G56.02     Location:  [facility]    Surgeon:  WILL WOODS    SCHEDULING INFORMATION:                          Date: 05/29/23    Time: TO FOLLOW              Anesthesia:  Bier Block                                                       Status:  Outpatient        Special Comments:  NONE       Electronically signed by Devota Pace, ATC on 05/11/2023 at 10:14 AM

## 2023-05-15 ENCOUNTER — Ambulatory Visit: Admit: 2023-05-15 | Discharge: 2023-05-15 | Payer: MEDICARE | Attending: Internal Medicine | Primary: Internal Medicine

## 2023-05-15 DIAGNOSIS — I10 Essential (primary) hypertension: Secondary | ICD-10-CM

## 2023-05-15 MED ORDER — CELEBREX 200 MG PO CAPS
200 MG | ORAL_CAPSULE | Freq: Every day | ORAL | 0 refills | Status: DC
Start: 2023-05-15 — End: 2023-05-23

## 2023-05-15 MED ORDER — POTASSIUM CHLORIDE ER 10 MEQ PO TBCR
10 MEQ | ORAL_TABLET | ORAL | 1 refills | Status: DC
Start: 2023-05-15 — End: 2023-05-23

## 2023-05-15 MED ORDER — PREDNISONE 5 MG PO TABS
5 MG | ORAL_TABLET | ORAL | 0 refills | Status: DC
Start: 2023-05-15 — End: 2023-05-23

## 2023-05-15 MED ORDER — TADALAFIL 20 MG PO TABS
20 MG | ORAL_TABLET | ORAL | 0 refills | Status: DC
Start: 2023-05-15 — End: 2023-05-23

## 2023-05-15 MED ORDER — BUMETANIDE 1 MG PO TABS
1 MG | ORAL_TABLET | Freq: Every day | ORAL | 0 refills | Status: DC
Start: 2023-05-15 — End: 2023-05-29

## 2023-05-15 MED ORDER — LOSARTAN POTASSIUM 100 MG PO TABS
100 | ORAL_TABLET | Freq: Every day | ORAL | 0 refills | Status: DC
Start: 2023-05-15 — End: 2023-08-16

## 2023-05-15 MED ORDER — ATORVASTATIN CALCIUM 20 MG PO TABS
20 | ORAL_TABLET | Freq: Every evening | ORAL | 0 refills | Status: DC
Start: 2023-05-15 — End: 2023-08-16

## 2023-05-15 MED ORDER — OMEPRAZOLE 20 MG PO CPDR
20 | ORAL_CAPSULE | Freq: Every day | ORAL | 0 refills | Status: DC
Start: 2023-05-15 — End: 2023-08-16

## 2023-05-15 NOTE — Progress Notes (Signed)
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  -  Jeffrey [name] has a history of atrial fibrillation had ablation treatment and a Watchman procedure last year currently asymptomatic from cardiac standpoint.  EKG done today showed sinus rhythm right bundle branch block occasional ectopy.  -Jeffrey [name] also has a history of hypertension hyperlipidemia currently on losartan and atorvastatin  -Jeffrey [name] is currently on prednisone 5 mg tablet daily according to him was started in the emergency room when he woke up 1 day with generalized stiffness, reviewing the records it was mentioned a diagnosis of polymyalgia rheumatica he has been on prednisone for about 9 months, the highest sed rate on the record in May 2023 was 18  Jeffrey [name] also stated that he takes prednisone for arthritis in addition to Celebrex 200 mg capsule daily  -Jeffrey [name] is on Cialis 20 mg tablet daily however he denied any nocturia or poor stream of urine and stated that his main reason for it is erectile dysfunction.  -Jeffrey [name] had EMG recently showed carpal tunnel syndrome severe on the left moderate on the right scheduled for surgery next month  Requested preoperative medical clearance      Past Medical History, Surgical History, and Family History has been reviewed and updated.    Review of Systems:  Constitutional:  No fever, no fatigue, no chills, no headaches, no weight change  Dermatology:  No rash, no mole, no dry or sensitive skin  ENT:  No cough, no sore throat, no sinus pain, no runny nose, no ear pain  Cardiology:  No chest pain, no palpitations, no leg edema, no shortness of breath, no PND  Gastroenterology:  No dysphagia, no abdominal pain, no nausea, no vomiting, no constipation, no diarrhea, no heartburn  Musculoskeletal:  No joint pain, no leg  cramps, no back pain, no muscle aches  Respiratory:  No shortness of breath, no orthopnea, no wheezing, no DOE, no hemoptysis  Urology:  No blood in the urine, no urinary frequency, no urinary incontinence, no urinary urgency, no nocturia, no dysuria    Vitals:    05/15/23 0914   BP: 136/82   Site: Right Upper Arm   Position: Sitting   Cuff Size: Medium Adult   Pulse: 68   Temp: 97.9 F (36.6 C)   TempSrc: Temporal   SpO2: 96%   Weight: 103.7 kg (228 lb 11.2 oz)   Height: 1.727 m (5' 7.99")       General:  Jeffrey [name] alert and oriented x 3, NAD, pleasant  HEENT:  Atraumatic, normocephalic, PERRLA, EOMI, clear conjunctiva, TMs clear, nose-clear, throat - no erythema  Neck:  Supple, no goiter, no carotid bruits, no LAD  Lungs:  CTA   Heart:  RRR, no murmurs, gallops or rubs  Abdomen:  Soft/nt/nd, + bowel sounds  Lymph node examination: unremarkable  Neurological exam : unremarkable  Extremities:  No clubbing, cyanosis or edema  Skin: unremarkable    Hemoglobin A1C   Date Value Ref Range Status   09/05/2019 5.3 4.0 - 5.6 % Final     Cholesterol, Total   Date Value Ref Range Status   09/05/2019 177 0 - 199 mg/dL Final     Triglycerides   Date Value Ref Range Status   09/05/2019 182 (H) 0 -  149 mg/dL Final     HDL   Date Value Ref Range Status   09/05/2019 34 >40 mg/dL Final     VLDL Cholesterol Calculated   Date Value Ref Range Status   09/05/2019 36 mg/dL Final     Sodium   Date Value Ref Range Status   06/23/2022 138 132 - 146 mmol/L Final     Potassium   Date Value Ref Range Status   06/23/2022 4.2 3.5 - 5.0 mmol/L Final     Chloride   Date Value Ref Range Status   06/23/2022 100 98 - 107 mmol/L Final     CO2   Date Value Ref Range Status   06/23/2022 26 22 - 29 mmol/L Final     BUN   Date Value Ref Range Status   06/23/2022 19 6 - 23 mg/dL Final     Creatinine   Date Value Ref Range Status   06/23/2022 0.9 0.7 - 1.2 mg/dL Final     Glucose   Date Value Ref Range Status   06/23/2022 99 74 - 99 mg/dL Final     Calcium    Date Value Ref Range Status   06/23/2022 9.0 8.6 - 10.2 mg/dL Final     Total Bilirubin   Date Value Ref Range Status   05/11/2022 0.6 0.0 - 1.2 mg/dL Final     Alkaline Phosphatase   Date Value Ref Range Status   05/11/2022 73 40 - 129 U/L Final     AST   Date Value Ref Range Status   05/11/2022 18 0 - 39 U/L Final     ALT   Date Value Ref Range Status   05/11/2022 21 0 - 40 U/L Final     Est, Glom Filt Rate   Date Value Ref Range Status   06/23/2022 >60 >=60 mL/min/1.73 Final     Comment:     Pediatric calculator link  https://www.kidney.org/professionals/kdoqi/gfr_calculatorped  Effective Sep 26, 2021  These results are not intended for use in patients  <57 years of age.  eGFR results are calculated without  a race factor using the 2021 CKD-EPI equation.  Careful  clinical correlation is recommended, particularly when  comparing to results calculated using previous equations.  The CKD-EPI equation is less accurate in patients with  extremes of muscle mass, extra-renal metabolism of  creatinine, excessive creatinine ingestion, or following  therapy that affects renal tubular secretion.       GFR African American   Date Value Ref Range Status   10/04/2021 >60  Final        No results found.     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  Scheduled for surgery on the left wrist by orthopedic surgeon  Obesity  Jeffrey [name] was diagnosed with PMR currently on prednisone 5 mg tablet daily discussed with Jeffrey [name] weaning slowly to 5 mg tablet every other day and 2 and half milligrams tablet every other day  Preop medical risk assessment    Outpatient Encounter  Medications as of 05/15/2023   Medication Sig Dispense Refill    bumetanide (  BUMEX) 1 MG tablet Take 1 tablet by mouth daily      CELEBREX 200 MG capsule Take 1 capsule by mouth 2 times daily      tadalafil (CIALIS) 20 MG tablet Take 1 tablet by mouth daily as needed for Erectile Dysfunction 90 tablet 3    atorvastatin (LIPITOR) 20 MG tablet Take 1 tablet by mouth nightly 90 tablet 1    losartan (COZAAR) 100 MG tablet Take 1 tablet by mouth daily 90 tablet 1    predniSONE (DELTASONE) 5 MG tablet Take 1 tablet by mouth 2 times daily 180 tablet 1    omeprazole (PRILOSEC) 20 MG delayed release capsule Take 1 capsule by mouth daily 90 capsule 1    potassium chloride (KLOR-CON 10) 10 MEQ extended release tablet Take 1 tablet by mouth every other day 90 tablet 1    aspirin 81 MG EC tablet Take 1 tablet by mouth daily      hydrOXYzine HCl (ATARAX) 25 MG tablet Take 1 tablet by mouth as needed for Itching       No facility-administered encounter medications on file as of 05/15/2023.        Claudy was seen today for hypertension.    Diagnoses and all orders for this visit:    Primary hypertension    Hyperlipidemia, unspecified hyperlipidemia type    PMR (polymyalgia rheumatica) (HCC)    Benign prostatic hyperplasia with urinary frequency    Paroxysmal atrial fibrillation (HCC)    Prostate cancer screening    Erectile dysfunction, unspecified erectile dysfunction type         There are no Jeffrey [name] Instructions on file for this visit.     On this date 05/15/2023 I have spent 60 minutes reviewing previous notes, test results and face to face with the Jeffrey [name] discussing the diagnosis and importance of compliance with the treatment plan as well as documenting on the day of the visit.      [name], MD   05/15/23

## 2023-05-22 ENCOUNTER — Inpatient Hospital Stay: Payer: MEDICARE | Primary: Internal Medicine

## 2023-05-22 DIAGNOSIS — Z125 Encounter for screening for malignant neoplasm of prostate: Secondary | ICD-10-CM

## 2023-05-22 LAB — PSA SCREENING: PSA: 1.96 ng/mL (ref 0.00–4.00)

## 2023-05-23 ENCOUNTER — Inpatient Hospital Stay
Admission: EM | Admit: 2023-05-23 | Discharge: 2023-05-26 | Disposition: A | Discharge status: Home / Self Care | Payer: MEDICARE | Admitting: Internal Medicine

## 2023-05-23 ENCOUNTER — Emergency Department: Admit: 2023-05-23 | Payer: MEDICARE | Primary: Internal Medicine

## 2023-05-23 ENCOUNTER — Ambulatory Visit: Admit: 2023-05-23 | Discharge: 2023-05-23 | Payer: MEDICARE | Attending: Internal Medicine | Primary: Internal Medicine

## 2023-05-23 ENCOUNTER — Inpatient Hospital Stay: Payer: MEDICARE | Primary: Internal Medicine

## 2023-05-23 DIAGNOSIS — I5043 Acute on chronic combined systolic (congestive) and diastolic (congestive) heart failure: Secondary | ICD-10-CM

## 2023-05-23 DIAGNOSIS — I110 Hypertensive heart disease with heart failure: Secondary | ICD-10-CM

## 2023-05-23 DIAGNOSIS — R0981 Nasal congestion: Secondary | ICD-10-CM

## 2023-05-23 DIAGNOSIS — M353 Polymyalgia rheumatica: Secondary | ICD-10-CM

## 2023-05-23 LAB — COMPREHENSIVE METABOLIC PANEL W/ REFLEX TO MG FOR LOW K
ALT: 16 U/L (ref 0–40)
AST: 19 U/L (ref 0–39)
Albumin: 4.1 g/dL (ref 3.5–5.2)
Alkaline Phosphatase: 84 U/L (ref 40–129)
Anion Gap: 11 mmol/L (ref 7–16)
BUN: 12 mg/dL (ref 6–23)
CO2: 28 mmol/L (ref 22–29)
Calcium: 8.6 mg/dL (ref 8.6–10.2)
Chloride: 104 mmol/L (ref 98–107)
Creatinine: 0.8 mg/dL (ref 0.70–1.20)
Est, Glom Filt Rate: >90 mL/min/{1.73_m2} (ref >60)
Glucose: 92 mg/dL (ref 74–99)
Potassium: 4.1 mmol/L (ref 3.5–5.0)
Sodium: 143 mmol/L (ref 132–146)
Total Bilirubin: 0.3 mg/dL (ref 0.0–1.2)
Total Protein: 7.5 g/dL (ref 6.4–8.3)

## 2023-05-23 LAB — CBC
Hematocrit: 40.8 % (ref 37.0–54.0)
Hemoglobin: 13.4 g/dL (ref 12.5–16.5)
MCH: 28.7 pg (ref 26.0–35.0)
MCHC: 32.8 g/dL (ref 32.0–34.5)
MCV: 87.4 fL (ref 80.0–99.9)
MPV: 9.7 fL (ref 7.0–12.0)
Platelets: 197 10*3/uL (ref 130–450)
RBC: 4.67 m/uL (ref 3.80–5.80)
RDW: 14.1 % (ref 11.5–15.0)
WBC: 8.1 10*3/uL (ref 4.5–11.5)

## 2023-05-23 LAB — CBC WITH AUTO DIFFERENTIAL
Basophils %: 1 % (ref 0.0–2.0)
Basophils Absolute: 0.05 10*3/uL (ref 0.00–0.20)
Eosinophils %: 1 % (ref 0–6)
Eosinophils Absolute: 0.11 10*3/uL (ref 0.05–0.50)
Hematocrit: 41.7 % (ref 37.0–54.0)
Hemoglobin: 13.8 g/dL (ref 12.5–16.5)
Immature Granulocytes %: 1 % (ref 0.0–5.0)
Immature Granulocytes Absolute: 0.04 10*3/uL (ref 0.00–0.58)
Lymphocytes %: 10 % — ABNORMAL LOW (ref 20.0–42.0)
Lymphocytes Absolute: 0.79 10*3/uL — ABNORMAL LOW (ref 1.50–4.00)
MCH: 29 pg (ref 26.0–35.0)
MCHC: 33.1 g/dL (ref 32.0–34.5)
MCV: 87.6 fL (ref 80.0–99.9)
MPV: 9.9 fL (ref 7.0–12.0)
Monocytes %: 8 % (ref 2.0–12.0)
Monocytes Absolute: 0.65 10*3/uL (ref 0.10–0.95)
Neutrophils %: 80 % (ref 43.0–80.0)
Neutrophils Absolute: 6.34 10*3/uL (ref 1.80–7.30)
Platelets: 208 10*3/uL (ref 130–450)
RBC: 4.76 m/uL (ref 3.80–5.80)
RDW: 14.1 % (ref 11.5–15.0)
WBC: 8 10*3/uL (ref 4.5–11.5)

## 2023-05-23 LAB — INFLUENZA A + B, PCR
Influenza A by PCR: NOT DETECTED
Influenza B by PCR: NOT DETECTED

## 2023-05-23 LAB — COMPREHENSIVE METABOLIC PANEL
ALT: 15 U/L (ref 0–40)
AST: 19 U/L (ref 0–39)
Albumin: 4.4 g/dL (ref 3.5–5.2)
Alkaline Phosphatase: 74 U/L (ref 40–129)
Anion Gap: 22 mmol/L — ABNORMAL HIGH (ref 7–16)
BUN: 25 mg/dL — ABNORMAL HIGH (ref 6–23)
CO2: 18 mmol/L — ABNORMAL LOW (ref 22–29)
Calcium: 8.9 mg/dL (ref 8.6–10.2)
Chloride: 103 mmol/L (ref 98–107)
Creatinine: 0.9 mg/dL (ref 0.70–1.20)
Est, Glom Filt Rate: 83 mL/min/{1.73_m2} (ref >60)
Glucose: 79 mg/dL (ref 74–99)
Potassium: 4.1 mmol/L (ref 3.5–5.0)
Sodium: 143 mmol/L (ref 132–146)
Total Bilirubin: 0.3 mg/dL (ref 0.0–1.2)
Total Protein: 7.3 g/dL (ref 6.4–8.3)

## 2023-05-23 LAB — COVID-19, RAPID: SARS-CoV-2, Rapid: NOT DETECTED

## 2023-05-23 LAB — SEDIMENTATION RATE: Sed Rate, Automated: 21 mm/Hr — ABNORMAL HIGH (ref 0–15)

## 2023-05-23 LAB — LIPID PANEL
Cholesterol, Total: 151 mg/dL (ref <200)
HDL: 45 mg/dL (ref >40)
LDL Cholesterol: 84 mg/dL (ref <100)
Triglycerides: 111 mg/dL (ref <150)
VLDL: 22 mg/dL

## 2023-05-23 LAB — BRAIN NATRIURETIC PEPTIDE: Pro-BNP: 265 pg/mL (ref 0–450)

## 2023-05-23 LAB — TROPONIN
Troponin, High Sensitivity: 16 ng/L — ABNORMAL HIGH (ref 0–11)
Troponin, High Sensitivity: 16 ng/L — ABNORMAL HIGH (ref 0–11)

## 2023-05-23 MED ORDER — IOPAMIDOL 76 % IV SOLN
76 | Freq: Once | INTRAVENOUS | Status: AC | PRN
Start: 2023-05-23 — End: 2023-05-23
  Administered 2023-05-23: 23:00:00 75 mL via INTRAVENOUS

## 2023-05-23 NOTE — H&P (Signed)
Internal Medicine Consult Note    [facility]    Jeffrey [name]. Joelee Snoke, D.O., F.A.C.O.I.                    Talitha Givens, D.O., F.A.C.O.I.                             Hoy Register, D.O.     Franchot Heidelberg, MSN, APRN, NP-C  Jeffrey [name]. Earlene Plater, MSN, APRN-CNP  Urbano Heir, MSN, APRN-CNP  Vilinda Boehringer, MSN, APRN-CNP  Lucy Antigua, MSN, APRN-CNP     Department of Internal Medicine  History and Physical    PCP: Dr. Shona Simpson   Admitting Physician: Dr. Harlin Rain  Consultants: Dr. Joseph Art, Dr. Arnette Norris      CHIEF COMPLAINT:  shortness of breath     HISTORY OF PRESENT ILLNESS:    Saw Dr. Shona Simpson advised to come here for CHF exacerbation.  He states he has a history of A-fib and was on blood thinners until having a Watchman procedure which was about a year and a half ago.  He has been on tapering oral prednisone since that time.  No current blood thinner use.  He has a scheduled outpatient carpal tunnel surgery with Dr. Joseph Art this Friday has numbness and tingling in the left hand.  He has been having ongoing fatigue also associated with exertional dyspnea for the past month.  He has a dry nonproductive cough and notes an increase in his lower extremity edema however he did not take his Bumex this morning.  PAST MEDICAL Hx:  Past Medical History:   Diagnosis Date    Atrial fibrillation (HCC)     Has a Watchman device    GERD (gastroesophageal reflux disease)     Hyperlipidemia     Hypertension     Sleep apnea     "uses the mouth piece"       PAST SURGICAL Hx:   Past Surgical History:   Procedure Laterality Date    APPENDECTOMY      CARDIAC SURGERY      watchman implant    COLONOSCOPY      HERNIA REPAIR         FAMILY Hx:  Family History   Problem Relation Age of Onset    Heart Attack Father        HOME MEDICATIONS:  Prior to Admission medications    Medication Sig Start Date End Date Taking? Authorizing Provider   celecoxib (CELEBREX) 200 MG capsule Take 1 capsule by mouth nightly   Yes [provider]    potassium chloride (KLOR-CON M) 10 MEQ extended release tablet Take 1 tablet by mouth three times a week Sunday, Tuesday, Thursday   Yes [provider]   predniSONE (DELTASONE) 5 MG tablet Take 1 tablet by mouth daily   Yes [provider]   atorvastatin (LIPITOR) 20 MG tablet Take 1 tablet by mouth nightly 05/15/23   Ragheb, Essam S, MD   bumetanide (BUMEX) 1 MG tablet Take 1 tablet by mouth daily 05/15/23 08/13/23  Ragheb, Essam S, MD   losartan (COZAAR) 100 MG tablet Take 1 tablet by mouth daily 05/15/23   Ragheb, Essam S, MD   omeprazole (PRILOSEC) 20 MG delayed release capsule Take 1 capsule by mouth daily 05/15/23   Ragheb, Essam S, MD       ALLERGIES:  Lidocaine, Lidocaine-epinephrine, Cephalexin, and Sulfa antibiotics    SOCIAL Hx:  Social History     Socioeconomic History    Marital status: Married     Spouse name: Not on file    Number of children: 1    Years of education: Not on file    Highest education level: Not on file   Occupational History    Not on file   Tobacco Use    Smoking status: Never    Smokeless tobacco: Never   Vaping Use    Vaping Use: Never used   Substance and Sexual Activity    Alcohol use: Yes     Alcohol/week: 2.0 standard drinks of alcohol     Types: 2 Shots of liquor per week     Comment: 2 drinks vodka a day     Drug use: Never    Sexual activity: Yes   Other Topics Concern    Not on file   Social History Narrative    Not on file     Social Determinants of Health     Financial Resource Strain: Low Risk  (06/01/2022)    Overall Financial Resource Strain (CARDIA)     Difficulty of Paying Living Expenses: Not hard at all   Food Insecurity: Not on file (06/01/2022)   Transportation Needs: Unknown (06/01/2022)    PRAPARE - Therapist, art (Medical): Not on file     Lack of Transportation (Non-Medical): No   Physical Activity: Inactive (04/30/2023)    Exercise Vital Sign     Days of Exercise per Week: 0 days     Minutes of Exercise per Session: 0 min    Stress: Not on file   Social Connections: Not on file   Intimate Partner Violence: Not on file   Housing Stability: Unknown (06/01/2022)    Housing Stability Vital Sign     Unable to Pay for Housing in the Last Year: Not on file     Number of Places Lived in the Last Year: Not on file     Unstable Housing in the Last Year: No       ROS:  General:   Denies chills, fever, malaise, night sweats or weight loss.  Endorses fatigue with too much physical exertion    Psychological:   Denies anxiety, disorientation or hallucinations    ENT:    Denies epistaxis, headaches, vertigo or visual changes    Cardiovascular:   Denies any chest pain, irregular heartbeats, or palpitations. No paroxysmal nocturnal dyspnea.    Respiratory:   Denies hemoptysis, or wheezing.  No orthopnea. Endorses shortness of breath with physical exertion and clear sputum production.      Gastrointestinal:   Denies nausea, vomiting, diarrhea, or constipation.  Denies any abdominal pain.  Denies change in bowel habits or stools.      Genito-Urinary:    Denies any urgency, frequency, hematuria.  Voiding without difficulty.    Musculoskeletal:   Denies joint pain, joint stiffness, joint swelling or muscle pain    Neurology:    Denies any headache or focal neurological deficits. No weakness or paresthesia.    Derm:    Denies any rashes, ulcers, or excoriations. Endorses generalized bruising.       Extremities:   Endorses small lower extremity edema but did not take bumex this morning.       PHYSICAL EXAM:  VITALS:  Vitals:    05/23/23 1614   BP: (!) 180/84   Pulse:    Temp:    SpO2:  CONSTITUTIONAL:    Awake, alert, cooperative, no apparent distress, and appears stated age    EYES:    PERRL, EOMI, sclera clear, conjunctiva normal    ENT:    Normocephalic, atraumatic, sinuses nontender on palpation. External ears without lesions. Oral pharynx with moist mucus membranes.  Tonsils without erythema or exudates.    NECK:    Supple, symmetrical, trachea  midline, no adenopathy, thyroid symmetric, not enlarged and no tenderness, skin normal, no bruits, no JVD    HEMATOLOGIC/LYMPHATICS:    No cervical lymphadenopathy and no supraclavicular lymphadenopathy    LUNGS:    Symmetric. No increased work of breathing, good air exchange, diminished with scant expiratory wheezes to auscultation bilaterally, no wheezes, rhonchi, or rales, on room air    CARDIOVASCULAR:    Normal apical impulse, regular rate and rhythm, normal S1 and S2, no S3 or S4, and no murmur noted    ABDOMEN:    No scars, normal bowel sounds, soft, non-distended, non-tender, no masses palpated, no hepatosplenomegaly, no rebound or guarding elicited on palpation     MUSCULOSKELETAL:    There is no redness, warmth, or swelling of the joints.  Full range of motion noted.  Motor strength is 5 out of 5 all extremities bilaterally.  Tone is normal.    NEUROLOGIC:    Awake, alert, oriented to name, place and time.  Cranial nerves II-XII are grossly intact.  Motor is 5 out of 5 bilaterally.      SKIN:    Generalized bruising to upper extremities    EXTREMITIES:    Peripheral pulses present.  Trace bilateral lower extremity edema    LABS::  Lab Results   Component Value Date    WBC 8.0 05/23/2023    HGB 13.8 05/23/2023    HCT 41.7 05/23/2023    PLT 208 05/23/2023    NA 143 05/23/2023    K 4.1 05/23/2023    CL 104 05/23/2023    CREATININE 0.8 05/23/2023    BUN 12 05/23/2023    CO2 28 05/23/2023    GLUCOSE 92 05/23/2023    ALT 16 05/23/2023    AST 19 05/23/2023     No results found for: "INR", "PROTIME"   Lab Results   Component Value Date    TSH 1.940 06/23/2022     Lab Results   Component Value Date    TRIG 111 05/22/2023    TRIG 182 (H) 09/05/2019     Lab Results   Component Value Date    HDL 45 05/22/2023    HDL 34 09/05/2019     No components found for: "LDLCALC"  Lab Results   Component Value Date    LABA1C 5.3 09/05/2019       IMAGING:  CTA PULMONARY W CONTRAST    Result Date: 05/23/2023  EXAMINATION: CTA OF THE  CHEST 05/23/2023 5:40 pm TECHNIQUE: CTA of the chest was performed after the administration of intravenous contrast.  Multiplanar reformatted images are provided for review.  MIP images are provided for review. Automated exposure control, iterative reconstruction, and/or weight based adjustment of the mA/kV was utilized to reduce the radiation dose to as low as reasonably achievable. COMPARISON: None. HISTORY: ORDERING SYSTEM PROVIDED HISTORY: evaluate for PE TECHNOLOGIST PROVIDED HISTORY: Reason for exam:->evaluate for PE Additional Contrast?->1 FINDINGS: Pulmonary Arteries: Pulmonary arteries are adequately opacified for evaluation.  No evidence of intraluminal filling defect to suggest pulmonary embolism.  Main pulmonary artery is normal in caliber. Mediastinum: No evidence of  mediastinal lymphadenopathy. There is moderate coronary calcification.  The heart is normal in size. There is no acute abnormality of the thoracic aorta.  There is moderate tortuosity of the thoracic aorta without aneurysmal dilatation. Lungs/pleura: The lungs are without acute process.  No focal consolidation or pulmonary edema.  No evidence of pleural effusion or pneumothorax. Upper Abdomen: Calcified granulomata are seen in the otherwise normal appearing spleen. A few cysts are seen in the left kidney, the largest in the interpolar region measuring 3.0 cm in diameter. There is a small hiatal hernia. Soft Tissues/Bones: No acute bone or soft tissue abnormality.     1. No evidence of pulmonary embolism or acute pulmonary abnormality. 2. Moderate coronary calcification. 3. Small hiatal hernia.     XR CHEST 1 VIEW    Result Date: 05/23/2023  EXAMINATION: ONE XRAY VIEW OF THE CHEST 05/23/2023 4:55 pm COMPARISON: 06/23/2022 HISTORY: ORDERING SYSTEM PROVIDED HISTORY: chf TECHNOLOGIST PROVIDED HISTORY: Reason for exam:->chf FINDINGS: Mildly low lung volumes.  Mild central bronchial wall thickening and chronic interstitial coarsening.  Bibasilar  subsegmental atelectasis or scarring. Atherosclerotic vascular calcifications.  Cardiac silhouette borderline enlarged, accentuated by portable projection.     Chronic findings without appreciable acute process.       ASSESSMENT:  Acute exacerbation of CHF on home regimen  Elevated troponin no delta change likely due to demand ischemia  Hyperlipidemia on statin  GERD on PPI  Essential hypertension on ARB  Carpal tunnel syndrome left wrist    PLAN:  Admit to monitored floor. Home medications will be reconciled. Labwork, EKG, ECHO ordered for morning. Antiemetics, antipyretics, as needed pain medicine, hypoglycemic recovery medications have been ordered. Consults will be made to cardiology and orthopedic surgery. Last ECHO 2022 with EF 55%. Planned carpal tunnel release surgery for 5/31 with Dr. Joseph Art. Oral prednisone reordered but hold placed for this admission.  He will be getting IV steroids, scheduled bronchodilators, PRN breathing treatments. Respiratory culture to be sent if able to obtain.  Incentive spirometry, Pep flutter.  Blood sugars ACHS with sliding scale coverage due to steroid usage.      The patient was seen, examined and then discussed with Dr. Shade Flood, APRN - CNP  9:23 PM  05/23/2023    Electronically signed by Rosana Berger, APRN - CNP on 05/23/23 at 9:23 PM EDT      I have personally participated in the history, exam, medical decision making with the nurse practitioner on the date of service and I agree with all the pertinent clinical information unless otherwise noted.  I have also reviewed and agree with the past medical, family, and social history unless otherwise noted.      Alvin Critchley, DO, D.O., FACOI  5:59 AM  05/24/2023

## 2023-05-23 NOTE — ED Provider Notes (Incomplete)
Jeffrey [name] is a 77 year old male who presents emergency department concern for shortness of breath.  Patient has had increasing shortness of breath and a nonproductive cough for 3 days.  Patient went to his PCP today and was sent in for evaluation.  Patient was prescribed Bumex but has not taken it for at least 1 day.  Patient states he does have chronic lower extremity edema.  Patient does have a history of A-fib and is status post watchman patient has not had any recent surgery.  Patient denies chest pain.  Patient denies fever, chills, nausea, vomiting, abdominal pain    The history is provided by the patient and medical records.        Review of Systems   Constitutional:  Negative for chills, diaphoresis, fatigue and fever.   Eyes:  Negative for photophobia and visual disturbance.   Respiratory:  Positive for cough and shortness of breath. Negative for chest tightness.    Cardiovascular:  Positive for leg swelling. Negative for chest pain and palpitations.   Gastrointestinal:  Negative for abdominal distention, abdominal pain, diarrhea, nausea and vomiting.   Genitourinary:  Negative for dysuria.   Musculoskeletal:  Negative for back pain, neck pain and neck stiffness.   Skin:  Negative for pallor and rash.   Neurological:  Negative for headaches.   Psychiatric/Behavioral:  Negative for confusion.         Physical Exam  Vitals and nursing note reviewed.   Constitutional:       General: He is not in acute distress.     Appearance: Normal appearance. He is not ill-appearing.   HENT:      Head: Normocephalic and atraumatic.   Eyes:      General: No scleral icterus.     Conjunctiva/sclera: Conjunctivae normal.      Pupils: Pupils are equal, round, and reactive to light.   Cardiovascular:      Rate and Rhythm: Normal rate and regular rhythm.   Pulmonary:      Effort: Pulmonary effort is normal.      Breath sounds: Rales present.   Abdominal:      General: Bowel sounds are normal. There is no distension.       Palpations: Abdomen is soft.      Tenderness: There is no abdominal tenderness. There is no guarding or rebound.   Musculoskeletal:      Cervical back: Normal range of motion and neck supple. No rigidity. No muscular tenderness.      Right lower leg: Edema present.      Left lower leg: Edema present.   Skin:     General: Skin is warm and dry.      Capillary Refill: Capillary refill takes less than 2 seconds.      Coloration: Skin is not pale.      Findings: No erythema or rash.   Neurological:      Mental Status: He is alert and oriented to person, place, and time.   Psychiatric:         Mood and Affect: Mood normal.          Procedures     MDM  Number of Diagnoses or Management Options  Diagnosis management comments: Jeffrey [name] is a 77 year old male present emergency department concern for shortness of breath  Patient does have Rales on exam patient vital signs were reviewed patient was hypertensive with a blood pressure of 180/84, patient is afebrile not tachycardic not hypoxic patient does not appear  to be in any distress  Patient did have lower extremity edema up to the knee  Lab work was reviewed interpreted by myself ***  Chest x-ray was reviewed interpreted by myself confirmed by radiology ***                  SEP-1 CORE MEASURE DATA      Sepsis Criteria   Severe Sepsis Criteria   Septic Shock Criteria     Must be confirmed or suspected to move forward with diagnosis of sepsis.    Must meet 2:    []  Temperature > 100.9 F (38.3 C)        or < 96.8 F (36 C)  []  HR > 90  []  RR > 20  []  WBC > 12 or < 4 or 10% bands    AND:    []  Infection Confirmed or        Suspected.    OR:    []  Exclude from SEP-1 because:    []  No infection present or suspected  []  Does not have 2+ SIRS criteria but may have an incidental infection that requires treatment  []  May have sepsis, but does not meet criteria for severe sepsis or septic shock  []  Alternative explanation for abnormal labs and/or vitals (see MDM)  []  Viral etiology  found or highly suspected (including COVID-19) without concomitant bacterial infection   Must meet 1:    []  Lactate > 2       or   []  Signs of Organ Dysfunction:    - SBP < 90 or MAP < 65  - Altered mental status  - Creatinine > 2 or increased from      baseline  - Urine Output < 0.5 ml/kg/hr  - Bilirubin > 2  - INR > 1.5 (not anticoagulated)  - Platelets < 100,000  - Acute Respiratory Failure as     evidenced by new need for NIPPV     or mechanical ventilation        []  No criteria met for Severe Sepsis.   Must meet 1:    []  Lactate > 4        or   []  SBP < 90 or MAP < 65 for at        least two readings in the first        hour after fluid bolus        administration      []  Vasopressors initiated (if hypotension persists after fluid resuscitation)                []  No criteria met for Septic Shock.   Patient Vitals for the past 6 hrs:   BP Temp Pulse SpO2   05/23/23 1523 -- 97.9 F (36.6 C) 79 97 %   05/23/23 1614 (!) 180/84 -- -- --      Recent Labs     05/22/23  0822 05/23/23  0817   WBC  --  8.1   CREATININE 0.9  --    BILITOT 0.3  --    PLT  --  197         Sepsis Time Identified: ***    Fluid Resuscitation Rational: {fluids:19197::"at least 35mL/kg based on ideal body weight due to obesity defined as BMI >30 (patient's BMI is There is no height or weight on file to calculate BMI. and IBW is Ideal body weight: 68.4 kg (150 lb 12.7 oz)Adjusted ideal body weight:  81.9 kg (180 lb 7.6 oz))","at least 60mL/kg based on entered actual weight at time of triage","less than 60mL/kg because of concomitant acute pulmonary edema and patient/patient advocate made an informed decision to decline more aggressive fluid resuscitation","less than 18mL/kg because of a history of ESRD or stage IV/V CKD.  Instead, *** was ordered.  More fluid initially would be potentially detrimental to the patient","less than 89mL/kg because of a history of CHF NYHA III or IV with symptoms with minimal exertion/at rest.  Instead, *** was  ordered.  More fluid initially would be potentially detrimental to the patient","less than 72mL/kg because ***"}      Infection Source: {Infection Source:37458}    Repeat lactate level: {lactic #2:19197::"improving","worsening","pending","not indicated due to initial lactate < 2"}    Reassessment Exam:   {Reevaluate:19197::"Not applicable. Patient does not have septic shock.","Vitals update: BP (!) 180/84   Pulse 79   Temp 97.9 F (36.6 C)   SpO2 97% , cardiopulmonary exam: ***, capillary refill: ***, peripheral pulses: ***, skin examination: ***","pending, to be completed by inpatient team","I have reassessed tissue perfusion and hemodynamic status after fluid bolus at this time: ***"}

## 2023-05-23 NOTE — Progress Notes (Signed)
Chief Complaint   Patient presents with    Cough     Pt is an acute with cough, congestion, gurgling and wheezing that started a few days ago with SOB. Pt is for Carpel Tunnel OR on Friday. Covid tested at this visit.        HPI:  Patient is here for acute visit.  Patient is here because of chest congestion shortness of breath started 2 days ago he suspected he has a cold but denied any fever sore throat chills, he slept last night with a couple of pillows under his head to breathe better.  Denied any chest pain  Patient was dyspneic at rest however pulse oximetry was 95% on room air.      Past Medical History, Surgical History, and Family History has been reviewed and updated.    Review of Systems:  Constitutional:  No fever, no fatigue, no chills, no headaches, no weight change  Dermatology:  No rash, no mole, no dry or sensitive skin  ENT:  No cough, no sore throat, no sinus pain, no runny nose, no ear pain  Cardiology:  No chest pain, no palpitations, no leg edema, no shortness of breath, no PND  Gastroenterology:  No dysphagia, no abdominal pain, no nausea, no vomiting, no constipation, no diarrhea, no heartburn  Musculoskeletal:  No joint pain, no leg cramps, no back pain, no muscle aches  Respiratory:  No shortness of breath, no orthopnea, no wheezing, no DOE, no hemoptysis  Urology:  No blood in the urine, no urinary frequency, no urinary incontinence, no urinary urgency, no nocturia, no dysuria    Vitals:    05/23/23 1408   BP: 130/80   Pulse: 76   Temp: 96.8 F (36 C)   TempSrc: Temporal   SpO2: 95%   Height: 1.727 m (5\' 8" )       General:  Patient alert and oriented x 3, NAD, pleasant  HEENT:  Atraumatic, normocephalic, PERRLA, EOMI, clear conjunctiva, TMs clear, nose-clear, throat - no erythema  Neck:  Supple, no goiter, no carotid bruits, no LAD  Lungs: + Bronchial breathing bilaterally diffuse expiratory rhonchi throughout both lung fields +bibasilar rales  Heart:  RRR, no murmurs, gallops or  rubs  Abdomen:  Soft/nt/nd, + bowel sounds  Lymph node examination: unremarkable  Neurological exam : unremarkable  Extremities:  No clubbing, cyanosis or edema  Skin: unremarkable    Hemoglobin A1C   Date Value Ref Range Status   09/05/2019 5.3 4.0 - 5.6 % Final     Cholesterol, Total   Date Value Ref Range Status   05/22/2023 151 <200 mg/dL Final     Triglycerides   Date Value Ref Range Status   05/22/2023 111 <150 mg/dL Final     HDL   Date Value Ref Range Status   05/22/2023 45 >40 mg/dL Final     VLDL   Date Value Ref Range Status   05/22/2023 22 mg/dL Final     Comment:     No normal range established.     Sodium   Date Value Ref Range Status   05/22/2023 143 132 - 146 mmol/L Final     Potassium   Date Value Ref Range Status   05/22/2023 4.1 3.5 - 5.0 mmol/L Final     Chloride   Date Value Ref Range Status   05/22/2023 103 98 - 107 mmol/L Final     CO2   Date Value Ref Range Status   05/22/2023 18 (  L) 22 - 29 mmol/L Final     BUN   Date Value Ref Range Status   05/22/2023 25 (H) 6 - 23 mg/dL Final     Creatinine   Date Value Ref Range Status   05/22/2023 0.9 0.70 - 1.20 mg/dL Final     Glucose   Date Value Ref Range Status   05/22/2023 79 74 - 99 mg/dL Final     Calcium   Date Value Ref Range Status   05/22/2023 8.9 8.6 - 10.2 mg/dL Final     Total Bilirubin   Date Value Ref Range Status   05/22/2023 0.3 0.0 - 1.2 mg/dL Final     Alkaline Phosphatase   Date Value Ref Range Status   05/22/2023 74 40 - 129 U/L Final     AST   Date Value Ref Range Status   05/22/2023 19 0 - 39 U/L Final     ALT   Date Value Ref Range Status   05/22/2023 15 0 - 40 U/L Final     Est, Glom Filt Rate   Date Value Ref Range Status   05/22/2023 83 >60 mL/min/1.23m2 Final     Comment:           These results are not intended for use in patients <78 years of age.        eGFR results are calculated without a race factor using the 2021 CKD-EPI equation.  Careful clinical correlation is recommended, particularly when comparing to results    calculated using previous equations.  The CKD-EPI equation is less accurate in patients with extremes of muscle mass, extra-renal   metabolism of creatine, excessive creatine ingestion, or following therapy that affects   renal tubular secretion.       GFR African American   Date Value Ref Range Status   10/04/2021 >60  Final        No results found.     No colonoscopy on file   No cologuard on file  No FIT/FOBT on file   No flexible sigmoidoscopy on file   No breast cancer screening on file  No cervical cancer screening on file      Assessment/Plan:    Congestive heart failure decompensated  Patient was advised to be transported to the emergency room he said he lives across the street he will drive his car to his house and have his wife drive him to the ER  Atrial fibs controlled  Essential hypertension controlled    Outpatient Encounter Medications as of 05/23/2023   Medication Sig Dispense Refill    atorvastatin (LIPITOR) 20 MG tablet Take 1 tablet by mouth nightly 90 tablet 0    bumetanide (BUMEX) 1 MG tablet Take 1 tablet by mouth daily 90 tablet 0    CELEBREX 200 MG capsule Take 1 capsule by mouth daily 90 capsule 0    losartan (COZAAR) 100 MG tablet Take 1 tablet by mouth daily 90 tablet 0    omeprazole (PRILOSEC) 20 MG delayed release capsule Take 1 capsule by mouth daily 90 capsule 0    potassium chloride (KLOR-CON 10) 10 MEQ extended release tablet Take 1 tablet by mouth every other day 90 tablet 1    predniSONE (DELTASONE) 5 MG tablet Take 1 tab qod alternating with 0.5 tab qod 90 tablet 0    tadalafil (CIALIS) 20 MG tablet 1 tab as needed 30 tablet 0     No facility-administered encounter medications on file as of 05/23/2023.  Jeffrey [name] was seen today for cough.    Diagnoses and all orders for this visit:    Congestion of nasal sinus  -     POCT COVID-19, Antigen         There are no Patient Instructions on file for this visit.     On this date 05/23/2023 I have spent 25 minutes reviewing previous  notes, test results and face to face with the patient discussing the diagnosis and importance of compliance with the treatment plan as well as documenting on the day of the visit.      [name], MD   05/23/23

## 2023-05-23 NOTE — ED Notes (Signed)
Pt maintained 97% on RA while ambulating. Pt denies any symptoms

## 2023-05-24 ENCOUNTER — Inpatient Hospital Stay: Admit: 2023-05-24 | Discharge: 2023-06-05 | Payer: MEDICARE | Primary: Internal Medicine

## 2023-05-24 ENCOUNTER — Inpatient Hospital Stay: Admit: 2023-05-24 | Payer: MEDICARE | Primary: Internal Medicine

## 2023-05-24 DIAGNOSIS — I110 Hypertensive heart disease with heart failure: Secondary | ICD-10-CM

## 2023-05-24 LAB — ECHO (TTE) COMPLETE (PRN CONTRAST/BUBBLE/STRAIN/3D)
AV Area by Peak Velocity: 2.7 cm2
AV Area by VTI: 2.9 cm2
AV Cusp Mmode: 2.1 cm
AV Mean Gradient: 3 mmHg
AV Mean Velocity: 0.8 m/s
AV Peak Gradient: 5 mmHg
AV Peak Velocity: 1.1 m/s
AV VTI: 22.3 cm
AV Velocity Ratio: 0.82
AVA/BSA Peak Velocity: 1.3 cm2/m2
AVA/BSA VTI: 1.3 cm2/m2
Body Surface Area: 2.23 m2
Est. RA Pressure: 3 mmHg
Fractional Shortening 2D: 32 % (ref 28–44)
IVSd: 1.3 cm — AB (ref 0.6–1.0)
IVSs: 1.9 cm
LA Diameter: 3.8 cm
LA Size Index: 1.77 cm/m2
LA Volume A-L A4C: 74 mL — AB (ref 18–58)
LA Volume A-L A4C: 79 mL — AB (ref 18–58)
LA Volume A/L: 79 mL
LA Volume BP: 73 mL — AB (ref 18–58)
LA Volume Index A-L A2C: 34 mL/m2 (ref 16–34)
LA Volume Index A-L A4C: 37 mL/m2 — AB (ref 16–34)
LA Volume Index A/L: 37 mL/m2 (ref 16–34)
LA Volume Index BP: 34 ml/m2 (ref 16–34)
LA Volume Index MOD A2C: 33 ml/m2 (ref 16–34)
LA Volume Index MOD A4C: 33 ml/m2 (ref 16–34)
LA Volume MOD A2C: 70 mL — AB (ref 18–58)
LA Volume MOD A4C: 72 mL — AB (ref 18–58)
LV IVRT: 87.5 ms
LV Mass 2D Index: 138 g/m2 — AB (ref 49–115)
LV Mass 2D: 296.6 g — AB (ref 88–224)
LV RWT Ratio: 0.43
LVIDd Index: 2.6 cm/m2
LVIDd: 5.6 cm (ref 4.2–5.9)
LVIDs Index: 1.77 cm/m2
LVIDs: 3.8 cm
LVOT Area: 3.1 cm2
LVOT Diameter: 2 cm
LVOT Mean Gradient: 2 mmHg
LVOT Peak Gradient: 3 mmHg
LVOT Peak Velocity: 0.9 m/s
LVOT SV: 63.1 ml
LVOT Stroke Volume Index: 29.4 mL/m2
LVOT VTI: 20.1 cm
LVOT:AV VTI Index: 0.9
LVPWd: 1.2 cm — AB (ref 0.6–1.0)
LVPWs: 1.4 cm
MV "A" Wave Duration: 167.5 msec
MV A Velocity: 0.91 m/s
MV Area by PHT: 3.1 cm2
MV Area by VTI: 1.6 cm2
MV E Velocity: 0.97 m/s
MV E Wave Deceleration Time: 247.9 ms
MV E/A: 1.07
MV Max Velocity: 0.9 m/s
MV Mean Gradient: 2 mmHg
MV Mean Velocity: 0.6 m/s
MV PHT: 71.5 ms
MV Peak Gradient: 3 mmHg
MV VTI: 38.7 cm
MV:LVOT VTI Index: 1.93
PV Max Velocity: 1.2 m/s
PV Mean Gradient: 3 mmHg
PV Mean Velocity: 0.9 m/s
PV Peak Gradient: 6 mmHg
PV VTI: 27.8 cm
RV Longitudinal Dimension: 6.6 cm
RV Mid Dimension: 2.4 cm
RVIDd: 2.4 cm
RVSP: 35 mmHg
TAPSE: 2.3 cm (ref 1.7–?)
TR Max Velocity: 2.82 m/s
TR Peak Gradient: 32 mmHg

## 2023-05-24 LAB — CBC WITH AUTO DIFFERENTIAL
Basophils %: 1 % (ref 0.0–2.0)
Basophils Absolute: 0.06 10*3/uL (ref 0.00–0.20)
Eosinophils %: 0 % (ref 0–6)
Eosinophils Absolute: 0 10*3/uL — ABNORMAL LOW (ref 0.05–0.50)
Hematocrit: 38 % (ref 37.0–54.0)
Hemoglobin: 13 g/dL (ref 12.5–16.5)
Lymphocytes %: 3 % — ABNORMAL LOW (ref 20.0–42.0)
Lymphocytes Absolute: 0.19 10*3/uL — ABNORMAL LOW (ref 1.50–4.00)
MCH: 29.5 pg (ref 26.0–35.0)
MCHC: 34.2 g/dL (ref 32.0–34.5)
MCV: 86.4 fL (ref 80.0–99.9)
MPV: 9.8 fL (ref 7.0–12.0)
Metamyelocytes Absolute: 0.06 10*3/uL (ref 0.00–0.12)
Metamyelocytes: 1 % (ref 0–1)
Monocytes %: 1 % — ABNORMAL LOW (ref 2.0–12.0)
Monocytes Absolute: 0.06 10*3/uL — ABNORMAL LOW (ref 0.10–0.95)
Neutrophils %: 95 % — ABNORMAL HIGH (ref 43.0–80.0)
Neutrophils Absolute: 6.92 10*3/uL (ref 1.80–7.30)
Platelets: 193 10*3/uL (ref 130–450)
RBC Morphology: NORMAL
RBC: 4.4 m/uL (ref 3.80–5.80)
RDW: 14.1 % (ref 11.5–15.0)
WBC: 7.3 10*3/uL (ref 4.5–11.5)

## 2023-05-24 LAB — EKG 12-LEAD
Atrial Rate: 63 {beats}/min
Atrial Rate: 68 {beats}/min
P Axis: 24 degrees
P Axis: 39 degrees
P-R Interval: 140 ms
P-R Interval: 144 ms
Q-T Interval: 464 ms
Q-T Interval: 478 ms
QRS Duration: 136 ms
QRS Duration: 142 ms
QTc Calculation (Bazett): 474 ms
QTc Calculation (Bazett): 508 ms
R Axis: -38 degrees
R Axis: -47 degrees
T Axis: -20 degrees
T Axis: -49 degrees
Ventricular Rate: 63 {beats}/min
Ventricular Rate: 68 {beats}/min

## 2023-05-24 LAB — COMPREHENSIVE METABOLIC PANEL W/ REFLEX TO MG FOR LOW K
ALT: 14 U/L (ref 0–40)
AST: 16 U/L (ref 0–39)
Albumin: 3.8 g/dL (ref 3.5–5.2)
Alkaline Phosphatase: 79 U/L (ref 40–129)
Anion Gap: 11 mmol/L (ref 7–16)
BUN: 15 mg/dL (ref 6–23)
CO2: 25 mmol/L (ref 22–29)
Calcium: 8.6 mg/dL (ref 8.6–10.2)
Chloride: 105 mmol/L (ref 98–107)
Creatinine: 0.8 mg/dL (ref 0.70–1.20)
Est, Glom Filt Rate: >90 mL/min/{1.73_m2} (ref >60)
Glucose: 177 mg/dL — ABNORMAL HIGH (ref 74–99)
Potassium: 3.8 mmol/L (ref 3.5–5.0)
Sodium: 141 mmol/L (ref 132–146)
Total Bilirubin: 0.3 mg/dL (ref 0.0–1.2)
Total Protein: 6.9 g/dL (ref 6.4–8.3)

## 2023-05-24 LAB — POCT GLUCOSE
POC Glucose: 138 mg/dL — ABNORMAL HIGH (ref 74–99)
POC Glucose: 153 mg/dL — ABNORMAL HIGH (ref 74–99)

## 2023-05-24 LAB — RESPIRATORY PANEL, MOLECULAR, WITH COVID-19
Adenovirus PCR: NOT DETECTED
B Pertussis by PCR: NOT DETECTED
Bordetella parapertussis by PCR: NOT DETECTED
Chlamydia pneumoniae By PCR: NOT DETECTED
Coronavirus 229E PCR: NOT DETECTED
Coronavirus HKU1 PCR: NOT DETECTED
Coronavirus NL63 PCR: NOT DETECTED
Coronavirus OC43 PCR: NOT DETECTED
Human Metapneumovirus PCR: NOT DETECTED
Influenza A by PCR: NOT DETECTED
Influenza B by PCR: NOT DETECTED
Mycoplasma pneumo by PCR: NOT DETECTED
Parainfluenza 1 PCR: NOT DETECTED
Parainfluenza 2 PCR: NOT DETECTED
Parainfluenza 3 PCR: DETECTED — AB
Parainfluenza 4 PCR: NOT DETECTED
Resp Syncytial Virus PCR: NOT DETECTED
Rhino/Enterovirus PCR: NOT DETECTED
SARS-CoV-2, PCR: NOT DETECTED

## 2023-05-24 LAB — LIPID PANEL
Cholesterol, Total: 126 mg/dL (ref <200)
HDL: 46 mg/dL (ref >40)
LDL Cholesterol: 70 mg/dL (ref <100)
Triglycerides: 51 mg/dL (ref <150)
VLDL: 10 mg/dL

## 2023-05-24 LAB — IRON AND TIBC
Iron % Saturation: 7 % — ABNORMAL LOW (ref 20–55)
Iron: 22 ug/dL — ABNORMAL LOW (ref 59–158)
TIBC: 319 ug/dL (ref 250–450)

## 2023-05-24 LAB — VITAMIN B12 & FOLATE
Folate: 6.6 ng/mL (ref 4.8–24.2)
Vitamin B-12: 509 pg/mL (ref 211–946)

## 2023-05-24 LAB — VITAMIN D 25 HYDROXY: Vit D, 25-Hydroxy: 29.1 ng/mL — ABNORMAL LOW (ref 30.0–100.0)

## 2023-05-24 LAB — TROPONIN: Troponin, High Sensitivity: 14 ng/L — ABNORMAL HIGH (ref 0–11)

## 2023-05-24 LAB — TSH: TSH: 0.77 u[IU]/mL (ref 0.27–4.20)

## 2023-05-24 LAB — T4, FREE: T4 Free: 1.2 ng/dL (ref 0.9–1.7)

## 2023-05-24 MED ORDER — IPRATROPIUM-ALBUTEROL 0.5-2.5 (3) MG/3ML IN SOLN
Freq: Four times a day (QID) | RESPIRATORY_TRACT | Status: DC
Start: 2023-05-24 — End: 2023-05-26
  Administered 2023-05-24 – 2023-05-26 (×7): 1 via RESPIRATORY_TRACT

## 2023-05-24 MED ORDER — BUMETANIDE 1 MG PO TABS
1 MG | Freq: Every day | ORAL | Status: AC
Start: 2023-05-24 — End: 2023-05-24

## 2023-05-24 MED ORDER — POTASSIUM CHLORIDE CRYS ER 10 MEQ PO TBCR
10 MEQ | ORAL | Status: AC
Start: 2023-05-24 — End: 2023-05-26
  Administered 2023-05-26: 02:00:00 10 meq via ORAL

## 2023-05-24 MED ORDER — INSULIN LISPRO 100 UNIT/ML IJ SOLN
100 | Freq: Three times a day (TID) | INTRAMUSCULAR | Status: DC
Start: 2023-05-24 — End: 2023-05-26

## 2023-05-24 MED ORDER — NITROGLYCERIN 0.4 MG SL SUBL
0.4 MG | SUBLINGUAL | Status: AC | PRN
Start: 2023-05-24 — End: 2023-05-26

## 2023-05-24 MED ORDER — ACETAMINOPHEN 325 MG PO TABS
325 | Freq: Four times a day (QID) | ORAL | Status: DC | PRN
Start: 2023-05-24 — End: 2023-05-26

## 2023-05-24 MED ORDER — GLUCAGON HCL (DIAGNOSTIC) 1 MG IJ SOLR
1 | INTRAMUSCULAR | Status: DC | PRN
Start: 2023-05-24 — End: 2023-05-26

## 2023-05-24 MED ORDER — GLUCOSE 4 G PO CHEW
4 | ORAL | Status: DC | PRN
Start: 2023-05-24 — End: 2023-05-26

## 2023-05-24 MED ORDER — METOPROLOL TARTRATE 5 MG/5ML IV SOLN
55 MG/ML | Freq: Four times a day (QID) | INTRAVENOUS | Status: AC | PRN
Start: 2023-05-24 — End: 2023-05-26

## 2023-05-24 MED ORDER — DEXTROSE 10 % IV BOLUS
INTRAVENOUS | Status: DC | PRN
Start: 2023-05-24 — End: 2023-05-26

## 2023-05-24 MED ORDER — BUMETANIDE 0.25 MG/ML IJ SOLN
0.25 | Freq: Two times a day (BID) | INTRAMUSCULAR | Status: DC
Start: 2023-05-24 — End: 2023-05-26
  Administered 2023-05-24 – 2023-05-26 (×5): 2 mg via INTRAVENOUS

## 2023-05-24 MED ORDER — STERILE WATER FOR INJECTION (MIXTURES ONLY)
40 MG | Freq: Three times a day (TID) | INTRAMUSCULAR | Status: AC
Start: 2023-05-24 — End: 2023-05-25
  Administered 2023-05-24 – 2023-05-25 (×5): 40 mg via INTRAVENOUS

## 2023-05-24 MED ORDER — PREDNISONE 5 MG PO TABS
5 MG | Freq: Every day | ORAL | Status: AC
Start: 2023-05-24 — End: 2023-05-25

## 2023-05-24 MED ORDER — HYDROCODONE BIT-HOMATROP MBR 5-1.5 MG/5ML PO SOLN
ORAL | Status: DC | PRN
Start: 2023-05-24 — End: 2023-05-26
  Administered 2023-05-25 – 2023-05-26 (×7): 5 mL via ORAL

## 2023-05-24 MED ORDER — LOSARTAN POTASSIUM 50 MG PO TABS
50 MG | Freq: Every day | ORAL | Status: AC
Start: 2023-05-24 — End: 2023-05-26
  Administered 2023-05-25 – 2023-05-26 (×2): 100 mg via ORAL

## 2023-05-24 MED ORDER — DOXYCYCLINE HYCLATE 100 MG PO CAPS
100 | Freq: Two times a day (BID) | ORAL | Status: DC
Start: 2023-05-24 — End: 2023-05-26
  Administered 2023-05-24 – 2023-05-26 (×5): 100 mg via ORAL

## 2023-05-24 MED ORDER — LEVOFLOXACIN 500 MG PO TABS
500 | Freq: Every day | ORAL | Status: DC
Start: 2023-05-24 — End: 2023-05-26
  Administered 2023-05-24 – 2023-05-26 (×3): 500 mg via ORAL

## 2023-05-24 MED ORDER — MORPHINE SULFATE (PF) 2 MG/ML IV SOLN
2 | INTRAVENOUS | Status: DC | PRN
Start: 2023-05-24 — End: 2023-05-26

## 2023-05-24 MED ORDER — DEXTROSE 10 % IV SOLN
10 | INTRAVENOUS | Status: DC | PRN
Start: 2023-05-24 — End: 2023-05-26

## 2023-05-24 MED ORDER — PANTOPRAZOLE SODIUM 40 MG PO TBEC
40 | Freq: Every day | ORAL | Status: DC
Start: 2023-05-24 — End: 2023-05-23

## 2023-05-24 MED ORDER — TECHNETIUM TC 99M SESTAMIBI IV KIT
Freq: Once | INTRAVENOUS | Status: AC | PRN
Start: 2023-05-24 — End: 2023-05-24
  Administered 2023-05-24: 16:00:00 20 via INTRAVENOUS

## 2023-05-24 MED ORDER — ACETAMINOPHEN 650 MG RE SUPP
650 MG | RECTAL | Status: AC | PRN
Start: 2023-05-24 — End: 2023-05-26

## 2023-05-24 MED ORDER — ARFORMOTEROL TARTRATE 15 MCG/2ML IN NEBU
152 MCG/2ML | Freq: Two times a day (BID) | RESPIRATORY_TRACT | Status: AC
Start: 2023-05-24 — End: 2023-05-26
  Administered 2023-05-24 – 2023-05-26 (×5): 15 ug via RESPIRATORY_TRACT

## 2023-05-24 MED ORDER — INSULIN LISPRO 100 UNIT/ML IJ SOLN
100 | Freq: Every evening | INTRAMUSCULAR | Status: DC
Start: 2023-05-24 — End: 2023-05-26

## 2023-05-24 MED ORDER — IPRATROPIUM-ALBUTEROL 0.5-2.5 (3) MG/3ML IN SOLN
0.5-2.533 (3) MG/3ML | RESPIRATORY_TRACT | Status: AC | PRN
Start: 2023-05-24 — End: 2023-05-24
  Administered 2023-05-24 (×2): 1 via RESPIRATORY_TRACT

## 2023-05-24 MED ORDER — PERFLUTREN LIPID MICROSPHERE IV SUSP
Freq: Once | INTRAVENOUS | Status: AC | PRN
Start: 2023-05-24 — End: 2023-05-24
  Administered 2023-05-24: 15:00:00 1.5 mL via INTRAVENOUS

## 2023-05-24 MED ORDER — BUDESONIDE 0.5 MG/2ML IN SUSP
0.52 MG/2ML | Freq: Two times a day (BID) | RESPIRATORY_TRACT | Status: AC
Start: 2023-05-24 — End: 2023-05-26
  Administered 2023-05-24 – 2023-05-26 (×5): 500 mg via RESPIRATORY_TRACT

## 2023-05-24 MED ORDER — ONDANSETRON HCL 4 MG/2ML IJ SOLN
42 MG/2ML | Freq: Four times a day (QID) | INTRAMUSCULAR | Status: AC | PRN
Start: 2023-05-24 — End: 2023-05-26

## 2023-05-24 MED ORDER — CELECOXIB 100 MG PO CAPS
100 MG | Freq: Every evening | ORAL | Status: AC
Start: 2023-05-24 — End: 2023-05-26
  Administered 2023-05-24 – 2023-05-26 (×3): 200 mg via ORAL

## 2023-05-24 MED ORDER — ATORVASTATIN CALCIUM 20 MG PO TABS
20 MG | Freq: Every evening | ORAL | Status: AC
Start: 2023-05-24 — End: 2023-05-26
  Administered 2023-05-24 – 2023-05-26 (×3): 20 mg via ORAL

## 2023-05-24 MED ORDER — NORMAL SALINE FLUSH 0.9 % IV SOLN
0.9 | INTRAVENOUS | Status: AC
Start: 2023-05-24 — End: 2023-05-24

## 2023-05-24 MED ORDER — PANTOPRAZOLE SODIUM 40 MG PO TBEC
40 MG | Freq: Every day | ORAL | Status: AC
Start: 2023-05-24 — End: 2023-05-26
  Administered 2023-05-25 – 2023-05-26 (×2): 40 mg via ORAL

## 2023-05-24 MED FILL — BROVANA 15 MCG/2ML IN NEBU: 15 MCG/2ML | RESPIRATORY_TRACT | Qty: 2

## 2023-05-24 MED FILL — KLOR-CON M10 10 MEQ PO TBCR: 10 MEQ | ORAL | Qty: 1

## 2023-05-24 MED FILL — DOXYCYCLINE HYCLATE 100 MG PO CAPS: 100 MG | ORAL | Qty: 1

## 2023-05-24 MED FILL — METHYLPREDNISOLONE SODIUM SUCC 40 MG IJ SOLR: 40 MG | INTRAMUSCULAR | Qty: 40

## 2023-05-24 MED FILL — BUMETANIDE 0.25 MG/ML IJ SOLN: 0.25 MG/ML | INTRAMUSCULAR | Qty: 8

## 2023-05-24 MED FILL — ATORVASTATIN CALCIUM 20 MG PO TABS: 20 MG | ORAL | Qty: 1

## 2023-05-24 MED FILL — HUMALOG 100 UNIT/ML IJ SOLN: 100 UNIT/ML | INTRAMUSCULAR | Qty: 300

## 2023-05-24 MED FILL — BUDESONIDE 0.5 MG/2ML IN SUSP: 0.5 MG/2ML | RESPIRATORY_TRACT | Qty: 2

## 2023-05-24 MED FILL — IPRATROPIUM-ALBUTEROL 0.5-2.5 (3) MG/3ML IN SOLN: RESPIRATORY_TRACT | Qty: 3

## 2023-05-24 MED FILL — SODIUM CHLORIDE FLUSH 0.9 % IV SOLN: 0.9 % | INTRAVENOUS | Qty: 10

## 2023-05-24 MED FILL — LEVOFLOXACIN 500 MG PO TABS: 500 MG | ORAL | Qty: 1

## 2023-05-24 MED FILL — BUMETANIDE 1 MG PO TABS: 1 MG | ORAL | Qty: 1

## 2023-05-24 MED FILL — CELECOXIB 100 MG PO CAPS: 100 MG | ORAL | Qty: 2

## 2023-05-24 NOTE — ED Notes (Signed)
Report to 4th floor RN. All questions answered at this time.

## 2023-05-24 NOTE — Progress Notes (Signed)
Attempted to see patient at bedside.  Patient wheezing in the room, and currently taking for an echo exam.  Will attempt at a later time    Resa Miner, DO  Orthopaedic Surgery Resident PGY-2

## 2023-05-24 NOTE — Consults (Signed)
Inpatient Cardiology Consultation      Reason for Consult: Moderate coronary calcifications, persistent fatigue/cough x 1 month    Consulting Physician: Dr. Hyacinth Meeker    Requesting Physician: Lucy Antigua    Date of Consultation: 05/24/2023    HISTORY OF PRESENT ILLNESS:   Jeffrey [name]  is a 77 y.o.  male known to [facility] cardiology.  Last outpatient office visit completed on 08/01/2022 per Dr. Arnette Norris for hospital follow-up of hypertension and acute on chronic congestive heart failure.  Patient was evaluated by Dr.Scrocco on 06/23/2022 for inpatient cardiology consult due to bigeminy, bradycardia, PAT.  Patient lives in [place] 7 months out of the year and also follows with cardiology/EP there.    PMH: atrial fibrillation s/p ablation x 2, Watchman device implant,chronic HFpEF, family history of early onset CAD, GERD, ED, polymyalgia rheumatica and BPH, hypertension and bradycardia. see below    Patient presented to the emergency department for concern of shortness of breath.  She reported increasing shortness of breath and nonproductive cough for the past 3 days.  Patient was seen by his PCP prescribed Bumex.  Patient states he has chronic lower extremity edema.  ED > 05/23/2023, 1522, walk-in, sent in by PCP, thinks patient may have CHF complaint of shortness of breath, fluid in the lungs and cough  Arrival vitals: T97.9, RR 18, P79, BP 188/84, SpO2 97% on room air.  Significant Labs: NA 141, K3.8, BUN 15, CR 0.8, GFR greater than 90, glucose 177, normal GI profile, TSH 0.77 T4 free 1.2, WBC 7.3, Hgb 13, HCT 38, PLT 193.  BNP 265 (prior results :393/228/106)  Troponin 16/16/14  cholesterol 126, HDL 46, LDL 70, triglycerides 71  Negative testing for Influenza and COVID  RAD:   CTA PULMONARY W CONTRAST  Final Result  1. No evidence of pulmonary embolism or acute pulmonary abnormality.  2. Moderate coronary calcification.  3. Small hiatal hernia.       XR CHEST 1 VIEW  Final Result  Chronic findings without  appreciable acute process.    ED treatment: Brovana 15 mcg nebulized x 2, Lipitor 20 mg oral, Pulmicort 500 mcg nebulized x 2, Celebrex 200 mg oral, DuoNeb x 2, Solu-Medrol 40 mg IV.  ED diagnosis: Fatigue, dyspnea, CAD  Echocardiogram ordered and pending per primary service    Patient seen and examined.  Recent vitals: T98, RR 18, P55, BP 160/85, SpO2 96% on room air.  On arrival to his room, he was ambulating in the room getting ready to be transferred downstairs for echocardiogram.  Patient is alert and oriented x 3 in no apparent distress.  VSS.  Patient has nonproductive palliative dry cough with mild audible wheezing and inspiration during assessment.  He denies any cardiac complaints such as chest pain or palpitations but he does report ongoing shortness of breath.  Patient reports that his symptoms started suddenly around 3 days ago when he woke in the a.m. he had a sore throat for around 2 days, with fatigue, developed chronic coughing that has become somewhat productive with white mucus, feeling flulike and wheezing with progressive shortness of breath.  He reports that he went to see his PCP the doctor was concern for fluid overload and sent him to the emergency department for further evaluation.  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  Denies any lung disease, reports DOE with 1 flight  of stairs, that he reports could be slightly progressive but he relates this to his age, he denies any weight gain, abdominal distention, PND, or orthopnea.  He does report chronic ongoing lower extremity swelling that he takes diuretics for at home.  He reports compliance with his dental mouthpiece nightly for sleeping and used to wear a CPAP.  Although when discussing his activity level he reports METs greater than 7.Denies current fever/chills. Denies n/v/d. Denies exertional chest pain or shortness of breath  prior to this illness. Denies hx of MI, stroke/tia, or clots.    Please note: past medical records were reviewed per electronic medical record (EMR) - see detailed reports under Past Medical/ Surgical History.   Past Medical History:     Chronic diastolic congestive heart failure/HFpEF LVEF 60 to 65%  Grade 1 diastolic dysfunction  PAF, prior OAC with Eliquis   S/p AF Ablation x 2 (both in [facility], first procedure "a couple of years ago"; second within the past year)  S/p Watchman procedure in [place] 06/09/2022   Watchman procedure in June 2023  Hypertension        HLD on statin therapy      Family history of ischemic heart disease and other diseases of the circulatory system :   Father died of massive heart attack at age 70   GERD  ED on Cialis  BPH  Polymyalgia rheumatica  Obesity, BMI 36.2  OSA          Prior cardiac testing:  ECHO: TEE ([place] [facility] at time of Watchman, 06/09/2022)  Findings discussed with Dr. Lysle Morales. L.V.: The visually estimated EF is 60-65%. Left ventricular diastolic function is grade I dysfunction. RWMA: There are no regional wall motion abnormalities. R.V.: Normal right ventricle size and function. L.A.: The left atrium is normal. There is no thrombus identified in the left atrial appendage. R.A.: The right atrium is normal. I.A.S.: The interatrial septum appears normal. The interatrial septum appears intact (no ASD or shunt). I.V.S.: Interventricular septum morphology: moderately hypertrophied. Pericardium.: The pericardium is normal. Pleural.: There is no pleural effusion. Ao.: The descending aorta thickness suggests intimal thickening. A.V.: The aortic valve is trileaflet, structurally normal, without evidence of stenosis or regurgitation. M.V.: The mitral valve is structurally normal, without evidence of stenosis with mild regurgitation. T.V.: The tricuspid valve is structurally normal with no evidence of regurgitation or stenosis. P.V.: The pulmonic valve is structured normally  with no evidence of stenosis with trace regurgitation.     07/24/2021  Summary  No previous echo for comparison. Technically adequate study.  Left ventricle size is normal.  Mild concentric left ventricular hypertrophy.  Ejection fraction is visually estimated at 55%.  No regional wall motion abnormalities seen.  There is doppler evidence of stage II diastolic dysfunction.  Physiologic and/or trace tricuspid regurgitation.  RVSP is 37 mmHg.  Pulmonary hypertension is mild.     09/05/19 Summary   Technically difficult examination. Enhancing contrast was used to define   endocardial borders.   Normal left ventricular chamber size.   Normal left ventricular systolic function.   Visually estimated LVEF is 60-65 %.   No wall motion abnormalities.   Normal right ventricle structure and function.   No significant valvular abnormalities.   No comparison study available.     Stress Test: Cardiac Testing:  EKG - (06/22/2017) Sinus bradycardia.  Normal ECG.  Echocardiogram - (06/26/2017) Normal left ventricular systolic function with stage I diastolic dysfunction.  Trace mitral valve regurgitation.  Trace tricuspid valve regurgitation with RVSP of 30 mmHg.  Stress Test - (07/05/2017) Negative Lexiscan stress test for ischemic symptoms or  ischemic EKG changes.  Normal Cardiolite perfusion scan without evidence of fixed or reversible defect.  Normal left ventricular systolic function with normal wall motion.  There is no old comparison study.  The results of the study predict low probability for significant coronary artery disease or future cardiac events.    Past Surgical History:    Past Surgical History:   Procedure Laterality Date    APPENDECTOMY      CARDIAC SURGERY      watchman implant    COLONOSCOPY      HERNIA REPAIR         Medications Prior to admit:  Prior to Admission medications    Medication Sig Start Date End Date Taking? Authorizing Provider   celecoxib (CELEBREX) 200 MG capsule Take 1 capsule by mouth nightly   Yes  [provider]   potassium chloride (KLOR-CON M) 10 MEQ extended release tablet Take 1 tablet by mouth three times a week Sunday, Tuesday, Thursday   Yes [provider]   predniSONE (DELTASONE) 5 MG tablet Take 1 tablet by mouth daily   Yes [provider]   atorvastatin (LIPITOR) 20 MG tablet Take 1 tablet by mouth nightly 05/15/23   Ragheb, Essam S, MD   bumetanide (BUMEX) 1 MG tablet Take 1 tablet by mouth daily 05/15/23 08/13/23  Ragheb, Essam S, MD   losartan (COZAAR) 100 MG tablet Take 1 tablet by mouth daily 05/15/23   Ragheb, Essam S, MD   omeprazole (PRILOSEC) 20 MG delayed release capsule Take 1 capsule by mouth daily 05/15/23   Ragheb, Essam S, MD       Current Medications:    Current Facility-Administered Medications: sodium chloride flush 0.9 % injection, , ,   insulin lispro (HUMALOG,ADMELOG) injection vial 0-8 Units, 0-8 Units, SubCUTAneous, TID WC  insulin lispro (HUMALOG,ADMELOG) injection vial 0-4 Units, 0-4 Units, SubCUTAneous, Nightly  glucose chewable tablet 16 g, 4 tablet, Oral, PRN  dextrose bolus 10% 125 mL, 125 mL, IntraVENous, PRN **OR** dextrose bolus 10% 250 mL, 250 mL, IntraVENous, PRN  glucagon injection 1 mg, 1 mg, SubCUTAneous, PRN  dextrose 10 % infusion, , IntraVENous, Continuous PRN  acetaminophen (TYLENOL) tablet 650 mg, 650 mg, Oral, Q6H PRN  acetaminophen (TYLENOL) suppository 650 mg, 650 mg, Rectal, Q4H PRN  ondansetron (ZOFRAN) injection 4 mg, 4 mg, IntraVENous, Q6H PRN  metoprolol (LOPRESSOR) injection 5 mg, 5 mg, IntraVENous, Q6H PRN  nitroGLYCERIN (NITROSTAT) SL tablet 0.4 mg, 0.4 mg, SubLINGual, Q5 Min PRN  morphine (PF) injection 2 mg, 2 mg, IntraVENous, Q4H PRN  perflutren lipid microspheres (DEFINITY) injection 1.5 mL, 1.5 mL, IntraVENous, ONCE PRN  ipratropium 0.5 mg-albuterol 2.5 mg (DUONEB) nebulizer solution 1 Dose, 1 Dose, Inhalation, Q4H PRN  budesonide (PULMICORT) nebulizer suspension 500 mcg, 0.5 mg, Nebulization, BID RT  arformoterol  tartrate (BROVANA) nebulizer solution 15 mcg, 15 mcg, Nebulization, BID RT  methylPREDNISolone sodium succ (SOLU-MEDROL) 40 mg in sterile water 1 mL injection, 40 mg, IntraVENous, Q8H  atorvastatin (LIPITOR) tablet 20 mg, 20 mg, Oral, Nightly  bumetanide (BUMEX) tablet 1 mg, 1 mg, Oral, Daily  celecoxib (CELEBREX) capsule 200 mg, 200 mg, Oral, Nightly  losartan (COZAAR) tablet 100 mg, 100 mg, Oral, Daily  potassium chloride (KLOR-CON M) extended release tablet 10 mEq, 10 mEq, Oral, Once per day on Mon Wed Fri  [Held by provider] predniSONE (DELTASONE)  tablet 5 mg, 5 mg, Oral, Daily  pantoprazole (PROTONIX) tablet 40 mg, 40 mg, Oral, QAM AC    Allergies:  Lidocaine, Lidocaine-epinephrine, Cephalexin, and Sulfa antibiotics    Social History:    Tobacco use /vaping: Denies  Alcohol: Reports drinking 1-2 vodka drinks 3-4 times per week.  Marijuana: Denies  Illicit: Denies    Independent with his wife, does not use any assistive devices, active, working part-time as a Nutritional therapist.    Full Code        Social History     Socioeconomic History    Marital status: Married     Spouse name: Not on file    Number of children: 1    Years of education: Not on file    Highest education level: Not on file   Occupational History    Not on file   Tobacco Use    Smoking status: Never    Smokeless tobacco: Never   Vaping Use    Vaping Use: Never used   Substance and Sexual Activity    Alcohol use: Yes     Alcohol/week: 2.0 standard drinks of alcohol     Types: 2 Shots of liquor per week     Comment: 2 drinks vodka a day     Drug use: Never    Sexual activity: Yes   Other Topics Concern    Not on file   Social History Narrative    Not on file     Social Determinants of Health     Financial Resource Strain: Low Risk  (06/01/2022)    Overall Financial Resource Strain (CARDIA)     Difficulty of Paying Living Expenses: Not hard at all   Food Insecurity: No Food Insecurity (05/24/2023)    Hunger Vital Sign     Worried About Running Out of Food in  the Last Year: Never true     Ran Out of Food in the Last Year: Never true   Transportation Needs: No Transportation Needs (05/24/2023)    PRAPARE - Therapist, art (Medical): No     Lack of Transportation (Non-Medical): No   Physical Activity: Inactive (04/30/2023)    Exercise Vital Sign     Days of Exercise per Week: 0 days     Minutes of Exercise per Session: 0 min   Stress: Not on file   Social Connections: Not on file   Intimate Partner Violence: Not on file   Housing Stability: Low Risk  (05/24/2023)    Housing Stability Vital Sign     Unable to Pay for Housing in the Last Year: No     Number of Places Lived in the Last Year: 1     Unstable Housing in the Last Year: No       Family History:   Family History   Problem Relation Age of Onset    Heart Attack Father          REVIEW OF SYSTEMS:     See HPI     PHYSICAL EXAM:   BP (!) 160/85   Pulse 55   Temp 98 F (36.7 C) (Oral)   Resp 18   Ht 1.702 m (5\' 7" )   Wt 104.8 kg (231 lb 1.6 oz)   SpO2 96%   BMI 36.20 kg/m   CONST:  Well developed, well nourished obese Caucasian male, who appears stated age. Awake, alert, cooperative, no apparent distress  HEENT:   Head- Normocephalic, atraumatic  Eyes- Conjunctivae pink  Throat- Oral mucosa pink and moist  Neck-thick, no stridor, trachea midline, no jugular venous distention/difficult to assess. No hepatojugular reflex  CHEST: Chest symmetrical and non-tender to palpation. No accessory muscle use or intercostal retractions  RESPIRATORY: Lung sounds -wheezing noted anterior and posterior.  No crackles or rhonchi.  Nonproductive cough during exam with mild slightly audible wheezing on inspiration.  CARDIOVASCULAR:     No carotid bruit  Heart Inspection- shows no noted pulsations  Heart Palpation- no heaves or thrills  Heart Ausculation-distant heart sounds, regular rate and rhythm, no murmur. No s3, s4 or rub   PV: Chronic 1-2+ pitting lower extremity edema. No varicosities. Pedal pulses  palpable  ABDOMEN: Soft, obese non-tender to light palpation. Bowel sounds present.   MS: Good muscle strength and tone. No atrophy or abnormal movements.   GU: Deferred  SKIN: Warm and dry    NEURO / PSYCH: Oriented to person, place and time. Speech clear and appropriate. Follows all commands. Pleasant affect     DATA:    ECG:    Tele strips:   Normal sinus rhythm 70 bpm occasional PVCs.  Diagnostic:  See above    Orthostatic Vitals:       No data to display                Wt Readings from Last 3 Encounters:   05/24/23 104.8 kg (231 lb 1.6 oz)   05/15/23 103.7 kg (228 lb 11.2 oz)   05/10/23 100.7 kg (222 lb)       No intake or output data in the 24 hours ending 05/24/23 0841    Labs:   CBC:   Recent Labs     05/23/23  1636 05/24/23  0507   WBC 8.0 7.3   HGB 13.8 13.0   HCT 41.7 38.0   PLT 208 193     BMP:   Recent Labs     05/23/23  1636 05/24/23  0507   NA 143 141   K 4.1 3.8   CO2 28 25   BUN 12 15   CREATININE 0.8 0.8   LABGLOM >90 >90   CALCIUM 8.6 8.6     Mag: No results for input(s): "MG" in the last 72 hours.  Phos: No results for input(s): "PHOS" in the last 72 hours.  TSH:   Lab Results   Component Value Date    TSH 0.77 05/24/2023       HgA1c:   Lab Results   Component Value Date    LABA1C 5.3 09/05/2019       PRO-BNP:   Recent Labs     05/23/23  1636   PROBNP 265     PT/INR: No results for input(s): "PROTIME", "INR" in the last 72 hours.  APTT:No results for input(s): "APTT" in the last 72 hours.  HS TROP:  Lab Results   Component Value Date/Time    TROPHS 14 05/24/2023 05:07 AM    TROPHS 16 05/23/2023 05:44 PM    TROPHS 16 05/23/2023 04:36 PM    TROPHS 17 06/23/2022 10:35 AM    TROPHS 17 10/04/2021 10:25 PM    TROPHS 17 10/04/2021 09:17 PM     LIPID PANEL:  Recent Labs     05/24/23  0507   CHOL 126   HDL 46   TRIG 51   VLDL 10     LIVER PROFILE:  Recent Labs     05/23/23  1636 05/24/23  0507   AST 19 16   ALT 16 14     Assessment and plan per Dr. Ledell Noss, APRN - CNP  [facility]      Electronically signed by Baxter Flattery, APRN - CNP on 05/24/2023 at 8:41 AM     CARDIOLOGY ATTENDING ATTESTATION   I spent > 51% of the total time involved with completing the encounter. The total time included the following:  Independently interviewing the patient (HPI, ROS, PMH, PSH, FMH, SH, allergies, and medications)  Independently performing a medically appropriate examination  Reviewing the above documentation completed by the APP  Ordering medications, tests, and/or procedures  Formulating the assessment/plan and reviewing the rationale for the above recommendations  Reviewing available records, results of all previously ordered testing/procedures, and current problem list  Counseling/educating the patient  Coordinating care with other healthcare professionals  Communicating results to the patient's family/caregiver  Documenting clinical information in the patient's electronic health record    Physical Exam   BP (!) 160/85   Pulse 55   Temp 98 F (36.7 C) (Oral)   Resp 18   Ht 1.702 m (5\' 7" )   Wt 104.8 kg (231 lb)   SpO2 96%   BMI 36.18 kg/m   Constitutional: Oriented to person, place, and time. Well-developed and well-nourished. No distress.    Head: Normocephalic and atraumatic.   Eyes: EOM are normal. Pupils are equal, round, and reactive to light.   Neck: Normal range of motion. Neck supple. No hepatojugular reflux and no JVD present. Carotid bruit is not present. No tracheal deviation present. No thyromegaly present.   Cardiovascular: Normal rate, regular rhythm, normal heart sounds and intact distal pulses.  Exam reveals no gallop and no friction rub.  No murmur heard.  Pulmonary/Chest: Effort normal and breath sounds normal. No respiratory distress. No wheezes. No rales. No tenderness.   Abdominal: Soft. Bowel sounds are normal. No distension and no mass. No tenderness. No rebound and no guarding.   Musculoskeletal: Normal range of motion. No edema and no tenderness.   Lymphadenopathy:    No cervical adenopathy.   Neurological: Alert and oriented to person, place, and time.   Skin: Skin is warm and dry. No rash noted. Not diaphoretic. No erythema.   Psychiatric: Normal mood and affect. Behavior is normal.     Stress Test: Cardiac Testing:  EKG - (06/22/2017) Sinus bradycardia.  Normal ECG.  Echocardiogram - (06/26/2017) Normal left ventricular systolic function with stage I diastolic dysfunction.  Trace mitral valve regurgitation.  Trace tricuspid valve regurgitation with RVSP of 30 mmHg.  Stress Test - (07/05/2017) Negative Lexiscan stress test for ischemic symptoms or  ischemic EKG changes.  Normal Cardiolite perfusion scan without evidence of fixed or reversible defect.  Normal left ventricular systolic function with normal wall motion.  There is no old comparison study.  The results of the study predict low probability for significant coronary artery disease or future cardiac events.    Echo Summary 09/05/19:   Technically difficult examination. Enhancing contrast was used to define endocardial borders.   Normal left ventricular chamber size.   Normal left ventricular systolic function.   Visually estimated LVEF is 60-65 %.   No wall motion abnormalities.   Normal right ventricle structure and function.   No significant valvular abnormalities.   No comparison study available.    Echo Summary 07/24/2021:  No previous echo for comparison. Technically adequate study.  Left ventricle size is normal.  Mild concentric left ventricular hypertrophy.  Ejection fraction is visually estimated at 55%.  No regional wall motion abnormalities seen.  There is doppler evidence of stage II diastolic dysfunction.  Physiologic and/or trace tricuspid regurgitation.  RVSP is 37 mmHg.  Pulmonary hypertension is mild.    ECHO: TEE [facility] at time of Watchman, 06/09/2022)  Findings discussed with Dr. Lysle Morales. L.V.: The visually estimated EF is 60-65%. Left ventricular diastolic function is grade I dysfunction. RWMA: There  are no regional wall motion abnormalities. R.V.: Normal right ventricle size and function. L.A.: The left atrium is normal. There is no thrombus identified in the left atrial appendage. R.A.: The right atrium is normal. I.A.S.: The interatrial septum appears normal. The interatrial septum appears intact (no ASD or shunt). I.V.S.: Interventricular septum morphology: moderately hypertrophied. Pericardium.: The pericardium is normal. Pleural.: There is no pleural effusion. Ao.: The descending aorta thickness suggests intimal thickening. A.V.: The aortic valve is trileaflet, structurally normal, without evidence of stenosis or regurgitation. M.V.: The mitral valve is structurally normal, without evidence of stenosis with mild regurgitation. T.V.: The tricuspid valve is structurally normal with no evidence of regurgitation or stenosis. P.V.: The pulmonic valve is structured normally with no evidence of stenosis with trace regurgitation.     See accompanying documentation for full consult.    ASSESSMENT AND PLAN:  Patient Active Problem List   Diagnosis    Primary hypertension    Diverticular disease    Gastroesophageal reflux disease    Acute on chronic congestive heart failure, unspecified heart failure type (HCC)    Ventricular bigeminy    PAF (paroxysmal atrial fibrillation) (HCC)    PAT (paroxysmal atrial tachycardia) (HCC)    Chronic heart failure with preserved ejection fraction (HFpEF) (HCC)    Carpal tunnel syndrome, left    Acute on chronic combined systolic and diastolic CHF (congestive heart failure) (HCC)     1. Coronary Calcifications:    Chart/labs/EKG/monitor reviewed.     Echo ordered.     Pharm stress in am if viral panel is okay.    Statin. Add ASA/BB as indicated.    2. Chronic diastolic CHF:     Echo ordered.     Observe volume.    3. PAF:     Hx AF Ablation x 2 (both in [facility], first procedure "a couple of years ago"; second within the past year).    S/p Watchman procedure in [facility]  06/09/2022.    In sinus.     Monitor.     4. Fatigue/Cough: Viral panel. Per primary service.    5. HTN: Observe.     6. Lipids: Statin.     7. PMR: On prednisone.    8. OSA    9. 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   Available imaging and evaluations independently reviewed.   Interviewed and discussed patient with available family.  Discussed case with referring service and non-cardiology consultants.     Jory Sims, D.O.  Cardiologist  Cardiology, [facility]

## 2023-05-24 NOTE — ED Notes (Signed)
This RN assumed care of pt.

## 2023-05-24 NOTE — Progress Notes (Signed)
Internal Medicine Consult Note    [facility]    Jonelle Sports. Saintclair Schroader, D.O., F.A.C.O.I.                    Talitha Givens, D.O., F.A.C.O.I.                             Hoy Register, D.O.     Franchot Heidelberg, MSN, APRN, NP-C  Lamonte Richer. Earlene Plater, MSN, APRN-CNP  Urbano Heir, MSN, APRN-CNP  Vilinda Boehringer, MSN APRN-CNP  Lucy Antigua, MSN. APRN-NP-C     Primary Care Physician: [name], MD   Admitting Physician:  Alvin Critchley, DO  Admission date and time: 05/23/2023  4:15 PM    Room:  0436/0436-01  Admitting diagnosis: Acute on chronic combined systolic and diastolic congestive heart failure (HCC) [I50.43]  Acute on chronic combined systolic and diastolic CHF (congestive heart failure) (HCC) [I50.43]  Coronary artery calcification [I25.10, I25.84]  Other fatigue [R53.83]  Dyspnea, unspecified type [R06.00]    Patient Name: Jeffrey [name]  MRN: [id number]    Date of Service: 05/24/2023     Subjective:  Jeffrey [name] is a 77 y.o. male who was seen and examined today,05/24/2023, at the bedside.  Patient was seen and examined in the emergency department.  He is sitting up in bed with a irritating chronic cough, harsh in nature.  He does denies any shortness of breath but does state that he is extremely wheezy.  He does not require the use of any oxygen and is 96% on room air.    No family present during my examination.    Review of System:   Constitutional:   Denies fever or chills, weight loss or gain, positive fatigue.  HEENT:   Denies ear pain, sore throat, sinus or eye problems.  Cardiovascular:   Denies any chest pain, irregular heartbeats, or palpitations.   Respiratory:   Denies shortness of breath sputum production, hemoptysis, reports persistent cough and wheezing  Gastrointestinal:   Denies nausea, vomiting, diarrhea, or constipation.  Denies any abdominal pain.  Genitourinary:    Denies any urgency, frequency, hematuria. Voiding  without difficulty.  Extremities:   Denies lower extremity swelling,  edema or cyanosis.   Neurology:    Denies any headache or focal neurological deficits, Denies generalized weakness or memory difficulty.   Psch:   Denies being anxious or depressed.  Musculoskeletal:    Denies  myalgias, joint complaints or back pain.   Integumentary:   Denies any rashes, ulcers, or excoriations.  Denies bruising.  Hematologic/Lymphatic:  Denies bruising or bleeding.    Physical Exam:  No intake/output data recorded.  No intake or output data in the 24 hours ending 05/24/23 1837No intake/output data recorded.  Patient Vitals for the past 96 hrs (Last 3 readings):   Weight   05/24/23 1057 104.8 kg (231 lb)   05/24/23 1019 104.8 kg (231 lb)   05/24/23 0742 104.8 kg (231 lb 1.6 oz)     Vital Signs:   Blood pressure 136/65, pulse 77, temperature 98 F (36.7 C), temperature source Oral, resp. rate 19, height 1.702 m (5\' 7" ), weight 104.8 kg (231 lb), SpO2 96 %.    General appearance:  Alert, responsive, oriented to person, place, and time.  Ill-appearing   head:  Normocephalic. No masses, lesions or tenderness.  Eyes:  PERRLA.  EOMI.  Sclera clear.  Buccal mucosa moist.  ENT:  Ears normal. Mucosa normal.  Neck:    Supple. Trachea midline. No thyromegaly. No JVD. No bruits.  Heart:    Rhythm regular. Rate controlled.  No murmurs.  Lungs:    Symmetrical.  Diminished bilaterally with expiratory wheezing. No rhonchi. No rales.  Abdomen:   Soft. Non-tender. Non-distended. Bowel sounds positive. No organomegaly or masses.  No pain on palpation.  Extremities:    Peripheral pulses present.  No peripheral edema.  No ulcers. No cyanosis. No clubbing.  Neurologic:    Alert x 3.  No focal deficit.  Cranial nerves grossly intact. No focal weakness.  Psych:   Behavior is normal. Mood appears normal. Speech is not rapid and/or pressured.  Musculoskeletal:   Spine ROM normal. Muscular strength intact. Gait not assessed.  Integumentary:  No rashes  Skin normal color and  texture.  Genitalia/Breast:  Deferred    Medication:  Scheduled Meds:   insulin lispro  0-8 Units SubCUTAneous TID WC    insulin lispro  0-4 Units SubCUTAneous Nightly    bumetanide  2 mg IntraVENous BID    ipratropium 0.5 mg-albuterol 2.5 mg  1 Dose Inhalation 4x Daily RT    doxycycline hyclate  100 mg Oral 2 times per day    levoFLOXacin  500 mg Oral Daily    budesonide  0.5 mg Nebulization BID RT    arformoterol tartrate  15 mcg Nebulization BID RT    methylPREDNISolone  40 mg IntraVENous Q8H    atorvastatin  20 mg Oral Nightly    celecoxib  200 mg Oral Nightly    losartan  100 mg Oral Daily    potassium chloride  10 mEq Oral Once per day on Mon Wed Fri    [Held by provider] predniSONE  5 mg Oral Daily    pantoprazole  40 mg Oral QAM AC     Continuous Infusions:   dextrose         Objective Data:  Recent Labs     05/23/23  0817 05/23/23  1636 05/24/23  0507   WBC 8.1 8.0 7.3   RBC 4.67 4.76 4.40   HGB 13.4 13.8 13.0   HCT 40.8 41.7 38.0   MCV 87.4 87.6 86.4   MCH 28.7 29.0 29.5   MCHC 32.8 33.1 34.2   RDW 14.1 14.1 14.1   PLT 197 208 193   MPV 9.7 9.9 9.8     Recent Labs     05/22/23  0822 05/23/23  1636 05/24/23  0507   NA 143 143 141   K 4.1 4.1 3.8   CL 103 104 105   CO2 18* 28 25   BUN 25* 12 15   CREATININE 0.9 0.8 0.8   GLUCOSE 79 92 177*   CALCIUM 8.9 8.6 8.6   BILITOT 0.3 0.3 0.3   ALKPHOS 74 84 79   AST 19 19 16    ALT 15 16 14    ALBUMIN 4.4 4.1 3.8     Lab Results   Component Value Date    TROPONINI <0.01 09/05/2019    TROPONINI <0.01 09/04/2019    TROPONINI <0.01 09/04/2019          Wound Documentation:   Wound 05/05/21 Leg Left;Posterior new #1 left posterior leg (Active)   Number of days: 749       Assessment:  Acute exacerbation of CHF on home regimen  Elevated troponin no delta change likely due to demand ischemia  Hyperlipidemia on statin  GERD on  PPI  Essential hypertension on ARB  Carpal tunnel syndrome left wrist    Plan:   Start Brovana, Pulmicort, and DuoNeb aerosol treatments  IV  steroids  Hycodan cough syrup  Respiratory molecular panel  Check respiratory culture  Consult cardiology  Diuretic therapy with Bumex  Antibiotic therapy with doxycycline  Humalog insulin for glycemic control    Continue current therapy.  See orders for further plan of care.    More than 50% of my  time was spent at the bedside counseling/coordinating care with the patient and/or family with face to face contact.  This time was spent reviewing notes and laboratory data as well as instructing and counseling the patient. Time I spent with the family or surrogate(s) is included only if the patient was incapable of providing the necessary information or participating in medical decisions. I also discussed the differential diagnosis and all of the proposed management plans with the patient and individuals accompanying the patient.    The patient was seen, examined and then discussed with Dr. Harlin Rain.     Urbano Heir, APRN - CNP  05/24/2023  6:37 PM        I saw and evaluated the patient. I agree with the findings and the plan of care as documented in Urbano Heir APRN-CNP note.    Alvin Critchley, DO, FACOI  6:45 PM  05/24/2023

## 2023-05-24 NOTE — Progress Notes (Signed)
Patient seen and examined at bedside.  Patient was supposed to undergo a left carpal tunnel release tomorrow 5/31 with Dr. Joseph Art.  He presented to the emergency department due to shortness of breath and symptoms consistent with an acute congestive heart failure.  Denies any trauma denies any fall.  Stated that left carpal tunnel symptoms have improved since admission.  Physical examination is unremarkable in bilateral upper and lower extremities.  No obvious sign of traumatic event noted.  Bilateral lower extremities mildly swollen, however compartments and neurovascular status are all pristine.  No orthopedic intervention planned at this time.  Will continue to follow peripherally during hospital stay.  Please call if any question or concern.  Patient will follow-up with Dr. Joseph Art upon discharge

## 2023-05-25 ENCOUNTER — Inpatient Hospital Stay: Admit: 2023-05-25 | Payer: MEDICARE | Primary: Internal Medicine

## 2023-05-25 ENCOUNTER — Inpatient Hospital Stay: Payer: Medicare (Managed Care)

## 2023-05-25 LAB — NM STRESS TEST WITH MYOCARDIAL PERFUSION
Baseline Diastolic BP: 76 mmHg
Baseline HR: 83 {beats}/min
Baseline O2 Sat: 95 %
Baseline Systolic BP: 122 mmHg
Body Surface Area: 2.23 m2
Nuc Stress EF: 54 %
Recovery Stage 1 Duration: 1 min:sec
Recovery Stage 1 HR: 100 {beats}/min
Recovery Stage 2 Duration: 1 min:sec
Recovery Stage 2 HR: 99 {beats}/min
Recovery Stage 3 Duration: 1 min:sec
Recovery Stage 3 HR: 93 {beats}/min
Recovery Stage 4 Duration: 1 min:sec
Recovery Stage 4 HR: 90 {beats}/min
Stress Stage 1 Duration: 1 min:sec
Stress Stage 1 HR: 108 {beats}/min
Stress Target HR: 143 {beats}/min

## 2023-05-25 LAB — CBC WITH AUTO DIFFERENTIAL
Basophils %: 0 % (ref 0.0–2.0)
Basophils Absolute: 0.04 10*3/uL (ref 0.00–0.20)
Eosinophils %: 0 % (ref 0–6)
Eosinophils Absolute: 0 10*3/uL — ABNORMAL LOW (ref 0.05–0.50)
Hematocrit: 42.9 % (ref 37.0–54.0)
Hemoglobin: 13.9 g/dL (ref 12.5–16.5)
Immature Granulocytes %: 1 % (ref 0.0–5.0)
Immature Granulocytes Absolute: 0.1 10*3/uL (ref 0.00–0.58)
Lymphocytes %: 4 % — ABNORMAL LOW (ref 20.0–42.0)
Lymphocytes Absolute: 0.61 10*3/uL — ABNORMAL LOW (ref 1.50–4.00)
MCH: 28.3 pg (ref 26.0–35.0)
MCHC: 32.4 g/dL (ref 32.0–34.5)
MCV: 87.4 fL (ref 80.0–99.9)
MPV: 10.1 fL (ref 7.0–12.0)
Monocytes %: 3 % (ref 2.0–12.0)
Monocytes Absolute: 0.57 10*3/uL (ref 0.10–0.95)
Neutrophils %: 92 % — ABNORMAL HIGH (ref 43.0–80.0)
Neutrophils Absolute: 15.3 10*3/uL — ABNORMAL HIGH (ref 1.80–7.30)
Platelets: 225 10*3/uL (ref 130–450)
RBC: 4.91 m/uL (ref 3.80–5.80)
RDW: 14.4 % (ref 11.5–15.0)
WBC: 16.6 10*3/uL — ABNORMAL HIGH (ref 4.5–11.5)

## 2023-05-25 LAB — CULTURE, RESPIRATORY: Culture: NORMAL

## 2023-05-25 LAB — EKG 12-LEAD
Atrial Rate: 83 {beats}/min
P Axis: 67 degrees
P-R Interval: 138 ms
Q-T Interval: 422 ms
QRS Duration: 140 ms
QTc Calculation (Bazett): 495 ms
R Axis: -49 degrees
T Axis: -24 degrees
Ventricular Rate: 83 {beats}/min

## 2023-05-25 LAB — COMPREHENSIVE METABOLIC PANEL W/ REFLEX TO MG FOR LOW K
ALT: 16 U/L (ref 0–40)
AST: 27 U/L (ref 0–39)
Albumin: 4.2 g/dL (ref 3.5–5.2)
Alkaline Phosphatase: 76 U/L (ref 40–129)
Anion Gap: 14 mmol/L (ref 7–16)
BUN: 24 mg/dL — ABNORMAL HIGH (ref 6–23)
CO2: 26 mmol/L (ref 22–29)
Calcium: 9.2 mg/dL (ref 8.6–10.2)
Chloride: 100 mmol/L (ref 98–107)
Creatinine: 0.9 mg/dL (ref 0.70–1.20)
Est, Glom Filt Rate: 89 mL/min/{1.73_m2} (ref >60)
Glucose: 125 mg/dL — ABNORMAL HIGH (ref 74–99)
Potassium: 4 mmol/L (ref 3.5–5.0)
Sodium: 140 mmol/L (ref 132–146)
Total Bilirubin: 0.4 mg/dL (ref 0.0–1.2)
Total Protein: 7.5 g/dL (ref 6.4–8.3)

## 2023-05-25 LAB — POCT GLUCOSE
POC Glucose: 107 mg/dL — ABNORMAL HIGH (ref 74–99)
POC Glucose: 132 mg/dL — ABNORMAL HIGH (ref 74–99)
POC Glucose: 127 mg/dL — ABNORMAL HIGH (ref 74–99)
POC Glucose: 143 mg/dL — ABNORMAL HIGH (ref 74–99)

## 2023-05-25 MED ORDER — PREDNISONE 20 MG PO TABS
20 | Freq: Every day | ORAL | Status: DC
Start: 2023-05-25 — End: 2023-05-26
  Administered 2023-05-26: 13:00:00 20 mg via ORAL

## 2023-05-25 MED ORDER — REGADENOSON 0.4 MG/5ML IV SOLN
0.4 | Freq: Once | INTRAVENOUS | Status: AC | PRN
Start: 2023-05-25 — End: 2023-05-25
  Administered 2023-05-25: 15:00:00 0.4 mg via INTRAVENOUS

## 2023-05-25 MED ORDER — TECHNETIUM TC 99M SESTAMIBI IV KIT
Freq: Once | INTRAVENOUS | Status: AC | PRN
Start: 2023-05-25 — End: 2023-05-25
  Administered 2023-05-25: 16:00:00 30 via INTRAVENOUS

## 2023-05-25 MED ORDER — FLUTICASONE PROPIONATE 50 MCG/ACT NA SUSP
50 | Freq: Every day | NASAL | Status: DC
Start: 2023-05-25 — End: 2023-05-26
  Administered 2023-05-26 (×2): 2 via NASAL

## 2023-05-25 MED FILL — BUMETANIDE 0.25 MG/ML IJ SOLN: 0.25 MG/ML | INTRAMUSCULAR | Qty: 8

## 2023-05-25 MED FILL — METHYLPREDNISOLONE SODIUM SUCC 40 MG IJ SOLR: 40 MG | INTRAMUSCULAR | Qty: 40

## 2023-05-25 MED FILL — DOXYCYCLINE HYCLATE 100 MG PO CAPS: 100 MG | ORAL | Qty: 1

## 2023-05-25 MED FILL — ATORVASTATIN CALCIUM 20 MG PO TABS: 20 MG | ORAL | Qty: 1

## 2023-05-25 MED FILL — HYDROCODONE BIT-HOMATROP MBR 5-1.5 MG/5ML PO SOLN: ORAL | Qty: 5

## 2023-05-25 MED FILL — REGADENOSON 0.4 MG/5ML IV SOLN: 0.4 MG/5ML | INTRAVENOUS | Qty: 5

## 2023-05-25 MED FILL — FLUTICASONE PROPIONATE 50 MCG/ACT NA SUSP: 50 MCG/ACT | NASAL | Qty: 16

## 2023-05-25 MED FILL — PANTOPRAZOLE SODIUM 40 MG PO TBEC: 40 MG | ORAL | Qty: 1

## 2023-05-25 MED FILL — CELECOXIB 100 MG PO CAPS: 100 MG | ORAL | Qty: 2

## 2023-05-25 MED FILL — LOSARTAN POTASSIUM 50 MG PO TABS: 50 MG | ORAL | Qty: 2

## 2023-05-25 MED FILL — LEVOFLOXACIN 500 MG PO TABS: 500 MG | ORAL | Qty: 1

## 2023-05-25 NOTE — Progress Notes (Addendum)
[facility] Cardiology Inpatient Progress Note    Patient is a 77 y.o. male of Ragheb, Essam S, MD seen in hospital follow up.     Chief complaint: Coronary calcifications/Chest pressure/SOB/Cough    HPI: Some SOB. No CP.    Patient Active Problem List   Diagnosis    Primary hypertension    Diverticular disease    Gastroesophageal reflux disease    Acute on chronic congestive heart failure, unspecified heart failure type (HCC)    Ventricular bigeminy    PAF (paroxysmal atrial fibrillation) (HCC)    PAT (paroxysmal atrial tachycardia) (HCC)    Chronic heart failure with preserved ejection fraction (HFpEF) (HCC)    Carpal tunnel syndrome, left    Acute on chronic combined systolic and diastolic congestive heart failure (HCC)       Allergies   Allergen Reactions    Lidocaine Other (See Comments)     "Light-headed and passed out"     Lidocaine-Epinephrine Other (See Comments)     "Light-headed and passed out"     Cephalexin Hives and Rash    Sulfa Antibiotics Rash       Current Facility-Administered Medications   Medication Dose Route Frequency Provider Last Rate Last Admin    regadenoson (LEXISCAN) injection 0.4 mg  0.4 mg IntraVENous ONCE PRN Campbell Riches T, DO        insulin lispro (HUMALOG,ADMELOG) injection vial 0-8 Units  0-8 Units SubCUTAneous TID WC Sharp, Amanda L, APRN - CNP        insulin lispro (HUMALOG,ADMELOG) injection vial 0-4 Units  0-4 Units SubCUTAneous Nightly Sharp, Amanda L, APRN - CNP        glucose chewable tablet 16 g  4 tablet Oral PRN Sharp, Amanda L, APRN - CNP        dextrose bolus 10% 125 mL  125 mL IntraVENous PRN Sharp, Amanda L, APRN - CNP        Or    dextrose bolus 10% 250 mL  250 mL IntraVENous PRN Sharp, Amanda L, APRN - CNP        glucagon injection 1 mg  1 mg SubCUTAneous PRN Sharp, Amanda L, APRN - CNP        dextrose 10 % infusion   IntraVENous Continuous PRN Sharp, Amanda L, APRN - CNP        bumetanide (BUMEX) injection 2 mg  2 mg IntraVENous BID Urbano Heir, APRN - CNP   2 mg  at 05/24/23 1856    ipratropium 0.5 mg-albuterol 2.5 mg (DUONEB) nebulizer solution 1 Dose  1 Dose Inhalation 4x Daily RT Urbano Heir, APRN - CNP   1 Dose at 05/25/23 1610    doxycycline hyclate (VIBRAMYCIN) capsule 100 mg  100 mg Oral 2 times per day Urbano Heir, APRN - CNP   100 mg at 05/24/23 2142    levoFLOXacin (LEVAQUIN) tablet 500 mg  500 mg Oral Daily Urbano Heir, APRN - CNP   500 mg at 05/24/23 1140    HYDROcodone homatropine (HYCODAN) 5-1.5 MG/5ML solution 5 mL  5 mL Oral Q4H PRN Urbano Heir, APRN - CNP   5 mL at 05/25/23 0436    acetaminophen (TYLENOL) tablet 650 mg  650 mg Oral Q6H PRN Rosana Berger, APRN - CNP        acetaminophen (TYLENOL) suppository 650 mg  650 mg Rectal Q4H PRN Sharp, Amanda L, APRN - CNP        ondansetron (ZOFRAN) injection 4 mg  4  mg IntraVENous Q6H PRN Rosana Berger, APRN - CNP        metoprolol (LOPRESSOR) injection 5 mg  5 mg IntraVENous Q6H PRN Sharp, Amanda L, APRN - CNP        nitroGLYCERIN (NITROSTAT) SL tablet 0.4 mg  0.4 mg SubLINGual Q5 Min PRN Sharp, Jill Side, APRN - CNP        morphine (PF) injection 2 mg  2 mg IntraVENous Q4H PRN Rosana Berger, APRN - CNP        budesonide (PULMICORT) nebulizer suspension 500 mcg  0.5 mg Nebulization BID RT Rosana Berger, APRN - CNP   500 mcg at 05/25/23 0619    arformoterol tartrate (BROVANA) nebulizer solution 15 mcg  15 mcg Nebulization BID RT Rosana Berger, APRN - CNP   15 mcg at 05/25/23 0981    methylPREDNISolone sodium succ (SOLU-MEDROL) 40 mg in sterile water 1 mL injection  40 mg IntraVENous Q8H Sharp, Jill Side, APRN - CNP   40 mg at 05/25/23 0530    atorvastatin (LIPITOR) tablet 20 mg  20 mg Oral Nightly Rosana Berger, APRN - CNP   20 mg at 05/24/23 2142    celecoxib (CELEBREX) capsule 200 mg  200 mg Oral Nightly Rosana Berger, APRN - CNP   200 mg at 05/24/23 2143    losartan (COZAAR) tablet 100 mg  100 mg Oral Daily Rosana Berger, APRN - CNP        potassium chloride (KLOR-CON M) extended release tablet 10  mEq  10 mEq Oral Once per day on Mon Wed Fri Sharp, Amanda L, APRN - CNP        [Held by provider] predniSONE (DELTASONE) tablet 5 mg  5 mg Oral Daily Sharp, Amanda L, APRN - CNP        pantoprazole (PROTONIX) tablet 40 mg  40 mg Oral QAM AC Sharp, Amanda L, APRN - CNP   40 mg at 05/25/23 0530       Review of systems:   Heart: as above   Lungs: as above   Eyes: denies changes in vision or discharge.   Ears: denies changes in hearing or pain.   Nose: denies epistaxis or masses   Throat: denies sore throat or trouble swallowing.   Neuro: denies numbness, tingling, tremors.   Skin: denies rashes or itching.   GU: denies hematuria, dysuria   GI: denies vomiting, diarrhea   Psych: denies mood changed, anxiety, depression.    Physical Exam   BP (!) 162/81   Pulse 83   Temp 97.7 F (36.5 C) (Oral)   Resp 18   Ht 1.702 m (5\' 7" )   Wt 100.7 kg (222 lb)   SpO2 96%   BMI 34.77 kg/m   Constitutional: Oriented to person, place, and time. No distress.  Well developed.  Head: Normocephalic and atraumatic.    Neck: Neck supple. No hepatojugular reflux. No JVD present. Carotid bruit is not present. No tracheal deviation present. No thyromegaly present.   Cardiovascular: Normal rate, regular rhythm, normal heart sounds.  intact distal pulses.  No gallop and no friction rub.  No murmur heard.  Pulmonary: Breath sounds normal. No respiratory distress. No wheezes. No rales.   Chest: Effort normal. No tenderness.  Abdominal: Soft. Bowel sounds are normal. No distension or mass. No tenderness, rebound or guarding.   Musculoskeletal: . No tenderness. No clubbing or cyanosis.  Extremitites: Intact distal pulses. No edema  Neurological: Alert and  oriented to person, place, and time.   Skin: Skin is warm and dry. No rash noted. Not diaphoretic. No erythema.   Psychiatric: Normal mood and affect. Behavior is normal.   Lymphadenopathy: No cervical adenopathy. No groin adenopathy.    CBC:   Lab Results   Component Value Date/Time    WBC  7.3 05/24/2023 05:07 AM    RBC 4.40 05/24/2023 05:07 AM    HGB 13.0 05/24/2023 05:07 AM    HCT 38.0 05/24/2023 05:07 AM    MCV 86.4 05/24/2023 05:07 AM    MCH 29.5 05/24/2023 05:07 AM    MCHC 34.2 05/24/2023 05:07 AM    RDW 14.1 05/24/2023 05:07 AM    PLT 193 05/24/2023 05:07 AM    MPV 9.8 05/24/2023 05:07 AM     BMP:   Lab Results   Component Value Date/Time    NA 141 05/24/2023 05:07 AM    K 3.8 05/24/2023 05:07 AM    K 3.6 10/04/2021 09:17 PM    CL 105 05/24/2023 05:07 AM    CO2 25 05/24/2023 05:07 AM    BUN 15 05/24/2023 05:07 AM    CREATININE 0.8 05/24/2023 05:07 AM    CALCIUM 8.6 05/24/2023 05:07 AM    GFRAA >60 10/04/2021 09:17 PM    LABGLOM >90 05/24/2023 05:07 AM    LABGLOM >60 06/23/2022 10:35 AM     Magnesium:    Lab Results   Component Value Date/Time    MG 2.1 09/04/2019 05:07 PM     Cardiac Enzymes:   Lab Results   Component Value Date    TROPHS 14 (H) 05/24/2023    TROPHS 16 (H) 05/23/2023    TROPHS 16 (H) 05/23/2023      PT/INR:  No results found for: "PROTIME", "INR"  TSH:    Lab Results   Component Value Date/Time    TSH 0.77 05/24/2023 05:07 AM     Rhythm Strip: normal sinus rhythm.    Stress Test: Cardiac Testing:  EKG - (06/22/2017) Sinus bradycardia.  Normal ECG.  Echocardiogram - (06/26/2017) Normal left ventricular systolic function with stage I diastolic dysfunction.  Trace mitral valve regurgitation.  Trace tricuspid valve regurgitation with RVSP of 30 mmHg.  Stress Test - (07/05/2017) Negative Lexiscan stress test for ischemic symptoms or  ischemic EKG changes.  Normal Cardiolite perfusion scan without evidence of fixed or reversible defect.  Normal left ventricular systolic function with normal wall motion.  There is no old comparison study.  The results of the study predict low probability for significant coronary artery disease or future cardiac events.    Echo Summary 09/05/19:   Technically difficult examination. Enhancing contrast was used to define endocardial borders.   Normal left  ventricular chamber size.   Normal left ventricular systolic function.   Visually estimated LVEF is 60-65 %.   No wall motion abnormalities.   Normal right ventricle structure and function.   No significant valvular abnormalities.   No comparison study available.    Echo Summary 07/24/2021:  No previous echo for comparison. Technically adequate study.  Left ventricle size is normal.  Mild concentric left ventricular hypertrophy.  Ejection fraction is visually estimated at 55%.  No regional wall motion abnormalities seen.  There is doppler evidence of stage II diastolic dysfunction.  Physiologic and/or trace tricuspid regurgitation.  RVSP is 37 mmHg.  Pulmonary hypertension is mild.    ECHO: TEE [facility] at time of Watchman, 06/09/2022)  Findings discussed with Dr. Lysle Morales.  L.V.: The visually estimated EF is 60-65%. Left ventricular diastolic function is grade I dysfunction. RWMA: There are no regional wall motion abnormalities. R.V.: Normal right ventricle size and function. L.A.: The left atrium is normal. There is no thrombus identified in the left atrial appendage. R.A.: The right atrium is normal. I.A.S.: The interatrial septum appears normal. The interatrial septum appears intact (no ASD or shunt). I.V.S.: Interventricular septum morphology: moderately hypertrophied. Pericardium.: The pericardium is normal. Pleural.: There is no pleural effusion. Ao.: The descending aorta thickness suggests intimal thickening. A.V.: The aortic valve is trileaflet, structurally normal, without evidence of stenosis or regurgitation. M.V.: The mitral valve is structurally normal, without evidence of stenosis with mild regurgitation. T.V.: The tricuspid valve is structurally normal with no evidence of regurgitation or stenosis. P.V.: The pulmonic valve is structured normally with no evidence of stenosis with trace regurgitation.     Echo Summary 05/24/2023:    Image quality is suboptimal. Contrast used: Definity.    Left Ventricle:  Normal left ventricular systolic function with a visually estimated EF of 60 - 65%. Left ventricle size is normal. Mildly increased wall thickness. Normal wall motion.    Right Ventricle: Right ventricle size is normal. Normal systolic function. TAPSE is 2.3 cm.    Left Atrium: Left atrial volume index is normal (16-34 mL/m2).    Mitral Valve: Mildly thickened leaflet. Trace regurgitation.    Tricuspid Valve: Trace regurgitation. The estimated RVSP is 35 mmHg.    Pericardium: No pericardial effusion.    ASSESSMENT AND PLAN:  Patient Active Problem List   Diagnosis    Primary hypertension    Diverticular disease    Gastroesophageal reflux disease    Acute on chronic congestive heart failure, unspecified heart failure type (HCC)    Ventricular bigeminy    PAF (paroxysmal atrial fibrillation) (HCC)    PAT (paroxysmal atrial tachycardia) (HCC)    Chronic heart failure with preserved ejection fraction (HFpEF) (HCC)    Carpal tunnel syndrome, left    Acute on chronic combined systolic and diastolic congestive heart failure (HCC)     1. Coronary Calcifications:    Chart/labs/EKG/monitor reviewed.     Echo as above.     Pharm stress today.    Statin. Add ASA/BB as indicated.    2. Chronic diastolic CHF:     Echo ordered.     Observe volume.    3. PAF:     Hx AF Ablation x 2 (both in [facility], first procedure "a couple of years ago"; second within the past year).    S/p Watchman procedure in [facility] 06/09/2022.    In sinus.     Monitor.     4. Fatigue/Cough: Viral panel positive for Parainfluenza 3. Per primary service.    5. HTN: Observe.     6. Lipids: Statin.     7. PMR: On prednisone.    8. OSA    9. 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   Available imaging and evaluations independently reviewed.   Interviewed and discussed patient with available family.  Discussed case with referring service and non-cardiology consultants.     Greater than 50 minutes was spent counseling the patient, reviewing  the rationale for the above recommendations and reviewing the patient's current medication list, problem list and results of all previously ordered testing.    Jory Sims, D.O.  Cardiologist  Cardiology, [facility]    Addendum:     Stress  Summary 05/25/2023:    Stress Test: A pharmacological stress test was performed using regadenoson Eugenie Birks). PO caffeine given as a reversal agent. The patient reported no symptoms during the stress test. The patient reached the end of the protocol.    ECG: The stress ECG was negative for ischemia.    Stress Combined Conclusion: Normal pharmacological myocardial perfusion study. Findings suggest a low risk of cardiac events.    Stress Function: Left ventricular function post-stress is normal. Post-stress ejection fraction is 54%.    Perfusion Comments: LV perfusion is normal. There is no evidence of inducible ischemia.  Inferior attenuation artifact present.    Perfusion Conclusion: There is no evidence of transient ischemic dilation (TID).    Image quality is good.    Low risk stress.     Patient stable from CV standpoint. Please call if needed. TYVM.    Jory Sims, D.O.  Cardiologist  Cardiology, [facility]

## 2023-05-25 NOTE — Plan of Care (Signed)
Problem: Safety - Adult  Goal: Free from fall injury  Outcome: Progressing     Problem: Chronic Conditions and Co-morbidities  Goal: Patient's chronic conditions and co-morbidity symptoms are monitored and maintained or improved  Outcome: Progressing

## 2023-05-25 NOTE — Care Coordination-Inpatient (Signed)
Ss note: 05/25/2023  11:13 AM Per IDR pt in contact isolation for  parainfluenza. Pt for stress test today, currently oor, pt is on room air. Per IDR pt was to have left hand carpel tunnel sx however this is on hold due to hospitalization. SW/CM to follow up for full assessment/discharge needs. Evlyn Courier, LSW

## 2023-05-25 NOTE — Progress Notes (Signed)
Internal Medicine Consult Note    [facility]    Jonelle Sports. Veronnica Hennings, D.O., F.A.C.O.I.                    Talitha Givens, D.O., F.A.C.O.I.                             Hoy Register, D.O.     Franchot Heidelberg, MSN, APRN, NP-C  Lamonte Richer. Earlene Plater, MSN, APRN-CNP  Urbano Heir, MSN, APRN-CNP  Vilinda Boehringer, MSN APRN-CNP  Lucy Antigua, MSN. 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 male who was seen and examined today,05/25/2023, at the bedside.  Patient was up walking around his room upon examination.  He states his shortness of breath has greatly improved along with his cough and his wheezing.  He is positive for parainfluenza and will just take a matter of time for his symptoms to improve.  No family present during my examination.    Review of System:   Constitutional:   Denies fever or chills, weight loss or gain, positive fatigue.  HEENT:   Denies ear pain, sore throat, sinus or eye problems.  Cardiovascular:   Denies any chest pain, irregular heartbeats, or palpitations.   Respiratory:   Denies shortness of breath sputum production, hemoptysis, reports improvement with cough and wheezing  Gastrointestinal:   Denies nausea, vomiting, diarrhea, or constipation.  Denies any abdominal pain.  Genitourinary:    Denies any urgency, frequency, hematuria. Voiding  without difficulty.  Extremities:   Denies lower extremity swelling, edema or cyanosis.   Neurology:     Denies any headache or focal neurological deficits, Denies generalized weakness or memory difficulty.   Psch:   Denies being anxious or depressed.  Musculoskeletal:    Denies  myalgias, joint complaints or back pain.   Integumentary:   Denies any rashes, ulcers, or excoriations.  Denies bruising.  Hematologic/Lymphatic:  Denies bruising or bleeding.    Physical Exam:  No intake/output data recorded.  No intake or output data in the 24 hours ending 05/25/23 1708No intake/output data recorded.  Patient Vitals for the past 96 hrs (Last 3 readings):   Weight   05/25/23 0530 100.7 kg (222 lb)   05/24/23 1057 104.8 kg (231 lb)   05/24/23 1019 104.8 kg (231 lb)       Vital Signs:   Blood pressure (!) 150/81, pulse 83, temperature 97.7 F (36.5 C), temperature source Oral, resp. rate 18, height 1.702 m (5\' 7" ), weight 100.7 kg (222 lb), SpO2 96 %.    General appearance:  Alert, responsive, oriented to person, place, and time.  Last ill-appearing   head:  Normocephalic. No masses, lesions or tenderness.  Eyes:  PERRLA.  EOMI.  Sclera clear.  Buccal mucosa moist.  ENT:  Ears normal. Mucosa normal.  Neck:    Supple.  Trachea midline. No thyromegaly. No JVD. No bruits.  Heart:    Rhythm regular. Rate controlled.  No murmurs.  Lungs:    Symmetrical.  Diminished bilaterally with improving expiratory wheezing. No rhonchi. No rales.  Abdomen:   Soft. Non-tender. Non-distended. Bowel sounds positive. No organomegaly or masses.  No pain on palpation.  Extremities:    Peripheral pulses present.  No peripheral edema.  No ulcers. No cyanosis. No clubbing.  Neurologic:    Alert x 3.  No focal deficit.  Cranial nerves grossly intact. No focal weakness.  Psych:   Behavior is normal. Mood appears normal. Speech is not rapid and/or pressured.  Musculoskeletal:   Spine ROM normal. Muscular strength intact. Gait not assessed.  Integumentary:  No rashes  Skin normal color and texture.  Genitalia/Breast:  Deferred    Medication:  Scheduled  Meds:   fluticasone  2 spray Each Nostril Daily    [START ON 05/26/2023] predniSONE  20 mg Oral Daily    insulin lispro  0-8 Units SubCUTAneous TID WC    insulin lispro  0-4 Units SubCUTAneous Nightly    bumetanide  2 mg IntraVENous BID    ipratropium 0.5 mg-albuterol 2.5 mg  1 Dose Inhalation 4x Daily RT    doxycycline hyclate  100 mg Oral 2 times per day    levoFLOXacin  500 mg Oral Daily    budesonide  0.5 mg Nebulization BID RT    arformoterol tartrate  15 mcg Nebulization BID RT    atorvastatin  20 mg Oral Nightly    celecoxib  200 mg Oral Nightly    losartan  100 mg Oral Daily    potassium chloride  10 mEq Oral Once per day on Mon Wed Fri    pantoprazole  40 mg Oral QAM AC     Continuous Infusions:   dextrose         Objective Data:  Recent Labs     05/23/23  1636 05/24/23  0507 05/25/23  0852   WBC 8.0 7.3 16.6*   RBC 4.76 4.40 4.91   HGB 13.8 13.0 13.9   HCT 41.7 38.0 42.9   MCV 87.6 86.4 87.4   MCH 29.0 29.5 28.3   MCHC 33.1 34.2 32.4   RDW 14.1 14.1 14.4   PLT 208 193 225   MPV 9.9 9.8 10.1       Recent Labs     05/23/23  1636 05/24/23  0507 05/25/23  0852   NA 143 141 140   K 4.1 3.8 4.0   CL 104 105 100   CO2 28 25 26    BUN 12 15 24*   CREATININE 0.8 0.8 0.9   GLUCOSE 92 177* 125*   CALCIUM 8.6 8.6 9.2   BILITOT 0.3 0.3 0.4   ALKPHOS 84 79 76   AST 19 16 27    ALT 16 14 16    ALBUMIN 4.1 3.8 4.2       Lab Results   Component Value Date    TROPONINI <0.01 09/05/2019    TROPONINI <0.01 09/04/2019    TROPONINI <0.01 09/04/2019          Wound Documentation:   Wound 05/05/21 Leg Left;Posterior new #1 left posterior leg (Active)   Number of days: 749       Assessment:  Acute exacerbation of CHF on home regimen  Elevated troponin no delta change likely due to demand ischemia  Hyperlipidemia on statin  GERD on PPI  Essential hypertension  on ARB  Carpal tunnel syndrome left wrist    Plan:   Start Brovana, Pulmicort, and DuoNeb aerosol treatments  IV steroids changed to prednisone  Hycodan cough syrup  Respiratory  molecular panel positive for parainfluenza  Check respiratory culture pending  Cardiology following  Diuretic therapy with Bumex  Antibiotic therapy with doxycycline  Humalog insulin for glycemic control    Continue current therapy.  See orders for further plan of care.    More than 50% of my  time was spent at the bedside counseling/coordinating care with the patient and/or family with face to face contact.  This time was spent reviewing notes and laboratory data as well as instructing and counseling the patient. Time I spent with the family or surrogate(s) is included only if the patient was incapable of providing the necessary information or participating in medical decisions. I also discussed the differential diagnosis and all of the proposed management plans with the patient and individuals accompanying the patient.    The patient was seen, examined and then discussed with Dr. Harlin Rain.     Urbano Heir, APRN - CNP  05/25/2023  5:08 PM        I saw and evaluated the patient. I agree with the findings and the plan of care as documented in Urbano Heir APRN-CNP note.    Alvin Critchley, DO, FACOI  5:19 PM  05/25/2023

## 2023-05-25 NOTE — Care Coordination-Inpatient (Signed)
Pt currently off floor for a stress test, will try again at a later time to review IMM Electronically signed by Particia Lather, CMA

## 2023-05-25 NOTE — Progress Notes (Signed)
Physician Progress Note      PATIENTJONI, Jeffrey [name]  CSN #:                  [id number]  DOB:                       1946/10/19  ADMIT DATE:       05/23/2023 4:15 PM  DISCH DATE:  Amedeo Gory  PROVIDER #:        Jonelle Sports Jadalyn Oliveri DO          QUERY TEXT:    Pt admitted with CHF. Noted documentation of chronic Diastolic CHF by   Cardiology consultant, and acute exacerbation of CHF in IM notes. If possible,   please document in progress notes and discharge summary further specificity   regarding the type and acuity of CHF:    The medical record reflects the following:  Risk Factors: CHF, HTN, watchman in place, PAF,  Clinical Indicators:  ECHO EF 60-65%, Probnp 265, Cxr chronic findings  Treatment: IV Bumex, Deltasone, Cozaar, Lopressor, antibiotics, 5/30: start   Brovana, Pulmicort, Duonebs, IV steroids,Hycodan,    Thank you, Sophia teffner RN CDI  [phone]  Options provided:  -- Acute on Chronic Diastolic CHF/HFpEF  -- Chronic Diastolic CHF/HFpEF  -- Other - I will add my own diagnosis  -- Disagree - Not applicable / Not valid  -- Disagree - Clinically unable to determine / Unknown  -- Refer to Clinical Documentation Reviewer    PROVIDER RESPONSE TEXT:    This patient is in acute on chronic diastolic CHF/HFpEF.    Query created by: Selina Cooley on 05/25/2023 10:13 AM      Electronically signed by:  Alvin Critchley DO 05/25/2023 5:48 PM

## 2023-05-26 DIAGNOSIS — R1012 Left upper quadrant pain: Secondary | ICD-10-CM

## 2023-05-26 LAB — CBC WITH AUTO DIFFERENTIAL
Basophils %: 0 % (ref 0.0–2.0)
Basophils Absolute: 0.04 10*3/uL (ref 0.00–0.20)
Eosinophils %: 0 % (ref 0–6)
Eosinophils Absolute: 0.03 10*3/uL — ABNORMAL LOW (ref 0.05–0.50)
Hematocrit: 44.5 % (ref 37.0–54.0)
Hemoglobin: 14.2 g/dL (ref 12.5–16.5)
Immature Granulocytes %: 1 % (ref 0.0–5.0)
Immature Granulocytes Absolute: 0.07 10*3/uL (ref 0.00–0.58)
Lymphocytes %: 17 % — ABNORMAL LOW (ref 20.0–42.0)
Lymphocytes Absolute: 2.42 10*3/uL (ref 1.50–4.00)
MCH: 28.3 pg (ref 26.0–35.0)
MCHC: 31.9 g/dL — ABNORMAL LOW (ref 32.0–34.5)
MCV: 88.6 fL (ref 80.0–99.9)
MPV: 10.3 fL (ref 7.0–12.0)
Monocytes %: 7 % (ref 2.0–12.0)
Monocytes Absolute: 1.05 10*3/uL — ABNORMAL HIGH (ref 0.10–0.95)
Neutrophils %: 75 % (ref 43.0–80.0)
Neutrophils Absolute: 10.98 10*3/uL — ABNORMAL HIGH (ref 1.80–7.30)
Platelets: 234 10*3/uL (ref 130–450)
RBC: 5.02 m/uL (ref 3.80–5.80)
RDW: 14.7 % (ref 11.5–15.0)
WBC: 14.6 10*3/uL — ABNORMAL HIGH (ref 4.5–11.5)

## 2023-05-26 LAB — COMPREHENSIVE METABOLIC PANEL W/ REFLEX TO MG FOR LOW K
ALT: 18 U/L (ref 0–40)
AST: 28 U/L (ref 0–39)
Albumin: 4 g/dL (ref 3.5–5.2)
Alkaline Phosphatase: 71 U/L (ref 40–129)
Anion Gap: 11 mmol/L (ref 7–16)
BUN: 35 mg/dL — ABNORMAL HIGH (ref 6–23)
CO2: 31 mmol/L — ABNORMAL HIGH (ref 22–29)
Calcium: 8.9 mg/dL (ref 8.6–10.2)
Chloride: 95 mmol/L — ABNORMAL LOW (ref 98–107)
Creatinine: 1.2 mg/dL (ref 0.70–1.20)
Est, Glom Filt Rate: 62 mL/min/{1.73_m2} (ref >60)
Glucose: 82 mg/dL (ref 74–99)
Potassium: 3.4 mmol/L — ABNORMAL LOW (ref 3.5–5.0)
Sodium: 137 mmol/L (ref 132–146)
Total Bilirubin: 0.4 mg/dL (ref 0.0–1.2)
Total Protein: 7.3 g/dL (ref 6.4–8.3)

## 2023-05-26 LAB — MAGNESIUM: Magnesium: 1.9 mg/dL (ref 1.6–2.6)

## 2023-05-26 LAB — POCT GLUCOSE
POC Glucose: 109 mg/dL — ABNORMAL HIGH (ref 74–99)
POC Glucose: 85 mg/dL (ref 74–99)

## 2023-05-26 MED ORDER — LEVOFLOXACIN 500 MG PO TABS
500 MG | ORAL_TABLET | Freq: Every day | ORAL | 0 refills | Status: DC
Start: 2023-05-26 — End: 2023-05-29

## 2023-05-26 MED ORDER — ALBUTEROL SULFATE HFA 108 (90 BASE) MCG/ACT IN AERS: 108 (90 Base) MCG/ACT | RESPIRATORY_TRACT | 0 refills | Status: DC | PRN

## 2023-05-26 MED ORDER — BUDESONIDE-FORMOTEROL FUMARATE 160-4.5 MCG/ACT IN AERO: RESPIRATORY_TRACT | 1 refills | Status: DC

## 2023-05-26 MED ORDER — BENZONATATE 200 MG PO CAPS: 200 mg | ORAL | 0 refills | Status: AC | PRN

## 2023-05-26 MED ORDER — PREDNISONE 10 MG PO TABS
10 MG | ORAL_TABLET | ORAL | 0 refills | Status: DC
Start: 2023-05-26 — End: 2023-07-05

## 2023-05-26 MED FILL — PREDNISONE 20 MG PO TABS: 20 MG | ORAL | Qty: 1

## 2023-05-26 MED FILL — ATORVASTATIN CALCIUM 20 MG PO TABS: 20 MG | ORAL | Qty: 1

## 2023-05-26 MED FILL — HYDROCODONE BIT-HOMATROP MBR 5-1.5 MG/5ML PO SOLN: ORAL | Qty: 5

## 2023-05-26 MED FILL — LOSARTAN POTASSIUM 50 MG PO TABS: 50 MG | ORAL | Qty: 2

## 2023-05-26 MED FILL — DOXYCYCLINE HYCLATE 100 MG PO CAPS: 100 MG | ORAL | Qty: 1

## 2023-05-26 MED FILL — KLOR-CON M10 10 MEQ PO TBCR: 10 MEQ | ORAL | Qty: 1

## 2023-05-26 MED FILL — PANTOPRAZOLE SODIUM 40 MG PO TBEC: 40 MG | ORAL | Qty: 1

## 2023-05-26 MED FILL — BUMETANIDE 0.25 MG/ML IJ SOLN: 0.25 MG/ML | INTRAMUSCULAR | Qty: 8

## 2023-05-26 MED FILL — CELECOXIB 100 MG PO CAPS: 100 MG | ORAL | Qty: 2

## 2023-05-26 MED FILL — LEVOFLOXACIN 500 MG PO TABS: 500 MG | ORAL | Qty: 1

## 2023-05-26 NOTE — ED Provider Notes (Signed)
HPI   This is a 77 year old male patient presents emergency department for evaluation of abdominal pain.  He was recently admitted and discharged for pneumonia.  He states he has been coughing so much that he has developed left upper quadrant abdominal pain.  States that the pain is worsened by coughing.  He he denies any nausea or vomiting.  No fevers or chills.  He denies chest pain or shortness of breath.  Review of Systems   Constitutional:  Negative for chills and fever.   HENT:  Negative for ear pain, sinus pressure and sore throat.    Eyes:  Negative for pain, discharge and redness.   Respiratory:  Positive for cough. Negative for shortness of breath and wheezing.    Cardiovascular:  Negative for chest pain.   Gastrointestinal:  Negative for abdominal pain, diarrhea, nausea and vomiting.        Left upper quadrant abdominal pain   Genitourinary:  Negative for dysuria and frequency.   Musculoskeletal:  Negative for arthralgias and back pain.   Skin:  Negative for rash and wound.   Neurological:  Negative for weakness and headaches.   Hematological:  Negative for adenopathy.   All other systems reviewed and are negative.       Physical Exam  Vitals and nursing note reviewed.   Constitutional:       Appearance: He is well-developed.   HENT:      Head: Normocephalic and atraumatic.   Eyes:      Conjunctiva/sclera: Conjunctivae normal.   Cardiovascular:      Rate and Rhythm: Normal rate and regular rhythm.      Heart sounds: Normal heart sounds. No murmur heard.  Pulmonary:      Effort: Pulmonary effort is normal. No respiratory distress.      Breath sounds: Normal breath sounds. No wheezing or rales.   Abdominal:      Palpations: Abdomen is soft.      Tenderness: There is no abdominal tenderness. There is no guarding or rebound.      Comments: Left upper quadrant mild tenderness to palpation.  No palpable masses, no overlying skin changes.  No rebound or guarding.   Musculoskeletal:         General: No  tenderness or deformity.      Cervical back: Normal range of motion and neck supple.   Skin:     General: Skin is warm and dry.   Neurological:      Mental Status: He is alert and oriented to person, place, and time.      Cranial Nerves: No cranial nerve deficit.      Coordination: Coordination normal.          Procedures     MDM  External chart review performed by me patient just discharged from the hospital yesterday under Dr. Vedia Coffer, for a CHF exacerbation and pneumonia and discharged on Levaquin.    Differential diagnosis including hernia, abdominal wall strain, diverticulitis.  Labs obtained here electrolytes within normal limits, sodium 138, potassium 3.8 normal as interpreted by me.  Creatinine stable 1.2.  No acute lactic acidosis.  Lactic acid 1.2.  LFTs all within normal limits, no significant leukocytosis.  CT scan of the abdomen pelvis was obtained no acute intra-abdominal abnormalities were noted, likely with patient having cough in the setting of recent pneumonia he may have an abdominal wall strain.  He is stable for discharge home, close return instructions provided.    Medications  fentaNYL (SUBLIMAZE) injection 50 mcg (50 mcg IntraVENous Given 05/27/23 0109)   HYDROmorphone HCl PF (DILAUDID) injection 1 mg (1 mg IntraVENous Given 05/27/23 0210)   iopamidol (ISOVUE-370) 76 % injection 75 mL (75 mLs IntraVENous Given 05/27/23 0229)       ED Course as of 05/27/23 0411   Sun May 27, 2023   0411 Pain controlled at this time.  Vitals within normal limits.  Stable for discharge. [FG]      ED Course User Index  [FG] Aggie Moats, DO       --------------------------------------------- PAST HISTORY ---------------------------------------------  Past Medical History:  has a past medical history of Atrial fibrillation (HCC), GERD (gastroesophageal reflux disease), Hyperlipidemia, Hypertension, and Sleep apnea.    Past Surgical History:  has a past surgical history that includes Appendectomy; hernia repair; Cardiac  surgery; and Colonoscopy.    Social History:  reports that he has never smoked. He has never used smokeless tobacco. He reports current alcohol use of about 2.0 standard drinks of alcohol per week. He reports that he does not use drugs.    Family History: family history includes Heart Attack in his father.     The patient's home medications have been reviewed.    Allergies: Lidocaine, Lidocaine-epinephrine, Cephalexin, and Sulfa antibiotics    -------------------------------------------------- RESULTS -------------------------------------------------  Labs:  Results for orders placed or performed during the hospital encounter of 05/27/23   CBC with Auto Differential   Result Value Ref Range    WBC 11.9 (H) 4.5 - 11.5 k/uL    RBC 5.00 3.80 - 5.80 m/uL    Hemoglobin 14.0 12.5 - 16.5 g/dL    Hematocrit 16.1 09.6 - 54.0 %    MCV 85.4 80.0 - 99.9 fL    MCH 28.0 26.0 - 35.0 pg    MCHC 32.8 32.0 - 34.5 g/dL    RDW 04.5 40.9 - 81.1 %    Platelets 232 130 - 450 k/uL    MPV 9.9 7.0 - 12.0 fL    Neutrophils % 74 43.0 - 80.0 %    Lymphocytes % 19 (L) 20.0 - 42.0 %    Monocytes % 7 2.0 - 12.0 %    Eosinophils % 0 0 - 6 %    Basophils % 0 0.0 - 2.0 %    Immature Granulocytes % 1 0.0 - 5.0 %    Neutrophils Absolute 8.78 (H) 1.80 - 7.30 k/uL    Lymphocytes Absolute 2.22 1.50 - 4.00 k/uL    Monocytes Absolute 0.83 0.10 - 0.95 k/uL    Eosinophils Absolute 0.00 (L) 0.05 - 0.50 k/uL    Basophils Absolute 0.03 0.00 - 0.20 k/uL    Immature Granulocytes Absolute 0.06 0.00 - 0.58 k/uL   Comprehensive Metabolic Panel w/ Reflex to MG   Result Value Ref Range    Sodium 138 132 - 146 mmol/L    Potassium 3.8 3.5 - 5.0 mmol/L    Chloride 97 (L) 98 - 107 mmol/L    CO2 29 22 - 29 mmol/L    Anion Gap 12 7 - 16 mmol/L    Glucose 101 (H) 74 - 99 mg/dL    BUN 36 (H) 6 - 23 mg/dL    Creatinine 1.2 9.14 - 1.20 mg/dL    Est, Glom Filt Rate 65 >60 mL/min/1.41m2    Calcium 8.8 8.6 - 10.2 mg/dL    Total Protein 7.3 6.4 - 8.3 g/dL    Albumin 4.0 3.5 - 5.2  g/dL  Total Bilirubin 0.5 0.0 - 1.2 mg/dL    Alkaline Phosphatase 73 40 - 129 U/L    ALT 19 0 - 40 U/L    AST 28 0 - 39 U/L   Lipase   Result Value Ref Range    Lipase 19 13 - 60 U/L   Lactic Acid   Result Value Ref Range    Lactic Acid 1.2 0.5 - 2.2 mmol/L       Radiology:  CT ABDOMEN PELVIS W IV CONTRAST Additional Contrast? None   Final Result   1. No acute intra-abdominal or pelvic abnormality.   2. Small hiatal hernia.   3. Bilateral renal cysts.   4. Diverticulosis. No evidence of acute diverticulitis.             ------------------------- NURSING NOTES AND VITALS REVIEWED ---------------------------  Date / Time Roomed:  05/27/2023 12:07 AM  ED Bed Assignment:  [id number]    The nursing notes within the ED encounter and vital signs as below have been reviewed.   BP (!) 160/76   Pulse 78   Temp 97.1 F (36.2 C)   Resp 22   SpO2 95%   Oxygen Saturation Interpretation: Normal      --------------------------------- ADDITIONAL PROVIDER NOTES ---------------------------------  At this time the patient is without objective evidence of an acute process requiring hospitalization or inpatient management.  They have remained hemodynamically stable throughout their entire ED visit and are stable for discharge with outpatient follow-up.     The plan has been discussed in detail and they are aware of the specific conditions for emergent return, as well as the importance of follow-up.      New Prescriptions    No medications on file       Diagnosis:  1. Abdominal wall pain        Disposition:  Patient's disposition: Discharge to home  Patient's condition is stable.            Aggie Moats, DO  05/27/23 [phone]

## 2023-05-26 NOTE — Discharge Instructions (Addendum)
Your information:  Name: ADYAN HARBESON  DOB: 01-02-1946    Your instructions:    YOU ARE BEING DISCHARGED HOME. PLEASE MAKE AND KEEP YOUR FOLLOW UP APPOINTMENTS.    What to do after you leave the hospital:    Recommended diet: regular diet, low sodium diet, 2 gm per day     Recommended activity: activity as tolerated    IF YOU EXPERIENCE ANY OF THE FOLLOWING SYMPTOMS, CHEST PAIN, SHORTNESS OF BREATH, COUGHING UP BLOOD OR BLOODY SPUTUM, STOMACH PAIN OR CRAMPING, DARK, TARRY STOOLS, LOSS OF APPETITE, GENERAL NOT FEELING WELL, SIGNS AND SYMPTOMS OF INFECTION LIKE FEVER AND OR CHILLS, PLEASE CALL DR RAGHEB OR RETURN TO THE EMERGENCY ROOM.    The following personal items were collected during your admission and were returned to you:    Belongings  Dental Appliances: None  Vision - Corrective Lenses: Eyeglasses  Hearing Aid: Sent home  Clothing: Other (Comment)  Jewelry: None  Body Piercings Removed: No  Electronic Devices: None  Weapons (Notify Protective Services/Security): None  Other Valuables: Other (Comment)  Home Medications: None  Valuables Given To: Other (Comment)  Provide Name(s) of Who Valuable(s) Were Given To: na  Responsible person(s) in the waiting room: na  Patient approves for provider to speak to responsible person post operatively: No    Information obtained by:  By signing below, I understand that if any problems occur once I leave the hospital I am to contact Dr Shona Simpson or go to emergency room.  I understand and acknowledge receipt of the instructions indicated above.

## 2023-05-26 NOTE — Discharge Summary (Signed)
Internal Medicine Progress Note     [facility]     Jonelle Sports. Bisel, D.O., F.A.C.O.I.                         Talitha Givens, D.O., F.A.C.O.I.  Hoy Register, D.O.     Franchot Heidelberg, MSN, APRN, NP-C  Lamonte Richer. 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 Shona Simpson advised to come here for CHF exacerbation.  He states he has a history of A-fib and was on blood thinners until having a Watchman procedure which was about a year and a half ago.  He has been on tapering oral prednisone since that time.  No current blood thinner use.  He has a scheduled outpatient carpal tunnel surgery with Dr. Joseph Art this Friday has numbness and tingling in the left hand.  He has been having ongoing fatigue also associated with exertional dyspnea for the past month.  He has a dry nonproductive cough and notes an increase in his lower extremity edema however he did not take his Bumex this morning.     LABS::  Lab Results   Component Value Date    WBC 14.6 (H) 05/26/2023    HGB 14.2 05/26/2023    HCT 44.5 05/26/2023     PLT 234 05/26/2023    NA 137 05/26/2023    K 3.4 (L) 05/26/2023    CL 95 (L) 05/26/2023    CREATININE 1.2 05/26/2023    BUN 35 (H) 05/26/2023    CO2 31 (H) 05/26/2023    GLUCOSE 82 05/26/2023    ALT 18 05/26/2023    AST 28 05/26/2023     No results found for: "INR", "PROTIME"   Lab Results   Component Value Date    TSH 0.77 05/24/2023     Lab Results   Component Value Date    TRIG 51 05/24/2023    TRIG 111 05/22/2023    TRIG 182 (H) 09/05/2019     Lab Results   Component Value Date    HDL 46 05/24/2023    HDL 45 05/22/2023    HDL 34 09/05/2019     No components found for: "LDLCALC"  Lab Results   Component Value Date    LABA1C 5.3 09/05/2019       IMAGING:  Nuclear stress test with myocardial perfusion    Result Date: 05/25/2023    Stress Test: A pharmacological stress test was performed using regadenoson (Lexiscan).  PO caffeine given as a reversal agent. The patient reported no symptoms during the stress test. The patient reached the end of the protocol.   ECG: The stress ECG was negative for ischemia.   Stress Combined Conclusion: Normal pharmacological myocardial perfusion study. Findings suggest a low risk of cardiac events.   Stress Function: Left ventricular function post-stress is normal. Post-stress ejection fraction is 54%.   Perfusion Comments: LV perfusion is normal. There is no evidence of inducible ischemia.  Inferior attenuation artifact present.   Perfusion Conclusion: There is no evidence of transient ischemic dilation (TID).   Image quality is good.     Echo (TTE) complete (PRN contrast/bubble/strain/3D)    Result Date: 05/24/2023    Image quality is suboptimal. Contrast used: Definity.   Left Ventricle: Normal left ventricular systolic function with a visually estimated EF of 60 - 65%. Left ventricle size is normal. Mildly increased wall thickness. Normal wall motion.   Right Ventricle: Right ventricle size is normal. Normal systolic function. TAPSE is 2.3 cm.   Left Atrium: Left atrial volume index  is normal (16-34 mL/m2).   Mitral Valve: Mildly thickened leaflet. Trace regurgitation.   Tricuspid Valve: Trace regurgitation. The estimated RVSP is 35 mmHg.   Pericardium: No pericardial effusion.     CTA PULMONARY W CONTRAST    Result Date: 05/23/2023  EXAMINATION: CTA OF THE CHEST 05/23/2023 5:40 pm TECHNIQUE: CTA of the chest was performed after the administration of intravenous contrast.  Multiplanar reformatted images are provided for review.  MIP images are provided for review. Automated exposure control, iterative reconstruction, and/or weight based adjustment of the mA/kV was utilized to reduce the radiation dose to as low as reasonably achievable. COMPARISON: None. HISTORY: ORDERING SYSTEM PROVIDED HISTORY: evaluate for PE TECHNOLOGIST PROVIDED HISTORY: Reason for exam:->evaluate for PE Additional Contrast?->1 FINDINGS: Pulmonary Arteries: Pulmonary arteries are adequately opacified for evaluation.  No evidence of intraluminal filling defect to suggest pulmonary embolism.  Main pulmonary artery is normal in caliber. Mediastinum: No evidence of mediastinal lymphadenopathy. There is moderate coronary calcification.  The heart is normal in size. There is no acute abnormality of the thoracic aorta.  There is moderate tortuosity of the thoracic aorta without aneurysmal dilatation. Lungs/pleura: The lungs are without acute process.  No focal consolidation or pulmonary edema.  No evidence of pleural effusion or pneumothorax. Upper Abdomen: Calcified granulomata are seen in the otherwise normal appearing spleen. A few cysts are seen in the left kidney, the largest in the interpolar region measuring 3.0 cm in diameter. There is a small hiatal hernia. Soft Tissues/Bones: No acute bone or soft tissue abnormality.     1. No evidence of pulmonary embolism or acute pulmonary abnormality. 2. Moderate coronary calcification. 3. Small hiatal hernia.     XR CHEST 1 VIEW    Result Date: 05/23/2023  EXAMINATION: ONE XRAY VIEW  OF THE CHEST 05/23/2023 4:55 pm COMPARISON: 06/23/2022 HISTORY: ORDERING SYSTEM PROVIDED HISTORY: chf TECHNOLOGIST PROVIDED HISTORY: Reason for exam:->chf FINDINGS: Mildly low lung volumes.  Mild central bronchial wall thickening and chronic interstitial coarsening.  Bibasilar subsegmental atelectasis or scarring. Atherosclerotic vascular calcifications.  Cardiac silhouette borderline enlarged, accentuated by portable projection.   Chronic findings without appreciable acute process.       HOSPITAL COURSE:   Presented to [facility] for evaluation of decompensated CHF and chest pain.  Cardiology team provided consultation and stress test was performed which was negative for any acute ischemic changes.  He compensated from a volume standpoint  and was transition back to oral home regimen.  There was felt to be a component of viral pneumonia or bronchitis however workup thus far has been negative.  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 Chronic comorbidities, labs and vital signs were monitored and addressed accordingly throughout the hospitalization. Patient is medically stable and acceptable for discharge today.    BRIEF PHYSICAL EXAMINATION AND LABORATORIES ON DAY OF DISCHARGE:  VITALS:  BP (!) 154/71   Pulse (!) 18   Temp 97.2 F (36.2 C) (Temporal)   Resp 18   Ht 1.702 m (5\' 7" )   Wt 100.7 kg (222 lb)   SpO2 97%   BMI 34.77 kg/m     HEENT:  PERRLA.  EOMI.  Sclera clear.  Buccal mucosa moist.    Neck:  Supple. Trachea midline. No thyromegaly. No JVD. No bruits.    Heart:  Rhythm regular, rate controlled.  S1 and S2.    Lungs:  Symmetrical.  Now with normal air exchange.  Clear to auscultation bilaterally.  No wheezes. No rhonchi. No rales.    Abdomen: Soft. Non-tender. Non-distended. Bowel sounds positive. No organomegaly or masses.  No pain on palpation    Extremities:  Peripheral pulses present.  No significant  pitting peripheral edema.  No ulcers.    Neurologic:  Alert x 3.  No focal deficit.  Cranial nerves grossly intact.    Skin:  No petechia. No hemorrhage. No wounds.      DISPOSITION:  The patient's condition is stable.  At this time the patient is without objective evidence of an acute process requiring continuing hospitalization or inpatient management.  They are stable for discharge with outpatient follow-up.    I have spoken with the patient and discussed the results of the current hospitalization, in addition to providing specific details for the plan of care and counseling regarding the diagnosis and prognosis.  The plan has been discussed in detail and they are aware of the specific conditions for emergent return, as well as the importance of follow-up.  Their questions are answered at this time and they are agreeable with the plan for discharge to home    DISCHARGE MEDICATIONS:   Current Discharge Medication List             Details   albuterol sulfate HFA (VENTOLIN HFA) 108 (90 Base) MCG/ACT inhaler Inhale 2 puffs into the lungs 4 times daily as needed for Wheezing  Qty: 18 g, Refills: 0      budesonide-formoterol (SYMBICORT) 160-4.5 MCG/ACT AERO Inhale 2 puffs into the lungs 2 times daily .  Rinse mouth and spit after each use.  Qty: 10.2 g, Refills: 1      levoFLOXacin (LEVAQUIN) 500 MG tablet Take 1 tablet by mouth daily for 3 doses  Qty: 3 tablet, Refills: 0      benzonatate (TESSALON) 200 MG capsule Take 1 capsule by mouth 3 times daily as needed for Cough  Qty: 30 capsule, Refills: 0                Details   predniSONE (DELTASONE) 10 MG tablet Take 4 tablets by mouth daily (with breakfast) for 3 days, THEN 3 tablets daily (with breakfast) for 3 days, THEN 2 tablets daily (with breakfast) for 3 days, THEN 1 tablet daily (with breakfast) for 3 days, THEN 0.5 tablets daily (with breakfast).  Qty: 45 tablet, Refills: 0  Details   celecoxib (CELEBREX) 200 MG capsule Take 1 capsule by mouth  nightly      potassium chloride (KLOR-CON M) 10 MEQ extended release tablet Take 1 tablet by mouth three times a week Sunday, Tuesday, Thursday      atorvastatin (LIPITOR) 20 MG tablet Take 1 tablet by mouth nightly  Qty: 90 tablet, Refills: 0    Associated Diagnoses: Hyperlipidemia, unspecified hyperlipidemia type      bumetanide (BUMEX) 1 MG tablet Take 1 tablet by mouth daily  Qty: 90 tablet, Refills: 0      losartan (COZAAR) 100 MG tablet Take 1 tablet by mouth daily  Qty: 90 tablet, Refills: 0    Associated Diagnoses: Primary hypertension      omeprazole (PRILOSEC) 20 MG delayed release capsule Take 1 capsule by mouth daily  Qty: 90 capsule, Refills: 0             FOLLOW UP/INSTRUCTIONS:  This patient is instructed to follow-up with his primary care physician.  Patient is instructed to follow-up with the consults listed above as directed by them.  he is instructed to resume home medications and take new medications as indicated in the list above.  If the patient has a recurrence of symptoms, he is instructed to go to the ED.    Preparing for this patient's discharge, including paperwork, orders, instructions, and meeting with patient did require > 40 minutes.    Elberta Fortis, APRN - CNP     05/26/2023  8:22 AM

## 2023-05-27 ENCOUNTER — Inpatient Hospital Stay
Admit: 2023-05-27 | Discharge: 2023-05-27 | Disposition: A | Discharge status: Home / Self Care | Payer: MEDICARE | Attending: Student in an Organized Health Care Education/Training Program

## 2023-05-27 ENCOUNTER — Emergency Department: Admit: 2023-05-27 | Payer: MEDICARE | Primary: Internal Medicine

## 2023-05-27 DIAGNOSIS — R109 Unspecified abdominal pain: Secondary | ICD-10-CM

## 2023-05-27 LAB — CBC WITH AUTO DIFFERENTIAL
Basophils %: 0 % (ref 0.0–2.0)
Basophils Absolute: 0.03 10*3/uL (ref 0.00–0.20)
Eosinophils %: 0 % (ref 0–6)
Eosinophils Absolute: 0 10*3/uL — ABNORMAL LOW (ref 0.05–0.50)
Hematocrit: 42.7 % (ref 37.0–54.0)
Hemoglobin: 14 g/dL (ref 12.5–16.5)
Immature Granulocytes %: 1 % (ref 0.0–5.0)
Immature Granulocytes Absolute: 0.06 10*3/uL (ref 0.00–0.58)
Lymphocytes %: 19 % — ABNORMAL LOW (ref 20.0–42.0)
Lymphocytes Absolute: 2.22 10*3/uL (ref 1.50–4.00)
MCH: 28 pg (ref 26.0–35.0)
MCHC: 32.8 g/dL (ref 32.0–34.5)
MCV: 85.4 fL (ref 80.0–99.9)
MPV: 9.9 fL (ref 7.0–12.0)
Monocytes %: 7 % (ref 2.0–12.0)
Monocytes Absolute: 0.83 10*3/uL (ref 0.10–0.95)
Neutrophils %: 74 % (ref 43.0–80.0)
Neutrophils Absolute: 8.78 10*3/uL — ABNORMAL HIGH (ref 1.80–7.30)
Platelets: 232 10*3/uL (ref 130–450)
RBC: 5 m/uL (ref 3.80–5.80)
RDW: 14.3 % (ref 11.5–15.0)
WBC: 11.9 10*3/uL — ABNORMAL HIGH (ref 4.5–11.5)

## 2023-05-27 LAB — COMPREHENSIVE METABOLIC PANEL W/ REFLEX TO MG FOR LOW K
ALT: 19 U/L (ref 0–40)
AST: 28 U/L (ref 0–39)
Albumin: 4 g/dL (ref 3.5–5.2)
Alkaline Phosphatase: 73 U/L (ref 40–129)
Anion Gap: 12 mmol/L (ref 7–16)
BUN: 36 mg/dL — ABNORMAL HIGH (ref 6–23)
CO2: 29 mmol/L (ref 22–29)
Calcium: 8.8 mg/dL (ref 8.6–10.2)
Chloride: 97 mmol/L — ABNORMAL LOW (ref 98–107)
Creatinine: 1.2 mg/dL (ref 0.70–1.20)
Est, Glom Filt Rate: 65 mL/min/{1.73_m2} (ref >60)
Glucose: 101 mg/dL — ABNORMAL HIGH (ref 74–99)
Potassium: 3.8 mmol/L (ref 3.5–5.0)
Sodium: 138 mmol/L (ref 132–146)
Total Bilirubin: 0.5 mg/dL (ref 0.0–1.2)
Total Protein: 7.3 g/dL (ref 6.4–8.3)

## 2023-05-27 LAB — LIPASE: Lipase: 19 U/L (ref 13–60)

## 2023-05-27 LAB — LACTIC ACID: Lactic Acid: 1.2 mmol/L (ref 0.5–2.2)

## 2023-05-27 MED ORDER — HYDROMORPHONE HCL PF 1 MG/ML IJ SOLN
1 | Freq: Once | INTRAMUSCULAR | Status: AC
Start: 2023-05-27 — End: 2023-05-27
  Administered 2023-05-27: 06:00:00 1 mg via INTRAVENOUS

## 2023-05-27 MED ORDER — FENTANYL CITRATE (PF) 50 MCG/ML IJ SOLN
50 | Freq: Once | INTRAMUSCULAR | Status: AC
Start: 2023-05-27 — End: 2023-05-27
  Administered 2023-05-27: 05:00:00 50 ug via INTRAVENOUS

## 2023-05-27 MED ORDER — IOPAMIDOL 76 % IV SOLN
76 | Freq: Once | INTRAVENOUS | Status: AC | PRN
Start: 2023-05-27 — End: 2023-05-27
  Administered 2023-05-27: 06:00:00 75 mL via INTRAVENOUS

## 2023-05-27 MED ORDER — HYDROMORPHONE 0.5MG/0.5ML IJ SOLN
1 | Freq: Once | Status: DC
Start: 2023-05-27 — End: 2023-05-27

## 2023-05-27 MED ORDER — IPRATROPIUM-ALBUTEROL 0.5-2.5 (3) MG/3ML IN SOLN
Freq: Once | RESPIRATORY_TRACT | Status: AC
Start: 2023-05-27 — End: 2023-05-27
  Administered 2023-05-27: 09:00:00 2 via RESPIRATORY_TRACT

## 2023-05-27 MED FILL — FENTANYL CITRATE (PF) 50 MCG/ML IJ SOLN: 50 MCG/ML | INTRAMUSCULAR | Qty: 1

## 2023-05-27 MED FILL — IPRATROPIUM-ALBUTEROL 0.5-2.5 (3) MG/3ML IN SOLN: RESPIRATORY_TRACT | Qty: 6

## 2023-05-27 MED FILL — HYDROMORPHONE HCL 1 MG/ML IJ SOLN: 1 MG/ML | INTRAMUSCULAR | Qty: 1

## 2023-05-27 NOTE — Discharge Instructions (Signed)
If you develop worsening pain, vomiting, fevers go to emergency department for evaluation

## 2023-05-28 NOTE — Telephone Encounter (Signed)
Care Transitions Initial Follow Up Call    Outreach made within 2 business days of discharge: Yes    Patient: Jeffrey [name] Patient DOB: 1946/04/27   MRN: [id number]  Reason for Admission: There are no discharge diagnoses documented for the most recent discharge.  Discharge Date: 05/27/23       Spoke with: patient     Discharge department/facility: [facility]     TCM Interactive Patient Contact:  Was patient able to fill all prescriptions: Yes  Was patient instructed to bring all medications to the follow-up visit: Yes  Is patient taking all medications as directed in the discharge summary? Yes  Does patient understand their discharge instructions: Yes  Does patient have questions or concerns that need addressed prior to 7-14 day follow up office visit: no      Scheduled appointment with PCP within 7-14 days    Follow Up  Future Appointments   Date Time Provider Department Center   05/29/2023  3:00 PM Ragheb, Sharlyn Bologna, MD [facility]   06/25/2023 10:00 AM Ragheb, Sharlyn Bologna, MD [facility]   08/06/2023 11:45 AM [name], MD [facility]       Caryl Ada, MA

## 2023-05-29 ENCOUNTER — Ambulatory Visit: Admit: 2023-05-29 | Discharge: 2023-05-29 | Payer: MEDICARE | Attending: Internal Medicine | Primary: Internal Medicine

## 2023-05-29 VITALS — BP 112/82 | HR 55 | Temp 97.90000°F | Ht 67.01 in | Wt 225.2 lb

## 2023-05-29 DIAGNOSIS — I509 Heart failure, unspecified: Principal | ICD-10-CM

## 2023-05-29 MED ORDER — BUMETANIDE 1 MG PO TABS
1 MG | ORAL_TABLET | Freq: Two times a day (BID) | ORAL | 0 refills | Status: AC
Start: 2023-05-29 — End: 2023-08-27

## 2023-05-29 NOTE — Progress Notes (Signed)
Chief Complaint   Patient presents with    Follow-Up from Hospital     Patient was dx with pneumonia possibly, still not feeling well, covid and flu were both negative several times.  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  He is breathing better although not completely normal still sleeps on the recliner at night  Complains of left upper quadrant pain upon deep coughing he has abdominal hernia in the area  .    Past Medical History, Surgical History, and Family History has been reviewed and updated.    Review of Systems:  Constitutional:  No fever, no fatigue, no chills, no headaches, no weight change  Dermatology:  No rash, no mole, no dry or sensitive skin  ENT:  No cough, no sore throat, no sinus pain, no runny nose, no ear pain  Cardiology:  No chest pain, no palpitations, no leg edema, no shortness of breath, no PND  Gastroenterology:  No dysphagia, no abdominal pain, no nausea, no vomiting, no constipation, no diarrhea, no heartburn  Musculoskeletal:  No joint pain, no leg cramps, no back pain, no muscle aches  Respiratory:  No shortness of breath, no orthopnea, no wheezing, no DOE, no hemoptysis  Urology:  No blood in the urine, no urinary frequency, no urinary incontinence, no urinary urgency, no nocturia, no dysuria    Vitals:    05/29/23 1442   BP: 112/82   Site: Right Upper Arm   Position: Sitting   Cuff Size: Medium Adult   Pulse: 55   Temp: 97.9 F (36.6 C)   TempSrc: Temporal   SpO2: 96%   Weight: 102.2 kg (225 lb 3.2 oz)   Height: 1.702 m (5' 7.01")       General:  Patient alert and oriented x  3, NAD, pleasant  HEENT:  Atraumatic, normocephalic, PERRLA, EOMI, clear conjunctiva, TMs clear, nose-clear, throat - no erythema  Neck:  Supple, no goiter, no carotid bruits, no LAD  Lungs: Scattered bilateral expiratory rhonchi throughout both lung fields  Heart:  RRR, no murmurs, gallops or rubs  Abdomen:  Soft/nt/nd, + bowel sounds  Lymph node examination: unremarkable  Neurological exam : unremarkable  Extremities:  No clubbing, cyanosis +1 bilateral leg edema  Skin: unremarkable    Hemoglobin A1C   Date Value Ref Range Status   09/05/2019 5.3 4.0 - 5.6 % Final     Cholesterol, Total   Date Value Ref Range Status   05/24/2023 126 <200 mg/dL Final     Triglycerides   Date Value Ref Range Status   05/24/2023 51 <150 mg/dL Final     HDL   Date Value Ref Range Status   05/24/2023 46 >40 mg/dL Final     VLDL   Date Value Ref Range Status   05/24/2023 10 mg/dL Final     Comment:     No normal range established.     Sodium   Date Value Ref Range Status   05/27/2023 138 132 - 146 mmol/L Final     Potassium   Date Value Ref Range Status   05/27/2023 3.8 3.5 - 5.0 mmol/L  Final     Chloride   Date Value Ref Range Status   05/27/2023 97 (L) 98 - 107 mmol/L Final     CO2   Date Value Ref Range Status   05/27/2023 29 22 - 29 mmol/L Final     BUN   Date Value Ref Range Status   05/27/2023 36 (H) 6 - 23 mg/dL Final     Creatinine   Date Value Ref Range Status   05/27/2023 1.2 0.70 - 1.20 mg/dL Final     Glucose   Date Value Ref Range Status   05/27/2023 101 (H) 74 - 99 mg/dL Final     Calcium   Date Value Ref Range Status   05/27/2023 8.8 8.6 - 10.2 mg/dL Final     Total Bilirubin   Date Value Ref Range Status   05/27/2023 0.5 0.0 - 1.2 mg/dL Final     Alkaline Phosphatase   Date Value Ref Range Status   05/27/2023 73 40 - 129 U/L Final     AST   Date Value Ref Range Status   05/27/2023 28 0 - 39 U/L Final     ALT   Date Value Ref Range Status   05/27/2023 19 0 - 40 U/L Final     Est, Glom Filt Rate   Date Value Ref Range  Status   05/27/2023 65 >60 mL/min/1.58m2 Final     Comment:           These results are not intended for use in patients <57 years of age.        eGFR results are calculated without a race factor using the 2021 CKD-EPI equation.  Careful clinical correlation is recommended, particularly when comparing to results   calculated using previous equations.  The CKD-EPI equation is less accurate in patients with extremes of muscle mass, extra-renal   metabolism of creatine, excessive creatine ingestion, or following therapy that affects   renal tubular secretion.       GFR African American   Date Value Ref Range Status   10/04/2021 >60  Final        CT ABDOMEN PELVIS W IV CONTRAST Additional Contrast? None    Result Date: 05/27/2023  EXAMINATION: CT OF THE ABDOMEN AND PELVIS WITH CONTRAST 05/27/2023 2:17 am TECHNIQUE: CT of the abdomen and pelvis was performed with the administration of intravenous contrast. Multiplanar reformatted images are provided for review. Automated exposure control, iterative reconstruction, and/or weight based adjustment of the mA/kV was utilized to reduce the radiation dose to as low as reasonably achievable. COMPARISON: None. HISTORY: ORDERING SYSTEM PROVIDED HISTORY: Left upper quadrant pain TECHNOLOGIST PROVIDED HISTORY: Reason for exam:->Left upper quadrant pain Additional Contrast?->None Decision Support Exception - unselect if not a suspected or confirmed emergency medical condition->Emergency Medical Condition (MA) FINDINGS: Lower Chest:  Visualized portion of the lower chest demonstrates no acute abnormality.  Small hiatal hernia. Organs: The liver, gallbladder, spleen, pancreas, adrenals are within normal limits.  There are bilateral renal cysts.  No hydronephrosis. GI/Bowel:  There is no evidence of bowel obstruction.  No evidence of abnormal bowel wall thickening or distension.  The appendix is not visualized, likely absent.  There are scattered diverticula. Pelvis: The urinary bladder is  partially filled.  The prostate is unremarkable. Peritoneum/Retroperitoneum: No evidence of ascites or free air.  No evidence of lymphadenopathy.  Aorta is normal in caliber. Bones/Soft Tissues:  No acute abnormality of the visualized osseous structures.  No focal soft tissue abnormality.  1. No acute intra-abdominal or pelvic abnormality. 2. Small hiatal hernia. 3. Bilateral renal cysts. 4. Diverticulosis. No evidence of acute diverticulitis.     Nuclear stress test with myocardial perfusion    Result Date: 05/25/2023    Stress Test: A pharmacological stress test was performed using regadenoson Eugenie Birks). PO caffeine given as a reversal agent. The patient reported no symptoms during the stress test. The patient reached the end of the protocol.   ECG: The stress ECG was negative for ischemia.   Stress Combined Conclusion: Normal pharmacological myocardial perfusion study. Findings suggest a low risk of cardiac events.   Stress Function: Left ventricular function post-stress is normal. Post-stress ejection fraction is 54%.   Perfusion Comments: LV perfusion is normal. There is no evidence of inducible ischemia.  Inferior attenuation artifact present.   Perfusion Conclusion: There is no evidence of transient ischemic dilation (TID).   Image quality is good.     Echo (TTE) complete (PRN contrast/bubble/strain/3D)    Result Date: 05/24/2023    Image quality is suboptimal. Contrast used: Definity.   Left Ventricle: Normal left ventricular systolic function with a visually estimated EF of 60 - 65%. Left ventricle size is normal. Mildly increased wall thickness. Normal wall motion.   Right Ventricle: Right ventricle size is normal. Normal systolic function. TAPSE is 2.3 cm.   Left Atrium: Left atrial volume index is normal (16-34 mL/m2).   Mitral Valve: Mildly thickened leaflet. Trace regurgitation.   Tricuspid Valve: Trace regurgitation. The estimated RVSP is 35 mmHg.   Pericardium: No pericardial effusion.     CTA  PULMONARY W CONTRAST    Result Date: 05/23/2023  EXAMINATION: CTA OF THE CHEST 05/23/2023 5:40 pm TECHNIQUE: CTA of the chest was performed after the administration of intravenous contrast.  Multiplanar reformatted images are provided for review.  MIP images are provided for review. Automated exposure control, iterative reconstruction, and/or weight based adjustment of the mA/kV was utilized to reduce the radiation dose to as low as reasonably achievable. COMPARISON: None. HISTORY: ORDERING SYSTEM PROVIDED HISTORY: evaluate for PE TECHNOLOGIST PROVIDED HISTORY: Reason for exam:->evaluate for PE Additional Contrast?->1 FINDINGS: Pulmonary Arteries: Pulmonary arteries are adequately opacified for evaluation.  No evidence of intraluminal filling defect to suggest pulmonary embolism.  Main pulmonary artery is normal in caliber. Mediastinum: No evidence of mediastinal lymphadenopathy. There is moderate coronary calcification.  The heart is normal in size. There is no acute abnormality of the thoracic aorta.  There is moderate tortuosity of the thoracic aorta without aneurysmal dilatation. Lungs/pleura: The lungs are without acute process.  No focal consolidation or pulmonary edema.  No evidence of pleural effusion or pneumothorax. Upper Abdomen: Calcified granulomata are seen in the otherwise normal appearing spleen. A few cysts are seen in the left kidney, the largest in the interpolar region measuring 3.0 cm in diameter. There is a small hiatal hernia. Soft Tissues/Bones: No acute bone or soft tissue abnormality.     1. No evidence of pulmonary embolism or acute pulmonary abnormality. 2. Moderate coronary calcification. 3. Small hiatal hernia.     XR CHEST 1 VIEW    Result Date: 05/23/2023  EXAMINATION: ONE XRAY VIEW OF THE CHEST 05/23/2023 4:55 pm COMPARISON: 06/23/2022 HISTORY: ORDERING SYSTEM PROVIDED HISTORY: chf TECHNOLOGIST PROVIDED HISTORY: Reason for exam:->chf FINDINGS: Mildly low lung volumes.  Mild central  bronchial wall thickening and chronic interstitial coarsening.  Bibasilar subsegmental atelectasis or scarring. Atherosclerotic vascular calcifications.  Cardiac silhouette borderline enlarged, accentuated by portable projection.  Chronic findings without appreciable acute process.        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  Viral pneumonia.  Continue Symbicort twice daily albuterol 4 times a day as needed Mucinex 1 tablet twice daily over-the-counter and tapering dose of prednisone  Essential hypertension controlled  Abdominal wall pain with heavy cough, patient was asked to support his abdomen with his hand during cough  Essential hypertension controlled    Outpatient Encounter Medications as of 05/29/2023   Medication Sig Dispense Refill    bumetanide (BUMEX) 1 MG tablet Take 1 tablet by mouth in the morning and at bedtime 180 tablet 0    albuterol sulfate HFA (VENTOLIN HFA) 108 (90 Base) MCG/ACT inhaler Inhale 2 puffs into the lungs 4 times daily as needed for Wheezing 18 g 0    budesonide-formoterol (SYMBICORT) 160-4.5 MCG/ACT AERO Inhale 2 puffs into the lungs 2 times daily .  Rinse mouth and spit after each use. 10.2 g 1    predniSONE (DELTASONE) 10 MG tablet Take 4 tablets by mouth daily (with breakfast) for 3 days, THEN 3 tablets daily (with breakfast) for 3 days, THEN 2 tablets daily (with breakfast) for 3 days, THEN 1 tablet daily (with breakfast) for 3 days, THEN 0.5 tablets daily (with breakfast). 45 tablet 0    benzonatate (TESSALON) 200 MG capsule Take 1 capsule by mouth 3 times daily as needed for Cough 30 capsule 0    celecoxib (CELEBREX) 200 MG capsule Take 1 capsule by mouth nightly      potassium chloride (KLOR-CON M) 10 MEQ  extended release tablet Take 1 tablet by mouth three times a week Sunday, Tuesday, Thursday      atorvastatin (LIPITOR) 20 MG tablet Take 1 tablet by mouth nightly 90 tablet 0    losartan (COZAAR) 100 MG tablet Take 1 tablet by mouth daily 90 tablet 0    omeprazole (PRILOSEC) 20 MG delayed release capsule Take 1 capsule by mouth daily 90 capsule 0    [DISCONTINUED] levoFLOXacin (LEVAQUIN) 500 MG tablet Take 1 tablet by mouth daily for 3 doses (Patient not taking: Reported on 05/29/2023) 3 tablet 0    [DISCONTINUED] bumetanide (BUMEX) 1 MG tablet Take 1 tablet by mouth daily 90 tablet 0     No facility-administered encounter medications on file as of 05/29/2023.        Alaster was seen today for follow-up from hospital.    Diagnoses and all orders for this visit:    Acute congestive heart failure, unspecified heart failure type (HCC)  -     bumetanide (BUMEX) 1 MG tablet; Take 1 tablet by mouth in the morning and at bedtime  -     CBC; Future  -     Comprehensive Metabolic Panel; Future    Primary hypertension    Viral pneumonia  -     CBC; Future  -     Comprehensive Metabolic Panel; Future    Severe obesity (BMI 35.0-39.9) with comorbidity (HCC)         There are no Patient Instructions on file for this visit.     On this date 05/29/2023 I have spent 35 minutes  reviewing previous notes, test results and face to face with the patient discussing the diagnosis and importance of compliance with the treatment plan as well as documenting on the day of the visit.      [name], MD   05/29/23

## 2023-05-29 NOTE — Care Coordination-Inpatient (Signed)
Care Transitions Note    Initial Call - Call within 2 business days of discharge: Yes     Attempted to reach patient for transitions of care follow up. Unable to reach patient.    Outreach Attempts:   HIPAA compliant voicemail left for patient.     Patient: Jeffrey [name]    Patient DOB: 1946-08-29   MRN: [id number]    Reason for Admission: Acute diastolic CHF  Discharge Date: 04/27/08  RURS: Readmission Risk Score: 8.6    Last Discharge Facility       Date Complaint Diagnosis Description Type Department Provider    05/27/23 Abdominal Pain Abdominal wall pain ED (DISCHARGE) [facility] Aggie Moats, DO          Was this an external facility discharge? No    Attempted to contact patient today 05/29/23 for initial TOC/hospital discharge f/u and ED discharge f/u. Let message on home/mobile number requesting a return call back to CTN and provided contact information.     Cleta Alberts, APRN

## 2023-05-30 NOTE — Care Coordination-Inpatient (Signed)
Care Transitions Note    Initial Call - Call within 2 business days of discharge: Yes     Patient Current Location:  Home: [address]  [place] [address]    Care Transition Nurse contacted the patient by telephone to perform post hospital discharge assessment, verified name and DOB as identifiers. Provided introduction to self, and explanation of the Care Transition Nurse role.     Patient: Jeffrey [name]    Patient DOB: 05-17-1946   MRN: [id number]    Reason for Admission: Acute diastolic chf  Discharge Date: 12/25/89  RURS: Readmission Risk Score: 8.6      Last Discharge Facility       Date Complaint Diagnosis Description Type Department Provider    05/27/23 Abdominal Pain Abdominal wall pain ED (DISCHARGE) [facility] Aggie Moats, DO            Was this an external facility discharge? No    Additional needs identified to be addressed with provider   No needs identified             Method of communication with provider: none.    Patients top risk factors for readmission: level of motivation, medical condition-HTN, CHF, AFib, and polypharmacy    Interventions to address risk factors:   Education: Discussed CHF zone tool and knowing when to call doctor or seek medical attention. Encouraged to monitor/document daily weight and watch for 2-3 lbs/day and/or 5 lbs/week and call doctor for rapid weight gain. Encourage to follow low sodium diet as part of HF management. Pt declines dietician referral.     Care Summary Note: Spoke with patient today 05/30/23 for initial TOC/hospital discharge (low risk) follow up. Confirmed patient was admitted to [facility] for acute diastolic CHF and tested positive for parainfluenza.     Patient states feeling better since discharge but has ongoing cough. States getting relief from [employer]. Denis any shortness of breath and admits getting relief from inhalers. States last use of Albuterol rescue inhaler was 10 am this morning which helps. Advised to rinse mouth out after using inhalers to  prevent thrush. Noted CTA obtained on admission showed the following:     IMPRESSION:  1. No evidence of pulmonary embolism or acute pulmonary abnormality.  2. Moderate coronary calcification.  3. Small hiatal hernia.    Patient denies any chest pain, chest discomfort, nausea, vomiting, diarrhea, chills or fever. Confirmed with patient he finished the 3 doses of Levaquin that was ordered on discharge.     Patient denies any LE swelling and doe snot check weight at home but agrees moving forward. CTN encouraged to monitor/document daily weight and watch for 2-3 lbs/day and/or 5 lbs/week and call doctor for rapid weight gain.     Encouraged to follow low sodium diet as part of CHF management. Declines dietician referral.     Provided a complete review of home meds with patient who confirms he obtained all new meds ordered on discharge. 1111F code NOT entered as patient completed HFU appt with PCP yesterday before CTN call.     Patient denies any other complaints or needs and is receptive to subsequent follow up. CTN will continue to follow for Care Transition.     Care Transition Nurse reviewed discharge instructions, medical action plan, and red flags with patient. The patient was given an opportunity to ask questions; all questions answered at this time.. The patient verbalized understanding.   Were discharge instructions available to patient? Yes.  Reviewed appropriate site of care based on symptoms and resources available to patient including: PCP  When to call 911  Condition related references. The patient agrees to contact the primary care provider and/or specialist office for questions related to their healthcare.      Advance Care Planning:   Does patient have an Advance Directive: reviewed and current.    Medication Reconciliation:  Medication reconciliation was performed with patient,1111F entered: Patient completed HFU appt with  PCP yesterday before completion of CTN call. .     Remote Patient  Monitoring:  Offered patient enrollment in the Remote Patient Monitoring (RPM) program for in-home monitoring: Yes, but did not enroll at this time: Did not discuss on initial call.  .    Assessments:  Care Transitions 24 Hour Call    Schedule Follow Up Appointment with PCP: Declined  Do you have a copy of your discharge instructions?: Yes  Do you have all of your prescriptions and are they filled?: Yes  Have you been contacted by a [facility] Pharmacist?: No  Have you scheduled your follow up appointment?: Yes  How are you going to get to your appointment?: Car - drive self  Do you have support at home?: Partner/Spouse/SO  Do you feel like you have everything you need to keep you well at home?: Yes  Care Transitions Interventions    Registered Dietician: Declined              Follow Up Appointment:   Discussed follow up appointments. Patient has hospital follow up appointment scheduled within 7 days of discharge.   Future Appointments         Provider Specialty Dept Phone    06/21/2023 11:45 AM [name], MD Primary Care [phone]    06/25/2023 10:00 AM [name], MD Primary Care [phone]    08/06/2023 11:45 AM [name], MD Cardiology [phone]            Care Transition Nurse provided contact information.  Plan for follow-up call in 6-10 days based on severity of symptoms and risk factors.  Plan for next call:  symptom check      Cleta Alberts, APRN

## 2023-06-19 NOTE — Care Coordination-Inpatient (Signed)
Care Transitions Note    Follow Up Call     Attempted to reach patient for transitions of care follow up.  Unable to reach patient.      Outreach Attempts:   HIPAA compliant voicemail left for patient.     Care Summary Note: Attempted to contact patient today 06/19/23 for TOC/hospital discharge (loe risk) follow up sub call. Left message on home/mobile number requesting a return call back to CTN and provided contact information.     Follow Up Appointment:   Future Appointments         Provider Specialty Dept Phone    07/05/2023 10:30 AM [name], MD Primary Care [phone]    08/06/2023 11:45 AM [name], MD Cardiology [phone]            Cleta Alberts, APRN

## 2023-06-21 ENCOUNTER — Encounter: Payer: MEDICARE | Attending: Internal Medicine | Primary: Internal Medicine

## 2023-06-25 ENCOUNTER — Encounter: Payer: MEDICARE | Attending: Internal Medicine | Primary: Internal Medicine

## 2023-06-26 NOTE — Care Coordination-Inpatient (Signed)
Care Transitions Note    Final Call     Attempted to reach patient for transitions of care follow up.  Unable to reach patient.      Outreach Attempts:   Multiple attempts to contact patient at phone numbers on file.     Patient graduated from the Care Transitions program on 06/26/23.      Care Summary Note: Second and final attempt made today to contact patient for TOC/hospital discharge follow up. Left message on home/mobile number requesting a return call back to CTN and provided contact information.     Noted patient completed HFU appt with Dr. Shona Simpson (PCP) on 05/29/23. CTN signing off if no return call.       Upcoming Appointments:    Future Appointments         Provider Specialty Dept Phone    07/05/2023 10:30 AM [name], MD Primary Care [phone]    08/06/2023 11:45 AM [name], MD Cardiology [phone]            Cleta Alberts, APRN

## 2023-07-03 ENCOUNTER — Inpatient Hospital Stay: Payer: MEDICARE | Primary: Internal Medicine

## 2023-07-03 DIAGNOSIS — I509 Heart failure, unspecified: Principal | ICD-10-CM

## 2023-07-05 ENCOUNTER — Ambulatory Visit: Admit: 2023-07-05 | Discharge: 2023-07-05 | Payer: MEDICARE | Attending: Internal Medicine | Primary: Internal Medicine

## 2023-07-05 VITALS — BP 136/80 | HR 80 | Temp 97.50000°F | Ht 67.01 in | Wt 225.8 lb

## 2023-07-05 DIAGNOSIS — I509 Heart failure, unspecified: Principal | ICD-10-CM

## 2023-07-05 MED ORDER — TIZANIDINE HCL 4 MG PO TABS
4 | ORAL_TABLET | Freq: Every evening | ORAL | 0 refills | Status: DC
Start: 2023-07-05 — End: 2023-08-16

## 2023-07-05 NOTE — Progress Notes (Signed)
Chief Complaint   Patient presents with    Follow-up     Pt was in-pt 05/23/23-05/26/23 for pneumonia and went back to ER 6/2 for severe chest pain.       HPI:  Patient is here for follow-up .  Patient feels much better cough subsided no shortness of breath no orthopnea no leg swelling.  Feels generally tired energy level not like it was before hospitalization  No chest or abdominal pain.  Currently taking prednisone 5 mg  daily  Denied morning muscle stiffness.  Complains of neck pain when turning to either side.    Past Medical History, Surgical History, and Family History has been reviewed and updated.    Review of Systems:  Constitutional:  No fever, no fatigue, no chills, no headaches, no weight change  Dermatology:  No rash, no mole, no dry or sensitive skin  ENT:  No cough, no sore throat, no sinus pain, no runny nose, no ear pain  Cardiology:  No chest pain, no palpitations, no leg edema, no shortness of breath, no PND  Gastroenterology:  No dysphagia, no abdominal pain, no nausea, no vomiting, no constipation, no diarrhea, no heartburn  Musculoskeletal:  No joint pain, no leg cramps, no back pain, no muscle aches  Respiratory:  No shortness of breath, no orthopnea, no wheezing, no DOE, no hemoptysis  Urology:  No blood in the urine, no urinary frequency, no urinary incontinence, no urinary urgency, no nocturia, no dysuria    Vitals:    07/05/23 1014   BP: 136/80   Site: Left Upper Arm   Pulse: 80   Temp: 97.5 F (36.4 C)   SpO2: 96%   Weight: 102.4 kg (225 lb 12.8 oz)   Height: 1.702 m (5' 7.01")       General:  Patient alert and oriented x 3, NAD, pleasant  HEENT:  Atraumatic, normocephalic, PERRLA, EOMI, clear conjunctiva, TMs clear, nose-clear, throat - no erythema  Neck:  Supple, no goiter, no carotid bruits, no LAD+ bilateral cervical muscle spasm+ muscle tenderness+ pain to either side  Lungs:  CTA   Heart:  RRR, no murmurs, gallops or rubs  Abdomen:  Soft/nt/nd, + bowel sounds  Lymph node  examination: unremarkable  Neurological exam : unremarkable  Extremities:  No clubbing, cyanosis or edema  Skin: unremarkable    Hemoglobin A1C   Date Value Ref Range Status   09/05/2019 5.3 4.0 - 5.6 % Final     Cholesterol, Total   Date Value Ref Range Status   05/24/2023 126 <200 mg/dL Final     Triglycerides   Date Value Ref Range Status   05/24/2023 51 <150 mg/dL Final     HDL   Date Value Ref Range Status   05/24/2023 46 >40 mg/dL Final     VLDL   Date Value Ref Range Status   05/24/2023 10 mg/dL Final     Comment:     No normal range established.     Sodium   Date Value Ref Range Status   07/03/2023 141 132 - 146 mmol/L Final     Potassium   Date Value Ref Range Status   07/03/2023 3.7 3.5 - 5.0 mmol/L Final     Chloride   Date Value Ref Range Status   07/03/2023 100 98 - 107 mmol/L Final     CO2   Date Value Ref Range Status   07/03/2023 29 22 - 29 mmol/L Final     BUN  Date Value Ref Range Status   07/03/2023 25 (H) 6 - 23 mg/dL Final     Creatinine   Date Value Ref Range Status   07/03/2023 1.0 0.70 - 1.20 mg/dL Final     Glucose   Date Value Ref Range Status   07/03/2023 96 74 - 99 mg/dL Final     Calcium   Date Value Ref Range Status   07/03/2023 8.8 8.6 - 10.2 mg/dL Final     Total Bilirubin   Date Value Ref Range Status   07/03/2023 0.3 0.0 - 1.2 mg/dL Final     Alkaline Phosphatase   Date Value Ref Range Status   07/03/2023 50 40 - 129 U/L Final     AST   Date Value Ref Range Status   07/03/2023 14 0 - 39 U/L Final     ALT   Date Value Ref Range Status   07/03/2023 16 0 - 40 U/L Final     Est, Glom Filt Rate   Date Value Ref Range Status   07/03/2023 80 >60 mL/min/1.85m2 Final     Comment:           These results are not intended for use in patients <45 years of age.        eGFR results are calculated without a race factor using the 2021 CKD-EPI equation.  Careful clinical correlation is recommended, particularly when comparing to results   calculated using previous equations.  The CKD-EPI equation  is less accurate in patients with extremes of muscle mass, extra-renal   metabolism of creatine, excessive creatine ingestion, or following therapy that affects   renal tubular secretion.       GFR African American   Date Value Ref Range Status   10/04/2021 >60  Final        No results found.     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  Cardiac rehab program  Viral pneumonia subsided  Taper off  prednisone 0.5 tablet every other day x 7 days then 0.5 tablet twice a week for 7 days then discontinue.  Essential hypertension controlled  Abdominal wall pain subsided  Essential hypertension controlled  PMR controlled  Cervical muscle spasm.  Zanaflex 4 mg tablet nightly.  Heat application and massage.  Paroxysmal atrial fibrillation status post Watchman placement.  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  Rinse mouth and spit after each use. 10.2 g 1    potassium chloride (KLOR-CON M) 10 MEQ extended release tablet Take 1 tablet by mouth three times a week Sunday, Tuesday, Thursday      atorvastatin (LIPITOR) 20 MG tablet Take 1 tablet by mouth nightly 90 tablet 0    losartan (COZAAR) 100 MG tablet Take 1 tablet by mouth daily 90 tablet  0    omeprazole (PRILOSEC) 20 MG delayed release capsule Take 1 capsule by mouth daily 90 capsule 0    albuterol sulfate HFA (VENTOLIN HFA) 108 (90 Base) MCG/ACT  inhaler Inhale 2 puffs into the lungs 4 times daily as needed for Wheezing (Patient not taking: Reported on 07/05/2023) 18 g 0    [DISCONTINUED] predniSONE (DELTASONE) 10 MG tablet Take 4 tablets by mouth daily (with breakfast) for 3 days, THEN 3 tablets daily (with breakfast) for 3 days, THEN 2 tablets daily (with breakfast) for 3 days, THEN 1 tablet daily (with breakfast) for 3 days, THEN 0.5 tablets daily (with breakfast). 45 tablet 0    [DISCONTINUED] celecoxib (CELEBREX) 200 MG capsule Take 1 capsule by mouth nightly (Patient not taking: Reported on 07/05/2023)       No facility-administered encounter medications on file as of 07/05/2023.        Jeffrey [name] was seen today for follow-up.    Diagnoses and all orders for this visit:    Paroxysmal atrial fibrillation (HCC)  -     [place] [facility], [facility]    Acute on chronic congestive heart failure, unspecified heart failure type [facility])  -     [place] - [facility], [facility]  -     Sedimentation Rate; Future  -     Comprehensive Metabolic Panel; Future    Class 2 severe obesity due to excess calories with serious comorbidity and body mass index (BMI) of 35.0 to 35.9 in adult [facility])  -     [place] - Dhital, Shreebatsa, MD, Bariatrics, [facility]    Cervical paraspinal muscle spasm  -     tiZANidine (ZANAFLEX) 4 MG tablet; Take 1 tablet by mouth at bedtime    PMR (polymyalgia rheumatica) (HCC)  -     Sedimentation Rate; Future         There are no Patient Instructions on file for this visit.     On this date 07/05/2023 I have spent 30 minutes reviewing previous notes, test results and face to face with the patient discussing the diagnosis and importance of compliance with the treatment plan as well as documenting on the day of the visit.      [name], MD   07/05/23

## 2023-08-06 ENCOUNTER — Encounter: Payer: MEDICARE | Attending: Internal Medicine | Primary: Internal Medicine

## 2023-08-06 ENCOUNTER — Ambulatory Visit: Admit: 2023-08-06 | Discharge: 2023-08-06 | Payer: MEDICARE | Attending: Internal Medicine | Primary: Internal Medicine

## 2023-08-06 DIAGNOSIS — I509 Heart failure, unspecified: Secondary | ICD-10-CM

## 2023-08-06 MED ORDER — METOPROLOL SUCCINATE ER 25 MG PO TB24
25 MG | ORAL_TABLET | Freq: Every day | ORAL | 2 refills | Status: AC
Start: 2023-08-06 — End: 2023-08-13

## 2023-08-06 NOTE — Progress Notes (Signed)
[facility] Progress Note  Dr. [name]      Referring Physician: [name], MD  CHIEF COMPLAINT:   Chief Complaint   Patient presents with    Annual Exam       HISTORY OF PRESENT ILLNESS:   Patient is 77 years old male with history of atrial fibrillation s/p ablation, Watchman device implant, hypertension and bradycardia. Here for follow up appointment.  Patient denies any chest pain, no shortness of breath, no lightheadedness, no dizziness, no palpitations, no pedal edema, no PND, no orthopnea, no syncope, no presyncopal episodes.  Functional capacity is at baseline    Past Medical History:   Diagnosis Date    Atrial fibrillation (HCC)     Has a Watchman device    GERD (gastroesophageal reflux disease)     Hyperlipidemia     Hypertension     Sleep apnea     "uses the mouth piece"         Past Surgical History:   Procedure Laterality Date    APPENDECTOMY      CARDIAC SURGERY      watchman implant    COLONOSCOPY      HERNIA REPAIR           Current Outpatient Medications   Medication Sig Dispense Refill    Potassium Chloride 10 MEQ PACK Take 10 mEq by mouth every other day      celecoxib (CELEBREX) 200 MG capsule Take 1 capsule by mouth 2 times daily      tadalafil (CIALIS) 20 MG tablet Take 1 tablet by mouth as needed for Erectile Dysfunction      bumetanide (BUMEX) 1 MG tablet Take 1 tablet by mouth in the morning and at bedtime 180 tablet 0    atorvastatin (LIPITOR) 20 MG tablet Take 1 tablet by mouth nightly 90 tablet 0    losartan (COZAAR) 100 MG tablet Take 1 tablet by mouth daily 90 tablet 0    omeprazole (PRILOSEC) 20 MG delayed release capsule Take 1 capsule by mouth daily 90 capsule 0    tiZANidine (ZANAFLEX) 4 MG tablet Take 1 tablet by mouth at bedtime (Patient not taking: Reported on 08/06/2023) 30 tablet 0    albuterol sulfate HFA (VENTOLIN HFA) 108 (90 Base) MCG/ACT inhaler Inhale 2 puffs into the lungs 4 times daily as needed for Wheezing (Patient not taking: Reported on 07/05/2023) 18 g  0    budesonide-formoterol (SYMBICORT) 160-4.5 MCG/ACT AERO Inhale 2 puffs into the lungs 2 times daily .  Rinse mouth and spit after each use. (Patient not taking: Reported on 08/06/2023) 10.2 g 1    potassium chloride (KLOR-CON M) 10 MEQ extended release tablet Take 1 tablet by mouth three times a week Sunday, Tuesday, Thursday (Patient not taking: Reported on 08/06/2023)       No current facility-administered medications for this visit.         Allergies as of 08/06/2023 - Fully Reviewed 08/06/2023   Allergen Reaction Noted    Lidocaine Other (See Comments) 05/15/2023    Lidocaine-epinephrine Other (See Comments) 04/28/2021    Cephalexin Hives and Rash 04/28/2021    Sulfa antibiotics Rash 09/04/2019       Social History     Socioeconomic History    Marital status: Married     Spouse name: Not on file    Number of children: 1    Years of education: Not on file    Highest education level: Not on file  Occupational History    Not on file   Tobacco Use    Smoking status: Never    Smokeless tobacco: Never   Vaping Use    Vaping status: Never Used   Substance and Sexual Activity    Alcohol use: Yes     Alcohol/week: 2.0 standard drinks of alcohol     Types: 2 Shots of liquor per week     Comment: 2 drinks vodka a day     Drug use: Never    Sexual activity: Yes   Other Topics Concern    Not on file   Social History Narrative    Not on file     Social Determinants of Health     Financial Resource Strain: Low Risk  (07/04/2023)    Overall Financial Resource Strain (CARDIA)     Difficulty of Paying Living Expenses: Not very hard   Food Insecurity: No Food Insecurity (07/04/2023)    Hunger Vital Sign     Worried About Running Out of Food in the Last Year: Never true     Ran Out of Food in the Last Year: Never true   Transportation Needs: No Transportation Needs (07/04/2023)    PRAPARE - Therapist, art (Medical): No     Lack of Transportation (Non-Medical): No   Physical Activity: Inactive (04/30/2023)     Exercise Vital Sign     Days of Exercise per Week: 0 days     Minutes of Exercise per Session: 0 min   Stress: Not on file   Social Connections: Not on file   Intimate Partner Violence: Not on file   Housing Stability: Low Risk  (07/04/2023)    Housing Stability Vital Sign     Unable to Pay for Housing in the Last Year: No     Number of Places Lived in the Last Year: 1     Unstable Housing in the Last Year: No       Family History   Problem Relation Age of Onset    Heart Attack Father        REVIEW OF SYSTEMS:   CONSTITUTIONAL:  negative for  fevers, chills, sweats and fatigue  HEENT:  negative for  tinnitus, earaches, nasal congestion and epistaxis  RESPIRATORY:  negative for  dry cough, cough with sputum,wheezing and hemoptysis  GASTROINTESTINAL:  negative for nausea, vomiting, diarrhea, constipation, pruritus and jaundice  HEMATOLOGIC/LYMPHATIC:  negative for easy bruising, bleeding, lymphadenopathy and petechiae  ENDOCRINE:  negative for heat intolerance, cold intolerance, tremor, hair loss and diabetic symptoms including neither polyuria nor polydipsia nor blurred vision  MUSCULOSKELETAL:  negative for  myalgias, arthralgias, joint swelling, stiff joints and decreased range of motion  NEUROLOGICAL:  negative for memory problems, speech problems, visual disturbance, dysphagia, weakness and numbness      PHYSICAL EXAM:   Constitutional:  Awake, alert cooperative, no apparent distress, and appears stated age.   HEENT:  Moist and pink mucous membranes, normocephalic, without obvious abnormality, atraumatic, normal ears and nose.   NECK:  Supple, symmetrical, trachea midline, no JVD, no adenopathy, thyroid symmetric, not enlarged and no tenderness, good carotid upstroke bilaterally, no carotid bruit, skin normal.   LUNGS: No increased work of breathing, good air exchange, clear to auscultation bilaterally, no crackles or wheezing.  Cardiovascular: Normal apical impulse, regular rate and rhythm, normal S1 and S2,  no S3 or S4, 2/6 systolic murmur at the apex, 2/6 systolic murmur at the left lower  sternal border, no pedal edema, good carotid upstroke bilaterally, no carotid bruit, no JVD, no abdominal pulsating masses.   ABDOMEN: Obese, Soft, nontender, no hepatomegaly, no splenomegaly, bowel sound positive.   CHEST:  Expands symmetrically, nontender to palpation.  Musculoskeletal:  No clubbing or cyanosis.  No redness, warmth, or swelling of the joints.  Neurological: Alert, awake, and oriented X3.   SKIN: No bruises, no bleeding, normal skin color, texture, turgor and no redness, warmth or swelling.    BP 116/60   Pulse 88   Resp 16   Ht 1.702 m (5\' 7" )   Wt 103.9 kg (229 lb)   BMI 35.87 kg/m     DATA:   I personally reviewed the visit EKG with the following interpretation: Sinus rhythm, bifascicular block, no significant changes when compared to previous    EKG 08/01/2022, sinus rhythm, right bundle branch block, normal axis    EKG 09/15/19  Sinus rhythm    ECHO: TEE ([place] at time of Watchman, 06/09/2022)  Findings discussed with Dr. Lysle Morales. L.V.: The visually estimated EF is 60-65%. Left ventricular diastolic function is grade I dysfunction. RWMA: There are no regional wall motion abnormalities. R.V.: Normal right ventricle size and function. L.A.: The left atrium is normal. There is no thrombus identified in the left atrial appendage. R.A.: The right atrium is normal. I.A.S.: The interatrial septum appears normal. The interatrial septum appears intact (no ASD or shunt). I.V.S.: Interventricular septum morphology: moderately hypertrophied. Pericardium.: The pericardium is normal. Pleural.: There is no pleural effusion. Ao.: The descending aorta thickness suggests intimal thickening. A.V.: The aortic valve is trileaflet, structurally normal, without evidence of stenosis or regurgitation. M.V.: The mitral valve is structurally normal, without evidence of stenosis with mild regurgitation. T.V.: The tricuspid valve is  structurally normal with no evidence of regurgitation or stenosis. P.V.: The pulmonic valve is structured normally with no evidence of stenosis with trace regurgitation.    07/24/2021  Summary  No previous echo for comparison. Technically adequate study.  Left ventricle size is normal.  Mild concentric left ventricular hypertrophy.  Ejection fraction is visually estimated at 55%.  No regional wall motion abnormalities seen.  There is doppler evidence of stage II diastolic dysfunction.  Physiologic and/or trace tricuspid regurgitation.  RVSP is 37 mmHg.  Pulmonary hypertension is mild.    09/05/19 Summary   Technically difficult examination. Enhancing contrast was used to define   endocardial borders.   Normal left ventricular chamber size.   Normal left ventricular systolic function.   Visually estimated LVEF is 60-65 %.   No wall motion abnormalities.   Normal right ventricle structure and function.   No significant valvular abnormalities.   No comparison study available.    Stress Test: Cardiac Testing:  EKG - (06/22/2017) Sinus bradycardia.  Normal ECG.  Echocardiogram - (06/26/2017) Normal left ventricular systolic function with stage I diastolic dysfunction.  Trace mitral valve regurgitation.  Trace tricuspid valve regurgitation with RVSP of 30 mmHg.  Stress Test - (07/05/2017) Negative Lexiscan stress test for ischemic symptoms or  ischemic EKG changes.  Normal Cardiolite perfusion scan without evidence of fixed or reversible defect.  Normal left ventricular systolic function with normal wall motion.  There is no old comparison study.  The results of the study predict low probability for significant coronary artery disease or future cardiac events.    Angiography:    Cardiology Labs: BMP:    Lab Results   Component Value Date/Time    NA 141  07/03/2023 08:35 AM    K 3.7 07/03/2023 08:35 AM    K 3.6 10/04/2021 09:17 PM    CL 100 07/03/2023 08:35 AM    CO2 29 07/03/2023 08:35 AM    BUN 25 07/03/2023 08:35 AM     CREATININE 1.0 07/03/2023 08:35 AM     CMP:    Lab Results   Component Value Date/Time    NA 141 07/03/2023 08:35 AM    K 3.7 07/03/2023 08:35 AM    K 3.6 10/04/2021 09:17 PM    CL 100 07/03/2023 08:35 AM    CO2 29 07/03/2023 08:35 AM    BUN 25 07/03/2023 08:35 AM    CREATININE 1.0 07/03/2023 08:35 AM     CBC:    Lab Results   Component Value Date/Time    WBC 9.7 07/03/2023 08:35 AM    RBC 4.59 07/03/2023 08:35 AM    HGB 13.4 07/03/2023 08:35 AM    HCT 39.8 07/03/2023 08:35 AM    MCV 86.7 07/03/2023 08:35 AM    RDW 15.0 07/03/2023 08:35 AM    PLT 251 07/03/2023 08:35 AM     PT/INR:  No results found for: "PTINR"  PT/INR Warfarin:  No components found for: "PTPATWAR", "PTINRWAR"  PTT:  No results found for: "APTT"  PTT Heparin:  No components found for: "APTTHEP"  Magnesium:    Lab Results   Component Value Date/Time    MG 1.9 05/26/2023 06:50 AM     TSH:    Lab Results   Component Value Date/Time    TSH 0.77 05/24/2023 05:07 AM     TROPONIN:  No components found for: "TROP"  BNP:  No results found for: "BNP"  FASTING LIPID PANEL:    Lab Results   Component Value Date/Time    CHOL 126 05/24/2023 05:07 AM    HDL 46 05/24/2023 05:07 AM    TRIG 51 05/24/2023 05:07 AM     No orders to display     I have personally reviewed the laboratory, cardiac diagnostic and radiographic testing as outlined above:      IMPRESSION:  1: Chronic diastolic congestive heart failure: Compensated, continue current treatment  2: Paroxysmal atrial fibrillation: S/p ablation, had Watchman procedure in June 2023, will continue current treatment  3: Hypertension: Controlled, continue current treatment.            4: Family history of early CAD: Father died of massive heart attack at age 68          RECOMMENDATIONS:   1.  Continue current treatment  2.  Toprol-XL 12.5 mg by mouth daily  3.  Follow-up with Dr. Shona Simpson as scheduled  4.  Follow-up with Dr. Arnette Norris in 1 year (he goes to [place] beginning of October till end of May, is established with  cardiology practice there), sooner if symptomatic for any reason    I have reviewed my findings and recommendations with patient    Electronically signed by [name], MD on 08/06/2023 at 11:58 AM    NOTE: This report was transcribed using voice recognition software. Every effort was made to ensure accuracy; however, inadvertent computerized transcription errors may be present

## 2023-08-14 MED ORDER — METOPROLOL SUCCINATE ER 25 MG PO TB24
25 | ORAL_TABLET | Freq: Every day | ORAL | 3 refills | Status: AC
Start: 2023-08-14 — End: ?

## 2023-08-16 ENCOUNTER — Encounter: Admit: 2023-08-16 | Discharge: 2023-08-16 | Payer: MEDICARE | Attending: Internal Medicine | Primary: Internal Medicine

## 2023-08-16 DIAGNOSIS — I5032 Chronic diastolic (congestive) heart failure: Secondary | ICD-10-CM

## 2023-08-16 MED ORDER — FUROSEMIDE 40 MG PO TABS
40 | ORAL_TABLET | Freq: Every day | ORAL | 0 refills | Status: AC
Start: 2023-08-16 — End: ?

## 2023-08-16 MED ORDER — LOSARTAN POTASSIUM 100 MG PO TABS
100 | ORAL_TABLET | Freq: Every day | ORAL | 0 refills | Status: AC
Start: 2023-08-16 — End: ?

## 2023-08-16 MED ORDER — OMEPRAZOLE 20 MG PO CPDR
20 | ORAL_CAPSULE | Freq: Every day | ORAL | 0 refills | Status: AC
Start: 2023-08-16 — End: ?

## 2023-08-16 MED ORDER — ATORVASTATIN CALCIUM 20 MG PO TABS
20 | ORAL_TABLET | Freq: Every evening | ORAL | 0 refills | Status: AC
Start: 2023-08-16 — End: ?

## 2023-08-16 NOTE — Progress Notes (Signed)
Chief Complaint   Patient presents with    Follow-up     Leg swelling getting better   Neck pain meds did not help       HPI:  Patient is here for follow-up.  Patient complains of retaining water in his legs gets short of breath with exertion he used to be on furosemide 40 mg tablet daily was switched to Bumex tablet twice daily stated that he does not experience the same urination effect as when he was taking furosemide.  Lab work showed serum creatinine 1.0 GFR 80 potassium 3.7    Patient is currently off prednisone for several weeks.  He complains of neck pain mainly on the left side Zanaflex did not help.      Past Medical History, Surgical History, and Family History has been reviewed and updated.    Review of Systems:  Constitutional:  No fever, no fatigue, no chills, no headaches, no weight change  Dermatology:  No rash, no mole, no dry or sensitive skin  ENT:  No cough, no sore throat, no sinus pain, no runny nose, no ear pain  Cardiology:  No chest pain, no palpitations, no leg edema, no shortness of breath, no PND  Gastroenterology:  No dysphagia, no abdominal pain, no nausea, no vomiting, no constipation, no diarrhea, no heartburn  Musculoskeletal:  No joint pain, no leg cramps, no back pain, no muscle aches  Respiratory:  No shortness of breath, no orthopnea, no wheezing, no DOE, no hemoptysis  Urology:  No blood in the urine, no urinary frequency, no urinary incontinence, no urinary urgency, no nocturia, no dysuria    Vitals:    08/16/23 1120   BP: 122/82   Pulse: 66   Temp: 98.1 F (36.7 C)   SpO2: 95%   Weight: 107.9 kg (237 lb 14.4 oz)   Height: 1.702 m (5' 7.01")       General:  Patient alert and oriented x 3, NAD, pleasant  HEENT:  Atraumatic, normocephalic, PERRLA, EOMI, clear conjunctiva, TMs clear, nose-clear, throat - no erythema  Neck:  Supple, no goiter, no carotid bruits, no LAD+ loss of cervical lordosis patient posture neck flexed+ left-sided paraspinal muscle tenderness, limited mobility  to the left  Lungs:  CTA   Heart:  RRR, no murmurs, gallops or rubs  Abdomen:  Soft/nt/nd, + bowel sounds  Lymph node examination: unremarkable  Neurological exam : unremarkable  Extremities:  No clubbing, cyanosis + 2+ bilateral leg edema  Skin: unremarkable    Hemoglobin A1C   Date Value Ref Range Status   09/05/2019 5.3 4.0 - 5.6 % Final     Cholesterol, Total   Date Value Ref Range Status   05/24/2023 126 <200 mg/dL Final     Triglycerides   Date Value Ref Range Status   05/24/2023 51 <150 mg/dL Final     HDL   Date Value Ref Range Status   05/24/2023 46 >40 mg/dL Final     VLDL   Date Value Ref Range Status   05/24/2023 10 mg/dL Final     Comment:     No normal range established.     Sodium   Date Value Ref Range Status   07/03/2023 141 132 - 146 mmol/L Final     Potassium   Date Value Ref Range Status   07/03/2023 3.7 3.5 - 5.0 mmol/L Final     Chloride   Date Value Ref Range Status   07/03/2023 100 98 - 107 mmol/L  Final     CO2   Date Value Ref Range Status   07/03/2023 29 22 - 29 mmol/L Final     BUN   Date Value Ref Range Status   07/03/2023 25 (H) 6 - 23 mg/dL Final     Creatinine   Date Value Ref Range Status   07/03/2023 1.0 0.70 - 1.20 mg/dL Final     Glucose   Date Value Ref Range Status   07/03/2023 96 74 - 99 mg/dL Final     Calcium   Date Value Ref Range Status   07/03/2023 8.8 8.6 - 10.2 mg/dL Final     Total Bilirubin   Date Value Ref Range Status   07/03/2023 0.3 0.0 - 1.2 mg/dL Final     Alkaline Phosphatase   Date Value Ref Range Status   07/03/2023 50 40 - 129 U/L Final     AST   Date Value Ref Range Status   07/03/2023 14 0 - 39 U/L Final     ALT   Date Value Ref Range Status   07/03/2023 16 0 - 40 U/L Final     Est, Glom Filt Rate   Date Value Ref Range Status   07/03/2023 80 >60 mL/min/1.30m2 Final     Comment:           These results are not intended for use in patients <32 years of age.        eGFR results are calculated without a race factor using the 2021 CKD-EPI equation.  Careful  clinical correlation is recommended, particularly when comparing to results   calculated using previous equations.  The CKD-EPI equation is less accurate in patients with extremes of muscle mass, extra-renal   metabolism of creatine, excessive creatine ingestion, or following therapy that affects   renal tubular secretion.       GFR African American   Date Value Ref Range Status   10/04/2021 >60  Final        No results found.     No colonoscopy on file   No cologuard on file  No FIT/FOBT on file   No flexible sigmoidoscopy on file   No breast cancer screening on file  No cervical cancer screening on file      Assessment/Plan:    Chronic diastolic congestive heart failure decompensated  Discontinue Bumex resume furosemide 40 mg tablet daily  Cervical spondylosis with cervical muscle spasm.  X-ray of C-spine physical therapy referral   PMR in remission  Hypertension controlled    Outpatient Encounter Medications as of 08/16/2023   Medication Sig Dispense Refill    atorvastatin (LIPITOR) 20 MG tablet Take 1 tablet by mouth nightly 90 tablet 0    losartan (COZAAR) 100 MG tablet Take 1 tablet by mouth daily 90 tablet 0    omeprazole (PRILOSEC) 20 MG delayed release capsule Take 1 capsule by mouth daily 90 capsule 0    furosemide (LASIX) 40 MG tablet Take 1 tablet by mouth daily 90 tablet 0    metoprolol succinate (TOPROL XL) 25 MG extended release tablet Take 1 tablet by mouth daily 90 tablet 3    Potassium Chloride 10 MEQ PACK Take 10 mEq by mouth every other day      celecoxib (CELEBREX) 200 MG capsule Take 1 capsule by mouth 2 times daily      tadalafil (CIALIS) 20 MG tablet Take 1 tablet by mouth as needed for Erectile Dysfunction      [DISCONTINUED] tiZANidine (  ZANAFLEX) 4 MG tablet Take 1 tablet by mouth at bedtime 30 tablet 0    [DISCONTINUED] bumetanide (BUMEX) 1 MG tablet Take 1 tablet by mouth in the morning and at bedtime 180 tablet 0    albuterol sulfate HFA (VENTOLIN HFA) 108 (90 Base) MCG/ACT inhaler Inhale  2 puffs into the lungs 4 times daily as needed for Wheezing 18 g 0    budesonide-formoterol (SYMBICORT) 160-4.5 MCG/ACT AERO Inhale 2 puffs into the lungs 2 times daily .  Rinse mouth and spit after each use. 10.2 g 1    potassium chloride (KLOR-CON M) 10 MEQ extended release tablet Take 1 tablet by mouth three times a week Sunday, Tuesday, Thursday      [DISCONTINUED] atorvastatin (LIPITOR) 20 MG tablet Take 1 tablet by mouth nightly 90 tablet 0    [DISCONTINUED] losartan (COZAAR) 100 MG tablet Take 1 tablet by mouth daily 90 tablet 0    [DISCONTINUED] omeprazole (PRILOSEC) 20 MG delayed release capsule Take 1 capsule by mouth daily 90 capsule 0     No facility-administered encounter medications on file as of 08/16/2023.        Kyan was seen today for follow-up.    Diagnoses and all orders for this visit:    Chronic diastolic congestive heart failure (HCC)    Hyperlipidemia, unspecified hyperlipidemia type  -     atorvastatin (LIPITOR) 20 MG tablet; Take 1 tablet by mouth nightly    Primary hypertension  -     losartan (COZAAR) 100 MG tablet; Take 1 tablet by mouth daily    Cervical spondylosis  -     XR CERVICAL SPINE (4-5 VIEWS); Future  -     [place] - Physical Therapy, [facility]  -     Ambulatory Referral to Care Management    Cervical paraspinal muscle spasm  -     [place] - Physical Therapy, [facility]  -     Ambulatory Referral to Care Management    Other orders  -     omeprazole (PRILOSEC) 20 MG delayed release capsule; Take 1 capsule by mouth daily  -     furosemide (LASIX) 40 MG tablet; Take 1 tablet by mouth daily         There are no Patient Instructions on file for this visit.     On this date 08/16/2023 I have spent 30 minutes reviewing previous notes, test results and face to face with the patient discussing the diagnosis and importance of compliance with the treatment plan as well as documenting on the day of the visit.      [name], MD   08/16/23

## 2023-08-20 MED ORDER — TADALAFIL 20 MG PO TABS
20 | ORAL_TABLET | ORAL | 0 refills | Status: DC | PRN
Start: 2023-08-20 — End: 2023-09-06

## 2023-08-23 ENCOUNTER — Ambulatory Visit: Admit: 2023-08-23 | Discharge: 2023-08-23 | Payer: MEDICARE | Attending: Adult Health | Primary: Internal Medicine

## 2023-08-23 VITALS — BP 150/79 | HR 63 | Temp 98.00000°F | Ht 66.14 in | Wt 236.8 lb

## 2023-08-23 DIAGNOSIS — I10 Essential (primary) hypertension: Secondary | ICD-10-CM

## 2023-08-23 MED ORDER — SEMAGLUTIDE-WEIGHT MANAGEMENT 0.25 MG/0.5ML SC SOAJ
0.25 | SUBCUTANEOUS | 0 refills | Status: DC
Start: 2023-08-23 — End: 2023-09-19

## 2023-08-23 MED ORDER — SEMAGLUTIDE-WEIGHT MANAGEMENT 0.5 MG/0.5ML SC SOAJ
0.5 | SUBCUTANEOUS | 0 refills | Status: DC
Start: 2023-08-23 — End: 2023-09-19

## 2023-08-23 NOTE — Patient Instructions (Signed)
 Patient Instructions:    Start Wegovy  by taking 0.25 mg every 7 days for four weeks, then increase to 0.5 mg every 7 days; at the end of four weeks contact me for the next prescription    I have reviewed the risks and the benefits of Wegovy . Patient continues to report no personal or family history of medullary thyroid carcinoma or multiple endocrine neoplasm type II. They were provided with tips for managing nausea, eating a bland , low fat food diet, eating food that contain water , avoid laying down after eating, going outside for fresh air. Patient was in agreement with this plan and did not have any further questions at this time.    Limit sodium to 2, 000 mg sodium     Eat on a 7plate, level the food off (do not mound it up) and do not go back for seconds.     Make at least one-half of the plate non-starchy vegetables.     Limit starchy vegetables and grains to 1/4 - 1/2 of the plate     Limit the entree to no more than 1/4 of the plate            Rules:  Use the plate method. Measure serving size  Limit sweets to one day per month  Limit chips/crackers/pretzels/nuts/popcorn to 100 -150 cal/day  Eliminate all sugar sweetened beverages (including fruit juice)  Limit restaurants (including fast food and food from a convenience store) to one time every two weeks while in town    Requirements:  Make sure protein intake is at least 79 grams per day (do not count protein every day; instead spot check your intake every 2-3 weeks and make sure what you think you are getting is close to accurate; consider using a protein shake if needed; these are in the pharmacy section of the stores, not the grocery section; Premier, Pure Protein and Fairlife are relatively inexpensive and taste good to most patients; other options are Nectar, Boost Max, Ensure Max, BeneProtein and GNC lean (which is lactose-free);   Nectar fruit, Premier Protein Clear, IsoPure Protein Drink, and Protein 2 O are water -based options; Quest (or Cosco,  which is cheaper and is ordered on Amazon) and the Oh Yeah 1 protein bars can also be used, but have less protein in them )              (Disclaimer: Dietary supplements rarely have their listed ingredients and the amount of each verified by a third party other. Sometimes they give verification for their claims to be GMO and gluten free and to be organic. However, even such verifications as these may still be untrustworthy.)    Make sure fiber intake is at least 22 grams/day. Do this in part or whole by taking 12 tablespoons of [employer] Fiber One original, plain cereal (or Kellogg's All Bran Buds cereal) or 4 tablespoons of wheat dextrin powder (Benefiber or generic brand). For both of these, start with 1/8th - 1/4th the target amount and every week add another 1/8th - 1/4th until reaching the target).  Also, fiber gummies containing inulin (such as Lysle Gallop, [place], [place]) or Fiber Choice Pre-biotic tablets containing inulin are options. 1 cup of beans or peas is an excellent food source of fiber. Low calorie, high fiber bread products containing modified wheat starch can be used. An example is the [employer] Xtreme Wellness High Fiber Carb Lean tortilla (7grams in 30 calories).  All of these fiber supplements  are for the health of the colon. Their purpose is not to prevent or treat constipation.    Drink at least 64 oz of water  each day  Take one multivitamin every day    Targets:  Limit calorie intake to 1400 calories/day  Continue to walk 30 minutes daily  Avoid eating 2 hours within bedtime.     Tips:  Do not eat outside of the dining room or the kitchen  Do not eat while watching TV, videos, working on the computer or using a smart phone  Do not eat food out of a multi-serving bag or container.  Establish 6 hours of food-free time-out periods (times you don't eat) each day. No period can be less than 1 hour long. The periods need to be the same every day for days that are the same (for  example, workdays would have one set of food free periods and weekends would have another set of days). These six hours are in addition to the two hours before bedtime and the time spent sleeping.    Food Resources :    Protein options (20-30 grams protein): 1 cup of low fat cottage cheese (180 cal/20 grams protein), 6 egg whites + 1 whole egg (170 cal, 24 grams of protein), 3 oz of chicken (130 cal, 25 grams protein), low fat, high protein Greek yogurt (240 cal, 30 grams protein),  4 oz.Tempeh (193 Cal/ 18 grams protein). 1 cup tofu ( 182 Cal/ 20 grams protein). 1/2 cup seitan ( 169 Ca/ 30 grams protein)    Principle sources of protein:    Lean meat    Low fat dairy    Egg whites    Beans and legumes     Principle sources of fiber:    Beans (not green beans), legumes and fruit    Examples of fruit are:    Blackberries 144g serving, 62 cal, 8g fiber    Blueberries 148g serving, 80 cal, 4g fiber    Strawberries 166g serving, 54 cal, 3g fiber    Bananas 118g serving, 105 cal, 3g fiber    Gala apples 172g serving, 98 cal, 4g fiber     Other sources of fiber:    Almonds  28g serving, 170 cal, 3g fiber    Walnuts 28g serving, 182 cal, 2g fiber    Pumpkin seeds 31g, 180 cal, 3g fiber    Sunflower seeds 30g, 180 cal, 2g fiber     Low calorie non-starchy vegetables:  Food (per 100 g) Calories Fibers T. Carbohydrates Protein Fat Sodium (mg) Potassium (mg)   Green Bell Peppers 10 1.7 4.6 0.9 0.2 3 175   Cucumbers 15 0.5 3.6 0.7 0.1 2 147   Celery 16 1.6 3 0.7 0.2 80 260   Tomatoes 18 1.2 3.9 0.9 0.2 5 237   Carrots 41 2.8 10 0.9 0.2 69 320   Cabbage 25 2.5 6 1.3 0.1 18 170   Broccoli 35 3.3 7.2 2.4 0.4 41 293   Cauliflower 23 2.3 4.1 1.8 0.5 15 142   Iceberg Lettuce 14 1.2 3 0.9 0.1 10 141   Kale 28 2 5.6 1.9 0.4 23 228   Brussel Sprouts 43 3.8 9 3.4 0.3 25 389   Spinach 23 2.4 3.8 3 0.3 70 466   Zucchini 15 1 2.7 1.1 0.4 3 264   Mushroom 28 2.2 5.3 2.2 0.5 2 356   Asparagus 22 2 4.1 2.4 0.2 14 224   Green Beans 35  3.2 7.9  1.9 0.3 1 146   Eggplant 35 2.5 8.7 0.8 0.2 1 123

## 2023-08-23 NOTE — Progress Notes (Signed)
 CC -   HTN, Obesity    BACKGROUND -   First visit: 08/23/2023    Obesity   Began about 10 years ago recently  gotten worse   Initial BMI 38.06, Wt 236.8 lbs Ht 5' 6.14  HS Grad wt does not remember    Lowest   wt does not remember   Highest  wt 236.8 lbs  Pattern of wt gain: gradual   Wt change past yr: gained 12 lbs  Most wt lost: 25 lbs Atkins  Other diets attempted: german baller     Desire to lose weight: 10/10  Problem posed by appetite: 7-8/10    Initial Diet:    Number of meals per day - 3    Number of snacks per day - 2    Meal volume - 10 plate,  sometimes seconds    Fast food/convenience store - 1-2 x/week    Restaurants (not fast food) - 3 x/week   Sweets - 7 d/week cake, ice cream, reeces   Chips - 1 d/week handful   Crackers/pretzels - 0 d/week   Nuts - 3 d/week cashews , peanuts handful    Peanut Butter - 3 d/week toast    Popcorn - 0 d/week   Dried fruit - 0 d/week   Whole fruit - 3-4 d/week   Breakfast cereal - 0 d/week   Granola/Protein/Energy bar - 0 d/week   Sugar sweetened beverages - coffee SF creamer, OJ  2 x week, lemonade 1 glass, 2-3 shot vodka,    Protein - No supplements   Fiber - No supplements   Multivitamin -None   Exercise:    Gym membership - no    Walking - yes 30 minutes  every day     Running - no    Resistance - no    Aerobic class - no  ____________________________    [facility] -  Past Medical History:   Diagnosis Date    Atrial fibrillation (HCC)     Has a Watchman device    GERD (gastroesophageal reflux disease)     Hyperlipidemia     Hypertension     Sleep apnea     uses the mouth piece     Current Outpatient Medications   Medication Sig Dispense Refill    tadalafil  (CIALIS ) 20 MG tablet Take 1 tablet by mouth as needed for Erectile Dysfunction 15 tablet 0    atorvastatin  (LIPITOR ) 20 MG tablet Take 1 tablet by mouth nightly 90 tablet 0    losartan  (COZAAR ) 100 MG tablet Take 1 tablet by mouth daily 90 tablet 0    omeprazole  (PRILOSEC) 20 MG delayed release capsule Take 1  capsule by mouth daily 90 capsule 0    furosemide  (LASIX ) 40 MG tablet Take 1 tablet by mouth daily 90 tablet 0    metoprolol  succinate (TOPROL  XL) 25 MG extended release tablet Take 1 tablet by mouth daily 90 tablet 3    Potassium Chloride  10 MEQ PACK Take 10 mEq by mouth every other day      celecoxib  (CELEBREX ) 200 MG capsule Take 1 capsule by mouth 2 times daily      potassium chloride  (KLOR-CON  M) 10 MEQ extended release tablet Take 1 tablet by mouth three times a week Sunday, Tuesday, Thursday       No current facility-administered medications for this visit.     Review of Systems   Constitutional:  Positive for fatigue. Negative for activity change, appetite change and unexpected  weight change.        Since this past year   Eyes:  Negative for visual disturbance.   Respiratory:  Positive for shortness of breath. Negative for cough.         With talking    Cardiovascular:  Positive for leg swelling. Negative for chest pain and palpitations.   Gastrointestinal:  Negative for constipation, diarrhea, nausea and vomiting.   Musculoskeletal:  Positive for back pain. Negative for gait problem and joint swelling.        Neck pain    Neurological:  Positive for numbness. Negative for dizziness, facial asymmetry, light-headedness and headaches.        Some numbness in his hands    Psychiatric/Behavioral:  Positive for sleep disturbance. The patient is not nervous/anxious.         Difficulty staying asleep       BP (!) 150/79 (Site: Left Upper Arm, Position: Sitting, Cuff Size: Large Adult)   Pulse 63   Temp 98 F (36.7 C)   Ht 1.68 m (5' 6.14)   Wt 107.4 kg (236 lb 12.8 oz)   BMI 38.06 kg/m     Physical Exam  Vitals reviewed.   Constitutional:       Appearance: He is obese.   Cardiovascular:      Rate and Rhythm: Normal rate and regular rhythm.      Heart sounds: Normal heart sounds. No murmur heard.  Pulmonary:      Effort: Pulmonary effort is normal. No respiratory distress.      Breath sounds: Rales present.       Comments: Posterior fine crackles  Musculoskeletal:      Right lower leg: Edema present.      Left lower leg: Edema present.      Comments: Reddened taut skin    Neurological:      Mental Status: He is alert and oriented to person, place, and time.   Psychiatric:         Mood and Affect: Mood normal.         Behavior: Behavior normal.       ______________________    HISTORY & ASSESSMENT/PLAN -     Problem 1- Hypertension   HPI -Diagnosed over 10 years ago  150/79 BP today  Home Monitoring: random spot checks wnl  Regimen losartan  100 mg daily  *controlled on current medication, patient asymptomatic.  Assessment  - Controlled. Weight reduction can help.excess wt is contributing to the severity of his HTN and the intensity of anti-hypertensive therapy needed to control it    Plan  - Continue medication.  He is following with his PCPWeight reduction per plan below      Problem 2 -  Obesity  HPI - See above Background for description  Weight   Date   236.8 lbs  08/23/2023    Patient's estimated daily energy need (DEN) = 1976 Cal/d     Weight effect of high risk foods =  Fast food 675 Cal/week + Sweets 710 cal + Nuts 495 cal + PB 570 Cal + SSB 1244 =3694 Cal/week =528 cal/day = 27 % DEN = 53 lbs/year    Talked about various options. Does not want VLCD. Would like portion controlled low calorie diet as detailed below. Would like an appetite suppressant, and after talking about options and side effects, opted for Wegovy  -  He is not a candidate for phentermine given his significant cardiac history.  If PA denied, we  may try bupropion       Assessment - Uncontrolled      Plan -     Patient Instructions:    Start Wegovy  by taking 0.25 mg every 7 days for four weeks, then increase to 0.5 mg every 7 days; at the end of four weeks contact me for the next prescription    I have reviewed the risks and the benefits of Wegovy . Patient continues to report no personal or family history of medullary thyroid carcinoma or multiple  endocrine neoplasm type II. They were provided with tips for managing nausea, eating a bland , low fat food diet, eating food that contain water , avoid laying down after eating, going outside for fresh air. Patient was in agreement with this plan and did not have any further questions at this time.    Limit sodium to 2, 000 mg sodium     Eat on a 7plate, level the food off (do not mound it up) and do not go back for seconds.     Make at least one-half of the plate non-starchy vegetables.     Limit starchy vegetables and grains to 1/4 - 1/2 of the plate     Limit the entree to no more than 1/4 of the plate            Rules:  Use the plate method. Measure serving size  Limit sweets to one day per month  Limit chips/crackers/pretzels/nuts/popcorn to 100 -150 cal/day  Eliminate all sugar sweetened beverages (including fruit juice)  Limit restaurants (including fast food and food from a convenience store) to one time every two weeks while in town    Requirements:  Make sure protein intake is at least 79 grams per day (do not count protein every day; instead spot check your intake every 2-3 weeks and make sure what you think you are getting is close to accurate; consider using a protein shake if needed; these are in the pharmacy section of the stores, not the grocery section; Premier, Pure Protein and Fairlife are relatively inexpensive and taste good to most patients; other options are Nectar, Boost Max, Ensure Max, BeneProtein and GNC lean (which is lactose-free);   Nectar fruit, Premier Protein Clear, IsoPure Protein Drink, and Protein 2 O are water -based options; Quest (or Cosco, which is cheaper and is ordered on Amazon) and the Oh Yeah 1 protein bars can also be used, but have less protein in them )              (Disclaimer: Dietary supplements rarely have their listed ingredients and the amount of each verified by a third party other. Sometimes they give verification for their claims to be GMO and gluten free and  to be organic. However, even such verifications as these may still be untrustworthy.)    Make sure fiber intake is at least 22 grams/day. Do this in part or whole by taking 12 tablespoons of [employer]'s Fiber One original, plain cereal (or Kellogg's All Bran Buds cereal) or 4 tablespoons of wheat dextrin powder (Benefiber or generic brand). For both of these, start with 1/8th - 1/4th the target amount and every week add another 1/8th - 1/4th until reaching the target).  Also, fiber gummies containing inulin (such as Lysle Gallop, Barahona, Swifton) or Fiber Choice Pre-biotic tablets containing inulin are options. 1 cup of beans or peas is an excellent food source of fiber. Low calorie, high fiber bread products containing modified wheat starch can be used. An  example is the [employer] Xtreme Wellness High Fiber Carb Lean tortilla (7grams in 30 calories).  All of these fiber supplements are for the health of the colon. Their purpose is not to prevent or treat constipation.    Drink at least 64 oz of water  each day  Take one multivitamin every day    Targets:  Limit calorie intake to 1400 calories/day  Continue to walk 30 minutes daily  Avoid eating 2 hours within bedtime.     Tips:  Do not eat outside of the dining room or the kitchen  Do not eat while watching TV, videos, working on the computer or using a smart phone  Do not eat food out of a multi-serving bag or container.  Establish 6 hours of food-free time-out periods (times you don't eat) each day. No period can be less than 1 hour long. The periods need to be the same every day for days that are the same (for example, workdays would have one set of food free periods and weekends would have another set of days). These six hours are in addition to the two hours before bedtime and the time spent sleeping.    Food Resources :    Protein options (20-30 grams protein): 1 cup of low fat cottage cheese (180 cal/20 grams protein), 6 egg whites + 1 whole  egg (170 cal, 24 grams of protein), 3 oz of chicken (130 cal, 25 grams protein), low fat, high protein Greek yogurt (240 cal, 30 grams protein),  4 oz.Tempeh (193 Cal/ 18 grams protein). 1 cup tofu ( 182 Cal/ 20 grams protein). 1/2 cup seitan ( 169 Ca/ 30 grams protein)    Principle sources of protein:    Lean meat    Low fat dairy    Egg whites    Beans and legumes     Principle sources of fiber:    Beans (not green beans), legumes and fruit    Examples of fruit are:    Blackberries 144g serving, 62 cal, 8g fiber    Blueberries 148g serving, 80 cal, 4g fiber    Strawberries 166g serving, 54 cal, 3g fiber    Bananas 118g serving, 105 cal, 3g fiber    Gala apples 172g serving, 98 cal, 4g fiber     Other sources of fiber:    Almonds  28g serving, 170 cal, 3g fiber    Walnuts 28g serving, 182 cal, 2g fiber    Pumpkin seeds 31g, 180 cal, 3g fiber    Sunflower seeds 30g, 180 cal, 2g fiber     Low calorie non-starchy vegetables:  Food (per 100 g) Calories Fibers T. Carbohydrates Protein Fat Sodium (mg) Potassium (mg)   Green Bell Peppers 10 1.7 4.6 0.9 0.2 3 175   Cucumbers 15 0.5 3.6 0.7 0.1 2 147   Celery 16 1.6 3 0.7 0.2 80 260   Tomatoes 18 1.2 3.9 0.9 0.2 5 237   Carrots 41 2.8 10 0.9 0.2 69 320   Cabbage 25 2.5 6 1.3 0.1 18 170   Broccoli 35 3.3 7.2 2.4 0.4 41 293   Cauliflower 23 2.3 4.1 1.8 0.5 15 142   Iceberg Lettuce 14 1.2 3 0.9 0.1 10 141   Kale 28 2 5.6 1.9 0.4 23 228   Brussel Sprouts 43 3.8 9 3.4 0.3 25 389   Spinach 23 2.4 3.8 3 0.3 70 466   Zucchini 15 1 2.7 1.1 0.4 3 264  Mushroom 28 2.2 5.3 2.2 0.5 2 356   Asparagus 22 2 4.1 2.4 0.2 14 224   Green Beans 35 3.2 7.9 1.9 0.3 1 146   Eggplant 35 2.5 8.7 0.8 0.2 1 123     No follow-ups on file.     Total time spent on encounter:80 minutes     Norvel Rossetti, APRN - CNS  Obesity  08/23/23

## 2023-08-24 NOTE — Care Coordination (Signed)
 Ambulatory Care Coordination Note     08/24/2023 11:16 AM     Patient outreach attempt by this ACM today to offer care management services. ACM was unable to reach the patient by telephone today; left voice message requesting a return phone call to this ACM.  letter mailed requesting patient to contact this ACM.      ACM: Monica Ka, RN     Care Summary Note: n/a    PCP/Specialist follow up:   Future Appointments         Provider Specialty Dept Phone    09/19/2023 5:30 PM Coalmer, Norvel, APRN - CNS Bariatrics [phone]            Follow Up:   Plan for next ACM outreach in approximately 1 week to complete:  - outreach attempt to offer care management services.

## 2023-08-31 NOTE — Telephone Encounter (Signed)
 Wegovy denied, will only cover for DM

## 2023-09-04 ENCOUNTER — Encounter
Admit: 2023-09-04 | Discharge: 2023-09-04 | Payer: MEDICARE | Attending: Rehabilitative and Restorative Service Providers" | Primary: Internal Medicine

## 2023-09-04 DIAGNOSIS — M47812 Spondylosis without myelopathy or radiculopathy, cervical region: Secondary | ICD-10-CM

## 2023-09-04 NOTE — Progress Notes (Signed)
 Physical Therapy Treatment Note    Date: 09/04/2023  Patient Name: Jeffrey [name]  DOB: [birth year]   MRN: [id number]  DOInjury: 05/2023  DOSx: -  Referring Provider: Bulah Mara RAMAN, MD  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 We will start w/ traction and AROM, stretching exercises.    X = TO BE PERFORMED NEXT VISIT  > = PROGRESS TO THIS    S: See eval  O: Discussed anatomy, physiology, body mechanics, principles of loading, and progressive loading/activity.  Reviewed home exercise program extensively; written copy provided.   Time 8969-8884     Visit 1 Repeat outcome measure at mid point and end.    Pain Pain with activity 7/10     ROM Extension, rotation bilaterall, and bilateral side bending limited 74%     Modalities      MH / Ice + ES   MO   Traction  x  MO         Manual      Cervical soft tissue mobilization    MT   ROM      AROM rotation x  NR      TE      TE   Stretching cervical rotation >  TE   Stretching cervical side bending >  TE   Chin tuck x     Exercise      Supine neck flexion    NR   Quadruped neck retraction   NR   Cervical lateral flexion isometrics    NR   Cervical extension isometrics    NR         ROWS: H   TE   ROWS: M  Reach and Pull   TE   ROWS: L   Reach and Pull   TE   Tubing Pushes   TE   Corebuilder    NR   Shoulder ER   TE   Shrugs   TE   Front raise / shoulder flexion   TE   Lateral raise / shoulder abduction   TE   Alternating dumbbell shoulder press > Alternating dumbbell circle   TE   Bent over row    TE               A:  Tolerated well.    P: Continue with rehab plan  Mikle Crumble, PT    Treatment Charges: Mins Units   Initial Evaluation 45 1   Re-Evaluation     Ther Exercise         TE     Manual Therapy     MT     Ther Activities        TA     Gait Training          GT     Neuro Re-education NR     Modalities      Non-Billable Service Time     Other     Total Time/Units 45 1

## 2023-09-04 NOTE — Progress Notes (Signed)
 [facility]  [place] [facility]  PHYSICAL THERAPY INITIAL EVALUATION         Date:  09/04/2023   Patient: Jeffrey [name]  DOB: [birth year]  MRN: [id number]  Referring Provider: Bulah Mara RAMAN, MD  [address]  [place],  [place] [address]     Medical Diagnosis:   [phone] (ICD-10-CM) - Cervical spondylosis  [phone] (ICD-10-CM) - Cervical paraspinal muscle spasm      Physician Order: Eval and Treat      SUBJECTIVE:     Onset date: 05/2023    Onset: Insidious      History / Mechanism of Injury: awoke one morning after moving from home to another home    Interventions for current problem:  muscle relaxor did not help, Ibuprofen helps    Chief complaint: pain, decreased motion, stiffness    Behavior: condition is staying the same    Pain:   Current: 0/10     Best: 7/10     Worst:7/10 (occurs with mornings).  Pain returns to baseline in an hour  Pain frequency: intermittent    Symptom Type / Quality: sharp  Location:: Neck: right lateral neck and left lateral neck without radiation.   90% on L/10% on R     Provoking Activities/Positions:  rotating head bilaterally                 Relieving Activitie/Positions:   not moving head and keeping in neutral position     Disturbed Sleep: no    Imaging results: No results found.    Past Medical History  Past Medical History:   Diagnosis Date    Atrial fibrillation (HCC)     Has a Watchman device    GERD (gastroesophageal reflux disease)     Hyperlipidemia     Hypertension     Sleep apnea     uses the mouth piece     Past Surgical History:   Procedure Laterality Date    APPENDECTOMY      CARDIAC SURGERY      watchman implant    COLONOSCOPY      HERNIA REPAIR         Medications:   Current Outpatient Medications   Medication Sig Dispense Refill    Semaglutide -Weight Management (WEGOVY ) 0.25 MG/0.5ML SOAJ SC injection Inject 0.25 mg into the skin every 7 days 2 mL 0    Semaglutide -Weight Management (WEGOVY ) 0.5 MG/0.5ML SOAJ SC injection Inject 0.5 mg into  the skin every 7 days 2 mL 0    tadalafil  (CIALIS ) 20 MG tablet Take 1 tablet by mouth as needed for Erectile Dysfunction 15 tablet 0    atorvastatin  (LIPITOR ) 20 MG tablet Take 1 tablet by mouth nightly 90 tablet 0    losartan  (COZAAR ) 100 MG tablet Take 1 tablet by mouth daily 90 tablet 0    omeprazole  (PRILOSEC) 20 MG delayed release capsule Take 1 capsule by mouth daily 90 capsule 0    furosemide  (LASIX ) 40 MG tablet Take 1 tablet by mouth daily 90 tablet 0    metoprolol  succinate (TOPROL  XL) 25 MG extended release tablet Take 1 tablet by mouth daily 90 tablet 3    celecoxib  (CELEBREX ) 200 MG capsule Take 1 capsule by mouth 2 times daily      potassium chloride  (KLOR-CON  M) 10 MEQ extended release tablet Take 1 tablet by mouth three times a week Sunday, Tuesday, Thursday       No current facility-administered medications  for this visit.       Occupation: retired. plumber.    Exercise regimen: walking 30 minutes    Hobbies: fishing    Patient Goals: pain relief, be able to turn head better    Contraindications/Precautions: none    OBJECTIVE:     Estimated body mass index is 38.06 kg/m as calculated from the following:    Height as of 08/23/23: 1.68 m (5' 6.14).    Weight as of 08/23/23: 107.4 kg (236 lb 12.8 oz).     Observations: well nourished male, Class 2 obesity (BMI of 35 to < 40), normal affect    Inspection: forward head         Range of Motion:    Neck:    Flexion:  [x]  Normal   []  Limited    Extension:  []  Normal   [x]  Limited     Right Rotation: []  Normal   [x]  Limited    Left Rotation:  []  Normal   [x]  Limited    Right Side Bending: []  Normal   [x]  Limited    Left Side Bending: []  Normal   [x]  Limited     ROM limited 75% in extension, R side bending , L side bending , R rotation , L rotation  due to pain, stiffness.  Compression more painful than tensile forces.     Upper Extremity:   Right:   [x]  Normal   []  Limited    Left:   [x]  Normal   []  Limited     Strength:     Neck: 5/5   R UE: 5/5   L UE:  5/5    Palpation: Tender to palpation left cervical paraspinal muscles and soft tissues.    Sensation: intact to light touch and temperature.    Special Tests:   [x]  Nerve Root Compression           Right [] + / [x]  -    Left [] + / [x]  -  [x]  Cervical Distraction [] + / [x]  -   []  Shoulder Abduction Test [] + / []  -    [x]  Spurling's Test           Right [] + / [x]  -    Left [] + / [x]  -     []  Other: [] + / []  -         Special Test Comments: Compression causes pain, not neurological symptoms.     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 We will start w/ traction and AROM, stretching exercises.    Problems:   Pain 7/10 intermittent  ROM decreased  Limitations with lifting, carrying      [x]  There are no barriers affecting plan of care or recovery    []  Barriers to this patient's plan of care or recovery include:    Domestic Concerns:  [x]  No  []  Yes:    Short Term goals (2-3 weeks)  Pain 4/10  ROM increase rot 45  Able to perform / complete the following functions / tasks:  better able to turn head to drive    Long Term goals (4-6 weeks)  Pain 1-2/10  ROM increase rot 55  Able to perform / complete the following functions / tasks:  better able to drive turning head  Independent with home exercise program (HEP)    Rehab Potential: [x]  Good  []  Fair  []  Poor    PLAN       Treatment Plan:  instruction in home exercise program   therapeutic exercise   therapeutic activity   neuromuscular re-education   manual therapy   mechanical traction     The following CPT codes are likely to be used in the care of this patient:   97163 PT Evaluation: High Complexity   97110 Therapeutic Exercise   97112 Neuromuscular Re-Education   97530 Therapeutic Activities   97140 Manual Therapy   97012 Mechanical Traction     Suggested Professional Referral: [x]  No  []  Yes:     Patient Education:  [x]  Plans / Goals, Risks / Benefits discussed  [x]  Home exercise program  Method of Education: [x]  Verbal  [x]  Demo  [x]   Written  Comprehension of Education:  [x]  Verbalizes understanding.  [x]  Demonstrates understanding.  []  Needs Review.  []  Demonstrates / verbalizes understanding of HEP / Ed previously given.    Frequency:  1-2 days per week for 4-6 weeks    Patient understands diagnosis/prognosis and consents to treatment, plan and goals: [x]  Yes    []  No     Thank you for the opportunity to work with your patient.  If you have questions or comments, please contact me at [phone]; fax: [phone].    Electronically signed by: Mikle Crumble, PT         Please sign Physician's Certification and return to:  [facility] PHYSICIANS [facility] CARE [facility] PHYSICAL THERAPY  [address]  [place] [address]  Dept: [phone]  Dept Fax: [phone]  Loc: [phone]    Physician's Certification / Comments     Frequency/Duration 1-2 days per week for 4-6 weeks.   Certification period from 09/04/2023  to 11/28/2023.    I have reviewed the Plan of Care established for skilled therapy services and certify that the services are required and that they will be provided while the patient is under my care.    Physician's Comments/Revisions:               Physician's Printed Name:                                           Physician's Signature:                                                               Date:

## 2023-09-05 ENCOUNTER — Encounter: Admit: 2023-09-05 | Discharge: 2023-09-05 | Payer: MEDICARE | Primary: Internal Medicine

## 2023-09-05 ENCOUNTER — Encounter: Payer: MEDICARE | Attending: Internal Medicine | Primary: Internal Medicine

## 2023-09-05 DIAGNOSIS — M47812 Spondylosis without myelopathy or radiculopathy, cervical region: Secondary | ICD-10-CM

## 2023-09-05 NOTE — Progress Notes (Signed)
 Physical Therapy Treatment Note    Date: 09/05/2023  Patient Name: Jeffrey [name]  DOB: 1946-02-15   MRN: [id number]  DOInjury: 05/2023  DOSx: -  Referring Provider: Bulah Mara RAMAN, MD  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 We will start w/ traction and AROM, stretching exercises.    Access Code [email or web address]     X = TO BE PERFORMED NEXT VISIT  > = PROGRESS TO THIS    S: pt reported no new changes start of session , and felt a little better end of session after ex's / traction .  O: Discussed anatomy, physiology, body mechanics, principles of loading, and progressive loading/activity.  Reviewed home exercise program extensively; written copy provided.   Time 0900-0930 am     Visit 2/ Repeat outcome measure at mid point and end.    Pain Pain with activity 7/10 start , 5/10 end of session      ROM Extension, rotation bilaterall, and bilateral side bending limited 74%     Modalities      MH / Ice + ES   MO   Traction  15# x 15 static  Post ex's  MO         Manual      Cervical soft tissue mobilization    MT   ROM  Pt given copy of ex     AROM rotation 2 x 10 HEP   NR      TE      TE   Stretching cervical rotation >  TE   Stretching cervical side bending >  TE   Chin tuck X next      Exercise      Supine neck flexion    NR   Quadruped neck retraction   NR   Cervical lateral flexion isometrics    NR   Cervical extension isometrics    NR         ROWS: H   TE   ROWS: M  Reach and Pull   TE   ROWS: L   Reach and Pull   TE   Tubing Pushes   TE   Corebuilder    NR   Shoulder ER   TE   Shrugs   TE   Front raise / shoulder flexion   TE   Lateral raise / shoulder abduction   TE   Alternating dumbbell shoulder press > Alternating dumbbell circle   TE   Bent over row    TE               A:  Tolerated well with newly given ex and use of traction as listed.      P:  Continue with rehab plan  [name], PTA    Treatment Charges: Mins Units   Initial Evaluation     Re-Evaluation     Ther Exercise         TE 10 1   Manual Therapy     MT     Ther Activities        TA     Gait Training          GT     Neuro Re-education NR     Modalities / traction  15 1  Non-Billable Service Time 5 0   Other     Total Time/Units 30 2

## 2023-09-06 ENCOUNTER — Ambulatory Visit: Admit: 2023-09-06 | Discharge: 2023-09-06 | Payer: MEDICARE | Attending: Internal Medicine | Primary: Internal Medicine

## 2023-09-06 ENCOUNTER — Encounter: Admit: 2023-09-06 | Payer: MEDICARE | Primary: Internal Medicine

## 2023-09-06 VITALS — BP 136/84 | HR 57 | Temp 98.10000°F | Ht 66.14 in | Wt 235.8 lb

## 2023-09-06 DIAGNOSIS — I5032 Chronic diastolic (congestive) heart failure: Secondary | ICD-10-CM

## 2023-09-06 DIAGNOSIS — M47812 Spondylosis without myelopathy or radiculopathy, cervical region: Secondary | ICD-10-CM

## 2023-09-06 MED ORDER — ATORVASTATIN CALCIUM 20 MG PO TABS
20 | ORAL_TABLET | Freq: Every evening | ORAL | 0 refills | 90.00000 days | Status: DC
Start: 2023-09-06 — End: 2024-07-04

## 2023-09-06 MED ORDER — SPIRONOLACTONE 25 MG PO TABS
25 | ORAL_TABLET | Freq: Every day | ORAL | 0 refills | 30.00000 days | Status: DC
Start: 2023-09-06 — End: 2024-07-04

## 2023-09-06 MED ORDER — FUROSEMIDE 40 MG PO TABS
40 | ORAL_TABLET | Freq: Every day | ORAL | 0 refills | Status: DC
Start: 2023-09-06 — End: 2023-11-05

## 2023-09-06 MED ORDER — LOSARTAN POTASSIUM 100 MG PO TABS
100 MG | ORAL_TABLET | Freq: Every day | ORAL | 0 refills | Status: DC
Start: 2023-09-06 — End: 2024-05-28

## 2023-09-06 MED ORDER — METOPROLOL SUCCINATE ER 25 MG PO TB24
25 | ORAL_TABLET | Freq: Every day | ORAL | 0 refills | Status: DC
Start: 2023-09-06 — End: 2024-03-20

## 2023-09-06 MED ORDER — TADALAFIL 20 MG PO TABS
20 | ORAL_TABLET | ORAL | 0 refills | Status: AC | PRN
Start: 2023-09-06 — End: ?

## 2023-09-06 MED ORDER — OMEPRAZOLE 20 MG PO CPDR
20 | ORAL_CAPSULE | Freq: Every day | ORAL | 0 refills | Status: DC
Start: 2023-09-06 — End: 2024-08-18

## 2023-09-06 NOTE — Progress Notes (Signed)
 Physical Therapy Treatment Note    Date: 09/06/2023  Patient Name: Jeffrey [name]  DOB: 1946-03-01   MRN: [id number]  DOInjury: 05/2023  DOSx: -  Referring Provider: Bulah Mara RAMAN, MD  [address]  [place],  [place] [address]     Medical Diagnosis:     [phone] (ICD-10-CM) - Cervical spondylosis  [phone] (ICD-10-CM) - Cervical paraspinal muscle spasm    [name] has cervical OA with limited ROM. We will start w/ traction and AROM, stretching exercises.    Access Code [email or web address]     X = TO BE PERFORMED NEXT VISIT  > = PROGRESS TO THIS    S: pt reports still feeling a little better  after ex's / traction .  O: Discussed anatomy, physiology, body mechanics, principles of loading, and progressive loading/activity.  Reviewed home exercise program extensively; written copy provided.   Time 9199-9169 am     Visit 3/ Repeat outcome measure at mid point and end.    Pain Pain 5/10       ROM Extension, rotation bilaterall, and bilateral side bending limited 74%     Modalities      MH / Ice + ES   MO   Traction  15# x 15 static    Pt needs manual push to be seated better in head harness / head sits a little above  Post ex's  MO         Manual      Cervical soft tissue mobilization    MT   ROM  Pt given copy of ex     AROM rotation 2 x 10 HEP   NR      TE      TE   Stretching cervical rotation >  TE   Stretching cervical side bending >  TE   Chin tuck 1 X 10 new  Pt given copy     Exercise      Supine neck flexion    NR   Quadruped neck retraction   NR   Cervical lateral flexion isometrics    NR   Cervical extension isometrics    NR         ROWS: H   TE   ROWS: M  Reach and Pull   TE   ROWS: L   Reach and Pull   TE   Tubing Pushes   TE   Corebuilder    NR   Shoulder ER   TE   Shrugs   TE   Front raise / shoulder flexion   TE   Lateral raise / shoulder abduction   TE   Alternating dumbbell shoulder press > Alternating dumbbell circle   TE   Bent over row    TE               A:  Tolerated well with newly given ex and use of traction as  listed.      P: Continue with rehab plan  [name], PTA    Treatment Charges: Mins Units   Initial Evaluation     Re-Evaluation     Ther Exercise         TE 10 1   Manual Therapy     MT     Ther Activities        TA     Gait Training          GT     Neuro Re-education NR  Modalities / traction  15 1   Non-Billable Service Time 5 0   Other     Total Time/Units 30 2

## 2023-09-06 NOTE — Progress Notes (Signed)
 Chief Complaint   Patient presents with    Leg Swelling     Legs are swelling x 1 mth, cannot get them to go down.  States you changed his water  pill to something else and it did not work       HPI:  Patient is here for follow-up.  Patient complains of bilateral leg swelling not any better furosemide  did not help, he complains of difficulty breathing when he lays down flat on his back, no cough no shortness of breath at rest  Compliant on medications    Past Medical History, Surgical History, and Family History has been reviewed and updated.    Review of Systems:  Constitutional:  No fever, no fatigue, no chills, no headaches, no weight change  Dermatology:  No rash, no mole, no dry or sensitive skin  ENT:  No cough, no sore throat, no sinus pain, no runny nose, no ear pain  Cardiology:  No chest pain, no palpitations, no leg edema, no shortness of breath, no PND  Gastroenterology:  No dysphagia, no abdominal pain, no nausea, no vomiting, no constipation, no diarrhea, no heartburn  Musculoskeletal:  No joint pain, no leg cramps, no back pain, no muscle aches  Respiratory:  No shortness of breath, no orthopnea, no wheezing, no DOE, no hemoptysis  Urology:  No blood in the urine, no urinary frequency, no urinary incontinence, no urinary urgency, no nocturia, no dysuria    Vitals:    09/06/23 1507   BP: 136/84   Site: Right Upper Arm   Position: Sitting   Cuff Size: Medium Adult   Pulse: 57   Temp: 98.1 F (36.7 C)   TempSrc: Temporal   SpO2: 94%   Weight: 107 kg (235 lb 12.8 oz)   Height: 1.68 m (5' 6.14)       General:  Patient alert and oriented x 3, NAD, pleasant  HEENT:  Atraumatic, normocephalic, PERRLA, EOMI, clear conjunctiva, TMs clear, nose-clear, throat - no erythema  Neck:  Supple, no goiter, no carotid bruits, no LAD  Lungs:  CTA   Heart:  RRR, + systolic murmur unchanged, gallops or rubs  Abdomen:  Soft/nt/nd, + bowel sounds  Lymph node examination: unremarkable  Neurological exam :  unremarkable  Extremities:  No clubbing, cyanosis + 2+ bilateral leg edema  Skin: unremarkable    Hemoglobin A1C   Date Value Ref Range Status   09/05/2019 5.3 4.0 - 5.6 % Final     Cholesterol, Total   Date Value Ref Range Status   05/24/2023 126 <200 mg/dL Final     Triglycerides   Date Value Ref Range Status   05/24/2023 51 <150 mg/dL Final     HDL   Date Value Ref Range Status   05/24/2023 46 >40 mg/dL Final     VLDL   Date Value Ref Range Status   05/24/2023 10 mg/dL Final     Comment:     No normal range established.     Sodium   Date Value Ref Range Status   07/03/2023 141 132 - 146 mmol/L Final     Potassium   Date Value Ref Range Status   07/03/2023 3.7 3.5 - 5.0 mmol/L Final     Chloride   Date Value Ref Range Status   07/03/2023 100 98 - 107 mmol/L Final     CO2   Date Value Ref Range Status   07/03/2023 29 22 - 29 mmol/L Final     BUN  Date Value Ref Range Status   07/03/2023 25 (H) 6 - 23 mg/dL Final     Creatinine   Date Value Ref Range Status   07/03/2023 1.0 0.70 - 1.20 mg/dL Final     Glucose   Date Value Ref Range Status   07/03/2023 96 74 - 99 mg/dL Final     Calcium    Date Value Ref Range Status   07/03/2023 8.8 8.6 - 10.2 mg/dL Final     Total Bilirubin   Date Value Ref Range Status   07/03/2023 0.3 0.0 - 1.2 mg/dL Final     Alkaline Phosphatase   Date Value Ref Range Status   07/03/2023 50 40 - 129 U/L Final     AST   Date Value Ref Range Status   07/03/2023 14 0 - 39 U/L Final     ALT   Date Value Ref Range Status   07/03/2023 16 0 - 40 U/L Final     Est, Glom Filt Rate   Date Value Ref Range Status   07/03/2023 80 >60 mL/min/1.54m2 Final     Comment:           These results are not intended for use in patients <7 years of age.        eGFR results are calculated without a race factor using the 2021 CKD-EPI equation.  Careful clinical correlation is recommended, particularly when comparing to results   calculated using previous equations.  The CKD-EPI equation is less accurate in patients  with extremes of muscle mass, extra-renal   metabolism of creatine, excessive creatine ingestion, or following therapy that affects   renal tubular secretion.       GFR African American   Date Value Ref Range Status   10/04/2021 >60  Final        No results found.     No colonoscopy on file   No cologuard on file  No FIT/FOBT on file   No flexible sigmoidoscopy on file   No breast cancer screening on file  No cervical cancer screening on file      Assessment/Plan:   Decompensated diastolic congestive heart failure.  Add spironolactone  25 mg tablet daily to current furosemide  40 mg tablet daily  PMR in remission  Hypertension controlled      Outpatient Encounter Medications as of 09/06/2023   Medication Sig Dispense Refill    atorvastatin  (LIPITOR ) 20 MG tablet Take 1 tablet by mouth nightly 90 tablet 0    furosemide  (LASIX ) 40 MG tablet Take 1 tablet by mouth daily 90 tablet 0    losartan  (COZAAR ) 100 MG tablet Take 1 tablet by mouth daily 90 tablet 0    metoprolol  succinate (TOPROL  XL) 25 MG extended release tablet Take 1 tablet by mouth daily 90 tablet 0    omeprazole  (PRILOSEC) 20 MG delayed release capsule Take 1 capsule by mouth daily 90 capsule 0    tadalafil  (CIALIS ) 20 MG tablet Take 1 tablet by mouth as needed for Erectile Dysfunction 15 tablet 0    spironolactone  (ALDACTONE ) 25 MG tablet Take 1 tablet by mouth daily 90 tablet 0    Semaglutide -Weight Management (WEGOVY ) 0.25 MG/0.5ML SOAJ SC injection Inject 0.25 mg into the skin every 7 days 2 mL 0    Semaglutide -Weight Management (WEGOVY ) 0.5 MG/0.5ML SOAJ SC injection Inject 0.5 mg into the skin every 7 days 2 mL 0    celecoxib  (CELEBREX ) 200 MG capsule Take 1 capsule by mouth 2 times daily  potassium chloride  (KLOR-CON  M) 10 MEQ extended release tablet Take 1 tablet by mouth three times a week Sunday, Tuesday, Thursday      [DISCONTINUED] tadalafil  (CIALIS ) 20 MG tablet Take 1 tablet by mouth as needed for Erectile Dysfunction 15 tablet 0     [DISCONTINUED] atorvastatin  (LIPITOR ) 20 MG tablet Take 1 tablet by mouth nightly 90 tablet 0    [DISCONTINUED] losartan  (COZAAR ) 100 MG tablet Take 1 tablet by mouth daily 90 tablet 0    [DISCONTINUED] omeprazole  (PRILOSEC) 20 MG delayed release capsule Take 1 capsule by mouth daily 90 capsule 0    [DISCONTINUED] furosemide  (LASIX ) 40 MG tablet Take 1 tablet by mouth daily 90 tablet 0    [DISCONTINUED] metoprolol  succinate (TOPROL  XL) 25 MG extended release tablet Take 1 tablet by mouth daily 90 tablet 3     No facility-administered encounter medications on file as of 09/06/2023.        Haik was seen today for leg swelling.    Diagnoses and all orders for this visit:    Chronic diastolic congestive heart failure (HCC)  -     Comprehensive Metabolic Panel; Future    Hyperlipidemia, unspecified hyperlipidemia type  -     atorvastatin  (LIPITOR ) 20 MG tablet; Take 1 tablet by mouth nightly  -     Comprehensive Metabolic Panel; Future    Primary hypertension  -     losartan  (COZAAR ) 100 MG tablet; Take 1 tablet by mouth daily  -     Comprehensive Metabolic Panel; Future    Paroxysmal atrial fibrillation (HCC)  -     Comprehensive Metabolic Panel; Future    Other orders  -     furosemide  (LASIX ) 40 MG tablet; Take 1 tablet by mouth daily  -     metoprolol  succinate (TOPROL  XL) 25 MG extended release tablet; Take 1 tablet by mouth daily  -     omeprazole  (PRILOSEC) 20 MG delayed release capsule; Take 1 capsule by mouth daily  -     tadalafil  (CIALIS ) 20 MG tablet; Take 1 tablet by mouth as needed for Erectile Dysfunction  -     spironolactone  (ALDACTONE ) 25 MG tablet; Take 1 tablet by mouth daily         There are no Patient Instructions on file for this visit.     On this date 09/06/2023 I have spent 30 minutes reviewing previous notes, test results and face to face with the patient discussing the diagnosis and importance of compliance with the treatment plan as well as documenting on the day of the visit.      Mara GORMAN Naval, MD   09/06/23

## 2023-09-11 ENCOUNTER — Encounter: Admit: 2023-09-11 | Discharge: 2023-09-11 | Payer: MEDICARE | Primary: Internal Medicine

## 2023-09-11 DIAGNOSIS — M47812 Spondylosis without myelopathy or radiculopathy, cervical region: Secondary | ICD-10-CM

## 2023-09-11 NOTE — Progress Notes (Signed)
 Physical Therapy Treatment Note    Date: 09/11/2023  Patient Name: Jeffrey [name]  DOB: 05-Dec-1946   MRN: [id number]  DOInjury: 05/2023  DOSx: -    Referring Provider:   Bulah Mara RAMAN, MD  [address]  [place],  [place] [address]       Medical Diagnosis:   [phone] (ICD-10-CM) - Cervical spondylosis  [phone] (ICD-10-CM) - Cervical paraspinal muscle spasm    [place]us has cervical OA with limited ROM. We will start w/ traction and AROM, stretching exercises.    Access Code [email or web address]     X = TO BE PERFORMED NEXT VISIT  > = PROGRESS TO THIS    S: Patient reports feeling 90% better and hasn't been having any pain. ROM has improved as well.   O:   Time 0920-1000     Visit 4/ Repeat outcome measure at mid point and end.    Pain Pain 5/10       ROM     Modalities      MH / Ice + ES   MO   Traction  15# x 15 static    Pt needs manual push to be seated better in head harness / head sits a little above  Post ex's  MO         Manual      Cervical soft tissue mobilization    MT   ROM  Pt given copy of ex     AROM rotation 2 x 10 HEP   NR      TE      TE   Stretching cervical rotation >  TE   Stretching cervical side bending >  TE   Chin tuck 1 X 10  Pt given copy     Exercise      Supine neck flexion    NR   Quadruped neck retraction   NR   Cervical lateral flexion isometrics    NR   Cervical extension isometrics    NR         ROWS: H   TE   ROWS: M  Reach and Pull   TE   ROWS: L   Reach and Pull   TE   Tubing Pushes   TE   Corebuilder    NR   Shoulder ER   TE   Shrugs   TE   Front raise / shoulder flexion   TE   Lateral raise / shoulder abduction   TE   Alternating dumbbell shoulder press > Alternating dumbbell circle   TE   Bent over row    TE               A: Tolerated well. Discussed anatomy, physiology, body mechanics, principles of loading, and progressive loading/activity. Reviewed home exercise program extensively; written copy provided.   P: Today was patient's last session per patient request due to feeling better and he is to  continue with HEP.   Aziya Arena, PTA    Treatment Charges: Mins Units   Initial Evaluation     Re-Evaluation     Ther Exercise         TE 10 1   Manual Therapy     MT     Ther Activities        TA     Gait Training          GT     Neuro Re-education NR     Modalities /  traction  15 1   Non-Billable Service Time 5 0   Other     Total Time/Units 30 2

## 2023-09-19 ENCOUNTER — Ambulatory Visit: Admit: 2023-09-19 | Discharge: 2023-09-19 | Payer: MEDICARE | Attending: Adult Health | Primary: Internal Medicine

## 2023-09-19 VITALS — BP 145/55 | HR 77 | Temp 97.50000°F | Ht 66.14 in | Wt 227.0 lb

## 2023-09-19 DIAGNOSIS — I10 Essential (primary) hypertension: Secondary | ICD-10-CM

## 2023-09-19 MED ORDER — BUPROPION HCL ER (XL) 150 MG PO TB24
150 MG | ORAL_TABLET | Freq: Every morning | ORAL | 2 refills | Status: DC
Start: 2023-09-19 — End: 2024-05-28

## 2023-09-19 NOTE — Patient Instructions (Signed)
 Patient Instructions:    Take Bupropion XL (Wellbutrin XL) 150 mg, one tablet every morning; if appetite suppression is insufficient after two weeks, then stop it    While taking it, check the BP twice each day for one week. If the systolic BP is >155 mmHg or the diastolic BP is >90 mmHg consistently, then stop taking it.    ----------------------------------------------------------------------------------------------------------------------------------    Limit sodium to 2, 000 mg sodium     Eat on a 7"plate, level the food off (do not mound it up) and do not go back for seconds.     Make at least one-half of the plate non-starchy vegetables.     Limit starchy vegetables and grains to 1/4 - 1/2 of the plate     Limit the entree to no more than 1/4 of the plate    -----------------------------------------------------------------------------------------  Rules:  Use the plate method. Measure serving size  Limit sweets to one day per month  Limit chips/crackers/pretzels/nuts/popcorn to 100 -150 cal/day  Eliminate all sugar sweetened beverages (including fruit juice)  Limit restaurants (including fast food and food from a convenience store) to one time every two weeks while in town    Requirements:  Make sure protein intake is at least 79 grams per day (do not count protein every day; instead spot check your intake every 2-3 weeks and make sure what you think you are getting is close to accurate; consider using a protein shake if needed; these are in the pharmacy section of the stores, not the grocery section; Premier, Pure Protein and Fairlife are relatively inexpensive and taste good to most patients; other options are Nectar, Boost Max, Ensure Max, BeneProtein and GNC lean (which is lactose-free);   Nectar fruit, Premier Protein Clear, IsoPure Protein Drink, and Protein 2 O are water-based options; Quest (or Cosco, which is cheaper and is ordered on Amazon) and the Oh Yeah 1 protein bars can also be used, but have  less protein in them )      (Disclaimer: Dietary supplements rarely have their listed ingredients and the amount of each verified by a third party other. Sometimes they give verification for their claims to be GMO and gluten free and to be organic. However, even such verifications as these may still be untrustworthy.)    Make sure fiber intake is at least 22 grams/day. Do this in part or whole by taking 12 tablespoons of [employer] Fiber One original, plain cereal (or Kellogg's All Bran Buds cereal) or 4 tablespoons of wheat dextrin powder (Benefiber or generic brand). For both of these, start with 1/8th - 1/4th the target amount and every week add another 1/8th - 1/4th until reaching the target).  Also, fiber gummies containing inulin (such as Nickola Major, Keener, [place]) or Fiber Choice Pre-biotic tablets containing inulin are options. 1 cup of beans or peas is an excellent food source of fiber. Low calorie, high fiber bread products containing modified wheat starch can be used. An example is the [employer] [place] [employer] Xtreme Wellness High Fiber Carb Lean tortilla (7grams in 30 calories).  All of these fiber supplements are for the health of the colon. Their purpose is not to prevent or treat constipation.    Drink at least 64 oz of water each day  Take one multivitamin every day    Targets:  Limit calorie intake to 1400 calories/day  Continue to walk 30 minutes daily  Avoid eating 2 hours within bedtime.     Tips:  Do  not eat outside of the dining room or the kitchen  Do not eat while watching TV, videos, working on the computer or using a smart phone  Do not eat food out of a multi-serving bag or container.  Establish 6 hours of food-free "time-out" periods (times you don't eat) each day. No period can be less than 1 hour long. The periods need to be the same every day for days that are the same (for example, workdays would have one set of food free periods and weekends would have another set of days). These  six hours are in addition to the two hours before bedtime and the time spent sleeping.    Food Resources :    Protein options (20-30 grams protein): 1 cup of low fat cottage cheese (180 cal/20 grams protein), 6 egg whites + 1 whole egg (170 cal, 24 grams of protein), 3 oz of chicken (130 cal, 25 grams protein), low fat, high protein [place] yogurt (240 cal, 30 grams protein),  4 oz.Tempeh (193 Cal/ 18 grams protein). 1 cup tofu ( 182 Cal/ 20 grams protein). 1/2 cup seitan ( 169 Ca/ 30 grams protein)    Principle sources of protein:    Lean meat    Low fat dairy    Egg whites    Beans and legumes     Principle sources of fiber:    Beans (not green beans), legumes and fruit    Examples of fruit are:    Blackberries 144g serving, 62 cal, 8g fiber    Blueberries 148g serving, 80 cal, 4g fiber    Strawberries 166g serving, 54 cal, 3g fiber    Bananas 118g serving, 105 cal, 3g fiber    Gala apples 172g serving, 98 cal, 4g fiber     Other sources of fiber:    Almonds  28g serving, 170 cal, 3g fiber    Walnuts 28g serving, 182 cal, 2g fiber    Pumpkin seeds 31g, 180 cal, 3g fiber    Sunflower seeds 30g, 180 cal, 2g fiber     Low calorie non-starchy vegetables:  Food (per 100 g) Calories Fibers T. Carbohydrates Protein Fat Sodium (mg) Potassium (mg)   Green Bell Peppers 10 1.7 4.6 0.9 0.2 3 175   Cucumbers 15 0.5 3.6 0.7 0.1 2 147   Celery 16 1.6 3 0.7 0.2 80 260   Tomatoes 18 1.2 3.9 0.9 0.2 5 237   Carrots 41 2.8 10 0.9 0.2 69 320   Cabbage 25 2.5 6 1.3 0.1 18 170   Broccoli 35 3.3 7.2 2.4 0.4 41 293   Cauliflower 23 2.3 4.1 1.8 0.5 15 142   Iceberg Lettuce 14 1.2 3 0.9 0.1 10 141   Kale 28 2 5.6 1.9 0.4 23 228   Brussel Sprouts 43 3.8 9 3.4 0.3 25 389   Spinach 23 2.4 3.8 3 0.3 70 466   Zucchini 15 1 2.7 1.1 0.4 3 264   Mushroom 28 2.2 5.3 2.2 0.5 2 356   Asparagus 22 2 4.1 2.4 0.2 14 224   Green Beans 35 3.2 7.9 1.9 0.3 1 146   Eggplant 35 2.5 8.7 0.8 0.2 1 123

## 2023-09-19 NOTE — Progress Notes (Signed)
 CC -   HTN, Obesity    BACKGROUND -   Last visit: 08/23/2023  First visit: 08/23/2023    Obesity   Began about 10 years ago recently  gotten worse   Initial BMI 38.06, Wt 236.8 lbs Ht 5' 6.14  HS Grad wt does not remember    Lowest   wt does not remember   Highest  wt 236.8 lbs  Pattern of wt gain: gradual   Wt change past yr: gained 12 lbs  Most wt lost: 25 lbs Atkins  Other diets attempted: german baller     Desire to lose weight: 10/10  Problem posed by appetite: 7-8/10    Initial Diet:    Number of meals per day - 3    Number of snacks per day - 2    Meal volume - 10 plate,  sometimes seconds    Fast food/convenience store - 1-2 x/week    Restaurants (not fast food) - 3 x/week   Sweets - 7 d/week cake, ice cream, reeces   Chips - 1 d/week handful   Crackers/pretzels - 0 d/week   Nuts - 3 d/week cashews , peanuts handful    Peanut Butter - 3 d/week toast    Popcorn - 0 d/week   Dried fruit - 0 d/week   Whole fruit - 3-4 d/week   Breakfast cereal - 0 d/week   Granola/Protein/Energy bar - 0 d/week   Sugar sweetened beverages - coffee SF creamer, OJ  2 x week, lemonade 1 glass, 2-3 shot vodka,    Protein - No supplements   Fiber - No supplements   Multivitamin -None   Exercise:    Gym membership - no    Walking - yes 30 minutes  every day     Running - no    Resistance - no    Aerobic class - no  ____________________________    [facility] [place] -  Past Medical History:   Diagnosis Date    Atrial fibrillation (HCC)     Has a Watchman device    GERD (gastroesophageal reflux disease)     Hyperlipidemia     Hypertension     Sleep apnea     uses the mouth piece     Current Outpatient Medications   Medication Sig Dispense Refill    buPROPion  (WELLBUTRIN  XL) 150 MG extended release tablet Take 1 tablet by mouth every morning 90 tablet 2    atorvastatin  (LIPITOR ) 20 MG tablet Take 1 tablet by mouth nightly 90 tablet 0    furosemide  (LASIX ) 40 MG tablet Take 1 tablet by mouth daily 90 tablet 0    losartan  (COZAAR ) 100 MG tablet Take  1 tablet by mouth daily 90 tablet 0    metoprolol  succinate (TOPROL  XL) 25 MG extended release tablet Take 1 tablet by mouth daily 90 tablet 0    omeprazole  (PRILOSEC) 20 MG delayed release capsule Take 1 capsule by mouth daily 90 capsule 0    tadalafil  (CIALIS ) 20 MG tablet Take 1 tablet by mouth as needed for Erectile Dysfunction 15 tablet 0    spironolactone  (ALDACTONE ) 25 MG tablet Take 1 tablet by mouth daily 90 tablet 0    celecoxib  (CELEBREX ) 200 MG capsule Take 1 capsule by mouth 2 times daily      potassium chloride  (KLOR-CON  M) 10 MEQ extended release tablet Take 1 tablet by mouth three times a week Sunday, Tuesday, Thursday       No current facility-administered medications for  this visit.      Review of Systems   Constitutional:  Negative for fatigue.   Respiratory:  Negative for shortness of breath.    Cardiovascular:  Negative for chest pain and palpitations.   Gastrointestinal:  Negative for constipation, nausea and vomiting.   Psychiatric/Behavioral:  Negative for sleep disturbance. The patient is not nervous/anxious.      BP (!) 145/55 (Site: Left Upper Arm, Position: Sitting, Cuff Size: Large Adult)   Pulse 77   Temp 97.5 F (36.4 C) (Temporal)   Ht 1.68 m (5' 6.14)   Wt 103 kg (227 lb)   BMI 36.48 kg/m     Physical Exam  Vitals reviewed.   Constitutional:       Appearance: He is obese.   Cardiovascular:      Rate and Rhythm: Normal rate and regular rhythm.      Heart sounds: Normal heart sounds. No murmur heard.  Pulmonary:      Effort: Pulmonary effort is normal. No respiratory distress.   Musculoskeletal:      Right lower leg: Edema present.      Left lower leg: Edema present.      Comments: Reddened taut skin    Neurological:      Mental Status: He is alert and oriented to person, place, and time.   Psychiatric:         Mood and Affect: Mood normal.         Behavior: Behavior normal.       ______________________    HISTORY & ASSESSMENT/PLAN -     Problem 1- Hypertension   HPI -Diagnosed  over 10 years ago  145/55 BP today  Home Monitoring: random spot checks wnl  Regimen losartan  100 mg daily  *controlled on current medication, patient asymptomatic.  Assessment  - Controlled. Weight reduction can help.excess wt is contributing to the severity of his HTN and the intensity of anti-hypertensive therapy needed to control it    Plan  - Continue medication.  He is following with his PCP Weight reduction per plan below    Problem 2 -  Obesity  HPI - See above Background for description  Weight   Date   236.8 lbs  08/23/2023   227 lbs   09/19/2023    Total weight change to date: 9.8 lbs    Patient's estimated daily energy need (DEN) = 1976 Cal/d   Average daily energy variance:            08/23/2023 - 09/19/2023: -9.8 lbs /27 d = -1270 Cal/d deficit. (4.1% change in total body weight)    Weight effect of high risk foods =  Fast food 675 Cal/week + Sweets 710 cal + Nuts 495 cal + PB 570 Cal + SSB 1244 =3694 Cal/week =528 cal/day = 27 % DEN = 53 lbs/year    Notes from previous visit    Talked about various options. Does not want VLCD. Would like portion controlled low calorie diet as detailed below. Would like an appetite suppressant, and after talking about options and side effects, opted for Wegovy  -  He is not a candidate for phentermine given his significant cardiac history.  If PA denied, we may try bupropion     Update:    Calorie monitoring: Portion control   Protein: pork chop,  cottage cheese, eggs, fish, protein shake, sausage   Fiber: fiber one , banana, green beans, wrap , peas, corn  Water : 48 ox/day  Micronutrients: none  Sweets:1  d/week cookies   Non-sweet snacks: 1 d/week handful   SSB: 1 shot vodka    Restaurant food: 0 x month   Appetite suppressant: none  Exercise: 30 minutes/day    Assessment - Improving. We will add bupropion  to help with appetite suppression . The current medical regimen is effective;  continue present plan.    Plan -     Patient Instructions:    Take Bupropion  XL  (Wellbutrin  XL) 150 mg, one tablet every morning; if appetite suppression is insufficient after two weeks, then stop it    While taking it, check the BP twice each day for one week. If the systolic BP is >155 mmHg or the diastolic BP is >90 mmHg consistently, then stop taking it.    ----------------------------------------------------------------------------------------------------------------------------------    Limit sodium to 2, 000 mg sodium     Eat on a 7plate, level the food off (do not mound it up) and do not go back for seconds.     Make at least one-half of the plate non-starchy vegetables.     Limit starchy vegetables and grains to 1/4 - 1/2 of the plate     Limit the entree to no more than 1/4 of the plate    -----------------------------------------------------------------------------------------  Rules:  Use the plate method. Measure serving size  Limit sweets to one day per month  Limit chips/crackers/pretzels/nuts/popcorn to 100 -150 cal/day  Eliminate all sugar sweetened beverages (including fruit juice)  Limit restaurants (including fast food and food from a convenience store) to one time every two weeks while in town    Requirements:  Make sure protein intake is at least 79 grams per day (do not count protein every day; instead spot check your intake every 2-3 weeks and make sure what you think you are getting is close to accurate; consider using a protein shake if needed; these are in the pharmacy section of the stores, not the grocery section; Premier, Pure Protein and Fairlife are relatively inexpensive and taste good to most patients; other options are Nectar, Boost Max, Ensure Max, BeneProtein and GNC lean (which is lactose-free);   Nectar fruit, Premier Protein Clear, IsoPure Protein Drink, and Protein 2 O are water -based options; Quest (or Cosco, which is cheaper and is ordered on Amazon) and the Oh Yeah 1 protein bars can also be used, but have less protein in them )      (Disclaimer:  Dietary supplements rarely have their listed ingredients and the amount of each verified by a third party other. Sometimes they give verification for their claims to be GMO and gluten free and to be organic. However, even such verifications as these may still be untrustworthy.)    Make sure fiber intake is at least 22 grams/day. Do this in part or whole by taking 12 tablespoons of [employer]'s Fiber One original, plain cereal (or Kellogg's All Bran Buds cereal) or 4 tablespoons of wheat dextrin powder (Benefiber or generic brand). For both of these, start with 1/8th - 1/4th the target amount and every week add another 1/8th - 1/4th until reaching the target).  Also, fiber gummies containing inulin (such as Lysle Gallop, Somerset, Wolf Trap) or Fiber Choice Pre-biotic tablets containing inulin are options. 1 cup of beans or peas is an excellent food source of fiber. Low calorie, high fiber bread products containing modified wheat starch can be used. An example is the [employer] Xtreme Wellness High Fiber Carb Lean tortilla (7grams in 30 calories).  All of these  fiber supplements are for the health of the colon. Their purpose is not to prevent or treat constipation.    Drink at least 64 oz of water  each day  Take one multivitamin every day    Targets:  Limit calorie intake to 1400 calories/day  Continue to walk 30 minutes daily  Avoid eating 2 hours within bedtime.     Tips:  Do not eat outside of the dining room or the kitchen  Do not eat while watching TV, videos, working on the computer or using a smart phone  Do not eat food out of a multi-serving bag or container.  Establish 6 hours of food-free time-out periods (times you don't eat) each day. No period can be less than 1 hour long. The periods need to be the same every day for days that are the same (for example, workdays would have one set of food free periods and weekends would have another set of days). These six hours are in addition to the two  hours before bedtime and the time spent sleeping.    Food Resources :    Protein options (20-30 grams protein): 1 cup of low fat cottage cheese (180 cal/20 grams protein), 6 egg whites + 1 whole egg (170 cal, 24 grams of protein), 3 oz of chicken (130 cal, 25 grams protein), low fat, high protein Greek yogurt (240 cal, 30 grams protein),  4 oz.Tempeh (193 Cal/ 18 grams protein). 1 cup tofu ( 182 Cal/ 20 grams protein). 1/2 cup seitan ( 169 Ca/ 30 grams protein)    Principle sources of protein:    Lean meat    Low fat dairy    Egg whites    Beans and legumes     Principle sources of fiber:    Beans (not green beans), legumes and fruit    Examples of fruit are:    Blackberries 144g serving, 62 cal, 8g fiber    Blueberries 148g serving, 80 cal, 4g fiber    Strawberries 166g serving, 54 cal, 3g fiber    Bananas 118g serving, 105 cal, 3g fiber    Gala apples 172g serving, 98 cal, 4g fiber     Other sources of fiber:    Almonds  28g serving, 170 cal, 3g fiber    Walnuts 28g serving, 182 cal, 2g fiber    Pumpkin seeds 31g, 180 cal, 3g fiber    Sunflower seeds 30g, 180 cal, 2g fiber     Low calorie non-starchy vegetables:  Food (per 100 g) Calories Fibers T. Carbohydrates Protein Fat Sodium (mg) Potassium (mg)   Green Bell Peppers 10 1.7 4.6 0.9 0.2 3 175   Cucumbers 15 0.5 3.6 0.7 0.1 2 147   Celery 16 1.6 3 0.7 0.2 80 260   Tomatoes 18 1.2 3.9 0.9 0.2 5 237   Carrots 41 2.8 10 0.9 0.2 69 320   Cabbage 25 2.5 6 1.3 0.1 18 170   Broccoli 35 3.3 7.2 2.4 0.4 41 293   Cauliflower 23 2.3 4.1 1.8 0.5 15 142   Iceberg Lettuce 14 1.2 3 0.9 0.1 10 141   Kale 28 2 5.6 1.9 0.4 23 228   Brussel Sprouts 43 3.8 9 3.4 0.3 25 389   Spinach 23 2.4 3.8 3 0.3 70 466   Zucchini 15 1 2.7 1.1 0.4 3 264   Mushroom 28 2.2 5.3 2.2 0.5 2 356   Asparagus 22 2 4.1 2.4 0.2 14 224   Green  Beans 35 3.2 7.9 1.9 0.3 1 146   Eggplant 35 2.5 8.7 0.8 0.2 1 123     Return in about 6 weeks (around 10/31/2023).     Total time spent on encounter:20 minutes      Jeffrey Rossetti, APRN - CNS  Obesity  09/19/2023

## 2023-09-26 ENCOUNTER — Encounter: Payer: MEDICARE | Attending: Internal Medicine | Primary: Internal Medicine

## 2023-09-27 NOTE — Care Coordination-Inpatient (Signed)
 ACM contacted Jeffrey [name].  Jeffrey [name] is in [place] for the winter and does not qualify for the program.

## 2023-10-02 MED ORDER — POTASSIUM CHLORIDE CRYS ER 10 MEQ PO TBCR
10 | ORAL_TABLET | ORAL | 0 refills | Status: DC
Start: 2023-10-02 — End: 2024-07-04

## 2023-10-23 IMAGING — CT CTA CORONARY WITH PLAQUE ANALYSIS
2 of 5 series · 13 of 20 positions shown, 15 images · IV contrast (APPLIED)
Comparison: None   
Count of known CT and Cardiac Nuclear Medicine studies performed in the previous 
12 months = 0.

________________________________________________________________________________________________ 
CTA CORONARY WITH PLAQUE ANALYSIS, 10/23/2023 [DATE]: 
CLINICAL INDICATION: Athscl heart disease of native coronary artery Olmaz/Digs Hiro 
pctrs 
A search for DICOM formatted images was conducted for prior CT imaging studies 
completed at a non-affiliated media free facility.
TECHNIQUE: The region of interest was scanned with contrast on a high resolution 
CT scanner using dose reduction techniques. 3-D renderings were reconstructed on 
an independent rotation. 70 mL of Isovue 370 MDV were administered per protocol. 
The patients eGFR was calculated to be 58.6 mL/min/1.73 m2 using the i-STAT 
device.. 
Images were performed and reconstructed using ECG gating. To reduce the risk of 
nephrotoxicity and to adequately monitor the patient, the patient was monitored 
for over 30 minutes post procedure with 250 mL of normal saline administered 
intravenously. 
CORONARY ARTERY EVALUATION: 
Dominance: The patient is right dominant. 
Left Main: 28% stenosis of the left main coronary artery 
Left Anterior Descending: 39% stenosis of the proximal left anterior descending 
coronary artery. 70% stenosis of the mid left anterior descending coronary 
artery. 11% stenosis of the distal left anterior descending coronary artery. 24% 
stenosis of D2. 
Left Circumflex: 58% stenosis of the proximal circumflex coronary artery 
Right Coronary: 17% stenosis of the proximal right coronary artery. 23% stenosis 
of the mid right coronary artery. 28% stenosis of the distal right coronary 
artery. 12% stenosis of the right posterior lateral branch. 
CARDIAC ANATOMY: No chamber enlargement or hypertrophy.  No evidence of 
hypoperfusion or infarct. Watchman device in place with proper seal. 
AORTA/GREAT VESSELS: No aneurysm or dissection. 
LUNGS/ PLEURA: Mild atelectasis in the bilateral lobes with bronchial wall 
thickening in the lower lobes. No effusion. 
MEDIASTINUM: No mass. 
OSSEOUS STRUCTURES: No acute fracture or destructive lesion.

[Series 8: soft 30-75% · axial · 0.35mm/px · z∈[-144,-63]mm · 6 of 1440 slices shown, 8 images]
[im 206/1440  vessel]
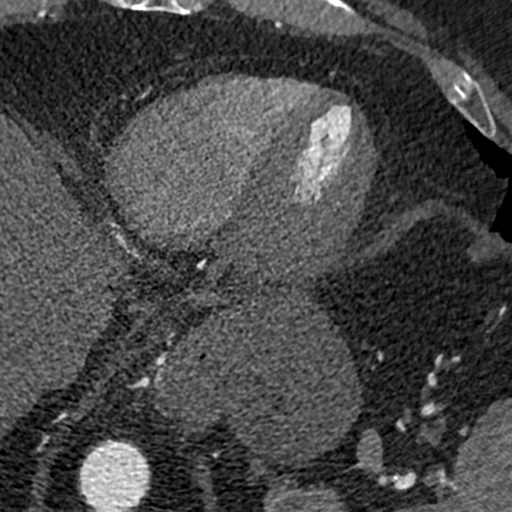
[im 206/1440  lung]
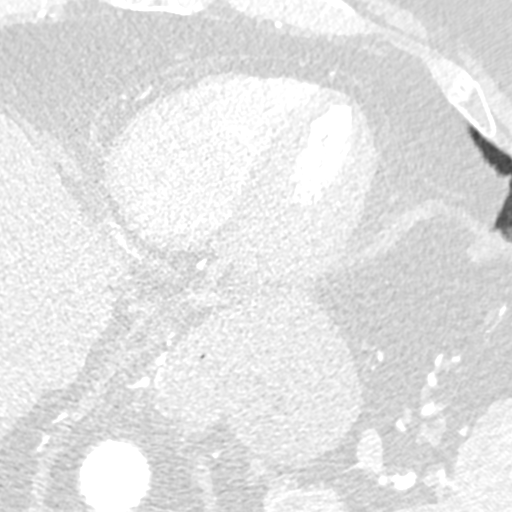
[im 412/1440  vessel]
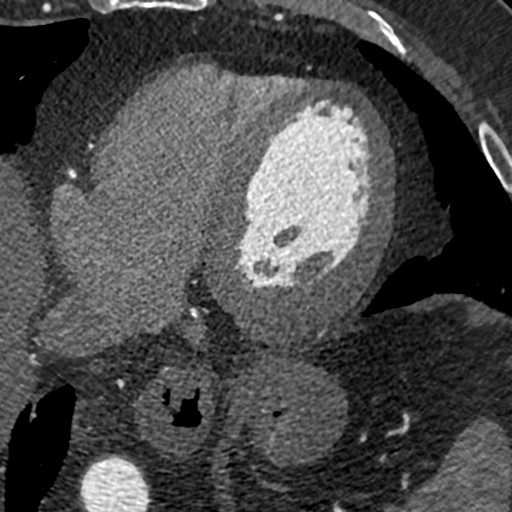
[im 617/1440  vessel]
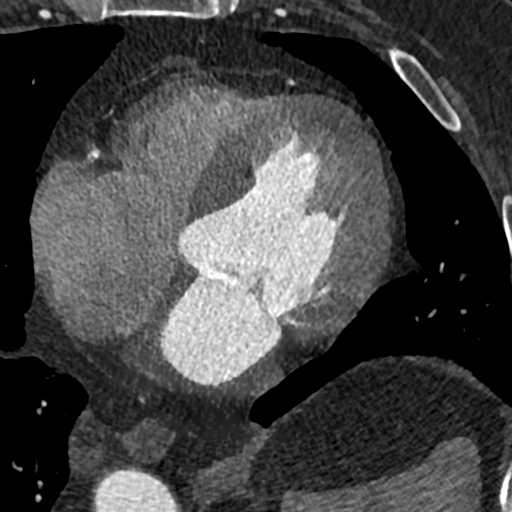
[im 823/1440  vessel]
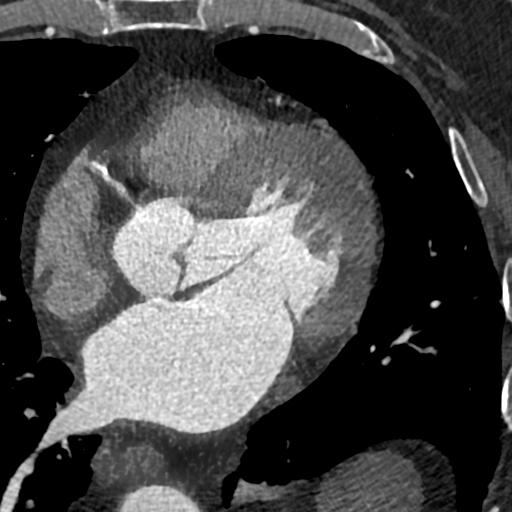
[im 1028/1440  vessel]
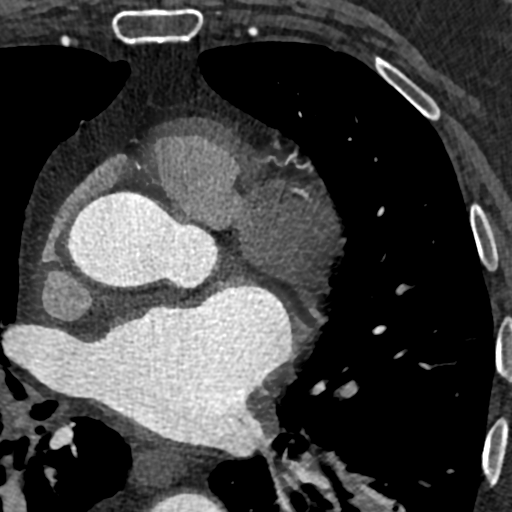
[im 1028/1440  lung]
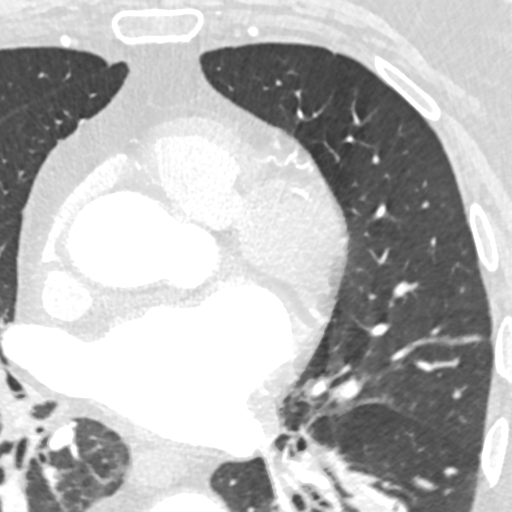
[im 1234/1440  vessel]
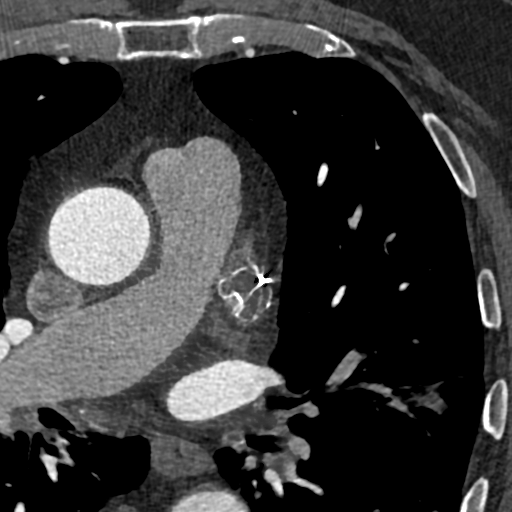

[Series 9: sharp filter 30-75% · axial · 0.35mm/px · z∈[-146,-62]mm · 7 of 1440 slices shown]
[im 180/1440  lung]
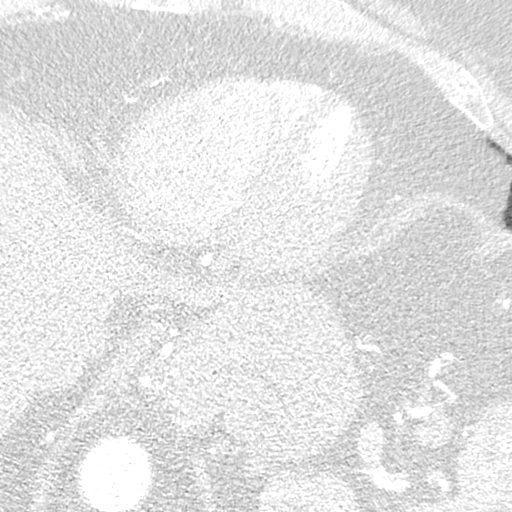
[im 360/1440  lung]
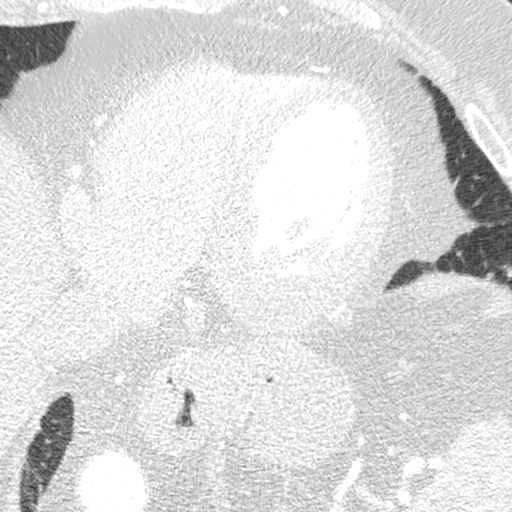
[im 540/1440  lung]
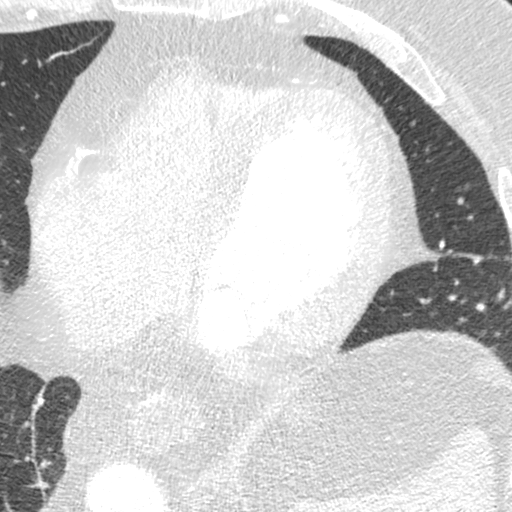
[im 720/1440  lung]
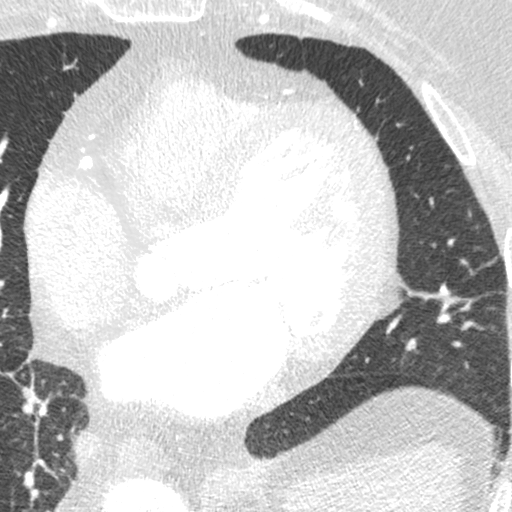
[im 900/1440  lung]
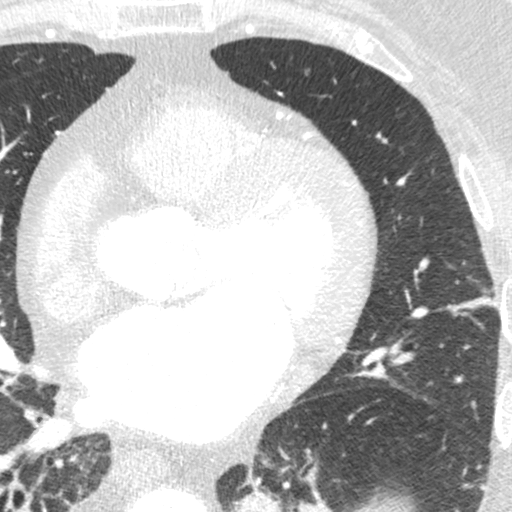
[im 1080/1440  lung]
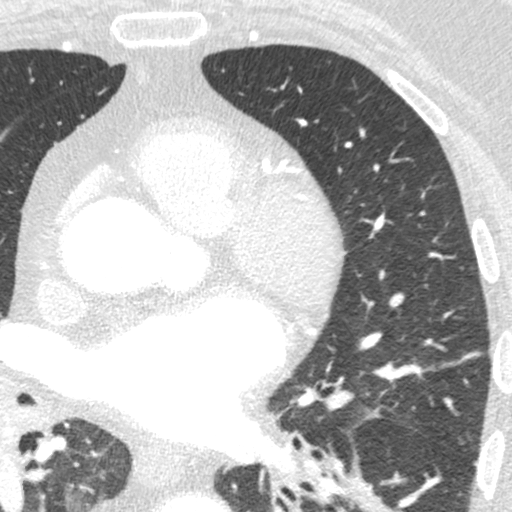
[im 1260/1440  lung]
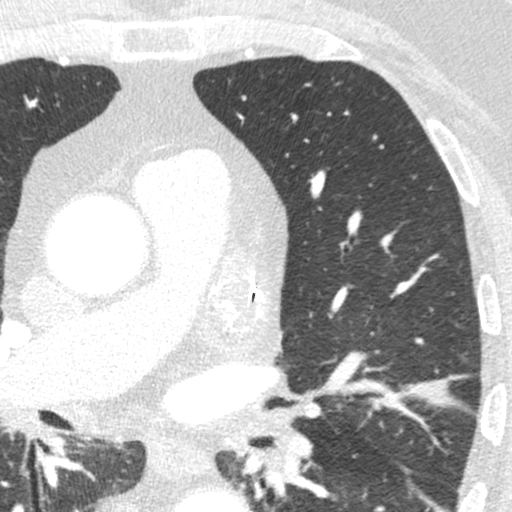

[13 of 20 positions shown; findings below may reference images not displayed]

IMPRESSION: 1. Normal myocardial morphology. 
2. 28% stenosis of the left main coronary artery 
39% stenosis of the proximal left anterior descending coronary artery. 70% 
stenosis of the mid left anterior descending coronary artery. 11% stenosis of 
the distal left anterior descending coronary artery. 24% stenosis of D2. 
58% stenosis of the proximal circumflex coronary artery 
17% stenosis of the proximal right coronary artery. 23% stenosis of the mid 
right coronary artery. 28% stenosis of the distal right coronary artery. 12% 
stenosis of the right posterior lateral branch. As there is a greater than 40% 
stenosis of the mid left anterior descending coronary artery, would recommend 
FFR CT to determine if this is physiologically flow-limiting. Would recommend 
ordering FFR CT and study can be performed off existing data without having to 
rescan the patient. 
3. Mild atelectasis in the bilateral lobes with bronchial wall thickening in the 
lower lobes. 
RADIATION DOSE REDUCTION: All CT scans are performed using radiation dose 
reduction techniques, when applicable.  Technical factors are evaluated and 
adjusted to ensure appropriate moderation of exposure.  Automated dose 
management technology is applied to adjust the radiation doses to minimize 
exposure while achieving diagnostic quality images.

## 2023-10-24 ENCOUNTER — Encounter: Payer: MEDICARE | Attending: Internal Medicine | Primary: Internal Medicine

## 2023-11-01 ENCOUNTER — Inpatient Hospital Stay: Admit: 2023-11-01 | Discharge: 2023-11-01 | Disposition: A | Discharge status: Home / Self Care | Payer: MEDICARE

## 2023-11-01 DIAGNOSIS — L03116 Cellulitis of left lower limb: Secondary | ICD-10-CM

## 2023-11-01 MED ORDER — DOXYCYCLINE HYCLATE 100 MG PO TABS
100 | ORAL_TABLET | Freq: Two times a day (BID) | ORAL | 0 refills | Status: AC
Start: 2023-11-01 — End: 2023-11-11

## 2023-11-01 MED ORDER — BACITRACIN-POLYMYXIN B 500-10000 UNIT/GM EX OINT
500-10000 | CUTANEOUS | 1 refills | Status: AC
Start: 2023-11-01 — End: 2023-11-08

## 2023-11-01 NOTE — ED Provider Notes (Signed)
Independent APP Visit.    HPI:  11/01/23,   Time: 11:04 AM EST         Jeffrey [name] is a 77 y.o. male presenting to the ED for leg infection.  He reports several weeks ago he had a blister on his left lower extremity from his leg swelling up secondary to his congestive heart failure.  However he is currently under control and the leg swelling has improved.  The blister popped on its own turned into a small abrasion.  Over the past several weeks he had noticed that the wound is not healing and in fact has gotten worse.  Draining pus.  Some surrounding erythema.  He is not a diabetic.  He was putting hydrocortisone cream on the affected area with no relief.  No fevers, chills, body aches, numbness or tingling in the legs or difficulty ambulating.    ROS:   Pertinent positives and negatives are stated within HPI, all other systems reviewed and are negative.  --------------------------------------------- PAST HISTORY ---------------------------------------------  Past Medical History:  has a past medical history of Atrial fibrillation (HCC), GERD (gastroesophageal reflux disease), Hyperlipidemia, Hypertension, and Sleep apnea.    Past Surgical History:  has a past surgical history that includes Appendectomy; hernia repair; Cardiac surgery; and Colonoscopy.    Social History:  reports that he has never smoked. He has never used smokeless tobacco. He reports current alcohol use of about 2.0 standard drinks of alcohol per week. He reports that he does not use drugs.    Family History: family history includes Heart Attack in his father.     The patient's home medications have been reviewed.    Allergies: Lidocaine, Lidocaine-epinephrine (pf), Cephalexin, and Sulfa antibiotics    -------------------------------------------------- RESULTS -------------------------------------------------  All laboratory and radiology results have been personally reviewed by myself   LABS:  No results found for this visit on  11/01/23.    RADIOLOGY:  Interpreted by Radiologist.  No orders to display       ------------------------- NURSING NOTES AND VITALS REVIEWED ---------------------------   The nursing notes within the ED encounter and vital signs as below have been reviewed.   BP 134/72   Pulse 56   Temp 98.7 F (37.1 C)   Resp 18   Wt 100.2 kg (221 lb)   SpO2 98%   BMI 35.52 kg/m   Oxygen Saturation Interpretation: Normal      ---------------------------------------------------PHYSICAL EXAM--------------------------------------    Constitutional/General: Alert and oriented x3, well appearing, non toxic in NAD. Ambulates well and without assistance   Head: NC/AT  Eyes: PERRL, EOMI  Mouth: Oropharynx clear, handling secretions, no trismus  Neck: Supple, full ROM, no meningeal signs  Pulmonary: Lungs clear to auscultation bilaterally, no wheezes, rales, or rhonchi. Not in respiratory distress  Cardiovascular:  Regular rate and rhythm, no murmurs, gallops, or rubs. 2+ distal pulses  Abdomen: Soft, non tender, non distended,   Extremities: Moves all extremities x 4. Warm and well perfused. No calf pain   Skin: warm and dry without rash. 2 cm wound over the anterior left lower extremity with pus drainage.  No gangrene or necrosis.  Several centimeters of surrounding erythema.  Compartments are soft and compressible.  Neurologic: GCS 15,  Psych: Normal Affect      ------------------------------ ED COURSE/MEDICAL DECISION MAKING----------------------  Medications - No data to display      Medical Decision Making:    Jeffrey [name] is a 77 y.o. male presenting to the ED for  leg infection.  He reports several weeks ago he had a blister on his left lower extremity from his leg swelling up secondary to his congestive heart failure.  However he is currently under control and the leg swelling has improved.  The blister popped on its own turned into a small abrasion.  Over the past several weeks he had noticed that the wound is not  healing and in fact has gotten worse.  Draining pus.  Some surrounding erythema.  He is not a diabetic.  He was putting hydrocortisone cream on the affected area with no relief. VS stable. PE: see above .    Ddx: Cellulitis versus necrotizing fasciitis vs sepsis vs DVT    Patient discharged home with doxy BID for 10 days. advised to complete his antibiotics even if symptoms are improving.  He was notified of the red flag symptoms and when to go to the emergency room.  Otherwise follow-up with your primary care provider.  He agreed with treatment plan had no further questions.  On reevaluation he remained hemodynamically stable, ambulatory and in no acute distress.        Counseling:   The emergency provider has spoken with the patient and discussed today's results, in addition to providing specific details for the plan of care and counseling regarding the diagnosis and prognosis.  Questions are answered at this time and they are agreeable with the plan.      --------------------------------- IMPRESSION AND DISPOSITION ---------------------------------    IMPRESSION  1. Cellulitis of left lower extremity        DISPOSITION  Disposition: Discharge to home  Patient condition is good                 Merilyn Baba, PA-C  11/01/23 1110

## 2023-11-02 ENCOUNTER — Ambulatory Visit: Admit: 2023-11-02 | Discharge: 2023-11-02 | Payer: MEDICARE | Attending: Adult Health | Primary: Internal Medicine

## 2023-11-02 VITALS — BP 132/63 | HR 59 | Temp 97.20000°F | Ht 66.0 in | Wt 221.0 lb

## 2023-11-02 DIAGNOSIS — I10 Essential (primary) hypertension: Secondary | ICD-10-CM

## 2023-11-02 NOTE — Progress Notes (Signed)
CC -   HTN, Obesity    BACKGROUND -   Last visit: 08/23/2023  First visit: 08/23/2023    Obesity   Began about 10 years ago recently  gotten worse   Initial BMI 38.06, Wt 236.8 lbs Ht 5' 6.14"  HS Grad wt does not remember    Lowest   wt does not remember   Highest  wt 236.8 lbs  Pattern of wt gain: gradual   Wt change past yr: gained 12 lbs  Most wt lost: 25 lbs Atkins  Other diets attempted: Glynn Octave     Desire to lose weight: 10/10  Problem posed by appetite: 7-8/10    Initial Diet:    Number of meals per day - 3    Number of snacks per day - 2    Meal volume - 10" plate,  sometimes seconds    Fast food/convenience store - 1-2 x/week    Restaurants (not fast food) - 3 x/week   Sweets - 7 d/week cake, ice cream, reeces   Chips - 1 d/week handful   Crackers/pretzels - 0 d/week   Nuts - 3 d/week cashews , peanuts handful    Peanut Butter - 3 d/week toast    Popcorn - 0 d/week   Dried fruit - 0 d/week   Whole fruit - 3-4 d/week   Breakfast cereal - 0 d/week   Granola/Protein/Energy bar - 0 d/week   Sugar sweetened beverages - coffee SF creamer, OJ  2 x week, lemonade 1 glass, 2-3 shot vodka,    Protein - No supplements   Fiber - No supplements   Multivitamin -None   Exercise:    Gym membership - no    Walking - yes 30 minutes  every day     Running - no    Resistance - no    Aerobic class - no  ____________________________    [facility] -  Past Medical History:   Diagnosis Date    Atrial fibrillation (HCC)     Has a Watchman device    GERD (gastroesophageal reflux disease)     Hyperlipidemia     Hypertension     Sleep apnea     "uses the mouth piece"     Current Outpatient Medications   Medication Sig Dispense Refill    doxycycline hyclate (VIBRA-TABS) 100 MG tablet Take 1 tablet by mouth 2 times daily for 10 days 20 tablet 0    bacitracin-polymyxin b (POLYSPORIN) 500-10000 UNIT/GM ointment Apply topically 2 times daily. 15 g 1    potassium chloride (KLOR-CON M) 10 MEQ extended release tablet Take 1 tablet by mouth  three times a week Sunday, Tuesday, Thursday 60 tablet 0    buPROPion (WELLBUTRIN XL) 150 MG extended release tablet Take 1 tablet by mouth every morning 90 tablet 2    atorvastatin (LIPITOR) 20 MG tablet Take 1 tablet by mouth nightly 90 tablet 0    furosemide (LASIX) 40 MG tablet Take 1 tablet by mouth daily 90 tablet 0    losartan (COZAAR) 100 MG tablet Take 1 tablet by mouth daily 90 tablet 0    metoprolol succinate (TOPROL XL) 25 MG extended release tablet Take 1 tablet by mouth daily 90 tablet 0    omeprazole (PRILOSEC) 20 MG delayed release capsule Take 1 capsule by mouth daily 90 capsule 0    tadalafil (CIALIS) 20 MG tablet Take 1 tablet by mouth as needed for Erectile Dysfunction 15 tablet 0  spironolactone (ALDACTONE) 25 MG tablet Take 1 tablet by mouth daily 90 tablet 0    celecoxib (CELEBREX) 200 MG capsule Take 1 capsule by mouth 2 times daily       No current facility-administered medications for this visit.      Review of Systems   Constitutional:  Negative for fatigue.   Respiratory:  Negative for shortness of breath.    Cardiovascular:  Negative for chest pain and palpitations.   Gastrointestinal:  Negative for constipation, nausea and vomiting.   Psychiatric/Behavioral:  Negative for sleep disturbance. The patient is not nervous/anxious.      BP 132/63 (Site: Left Upper Arm, Position: Sitting, Cuff Size: Large Adult)   Pulse 59   Temp 97.2 F (36.2 C) (Temporal)   Ht 1.676 m (5\' 6" )   Wt 100.2 kg (221 lb)   BMI 35.67 kg/m     Physical Exam  Vitals reviewed.   Constitutional:       Appearance: He is obese.   Cardiovascular:      Rate and Rhythm: Normal rate and regular rhythm.      Heart sounds: Normal heart sounds. No murmur heard.  Pulmonary:      Effort: Pulmonary effort is normal. No respiratory distress.   Musculoskeletal:      Right lower leg: Edema present.      Left lower leg: Edema present.      Comments: Reddened taut skin    Neurological:      Mental Status: He is alert and  oriented to person, place, and time.   Psychiatric:         Mood and Affect: Mood normal.         Behavior: Behavior normal.       ______________________    HISTORY & ASSESSMENT/PLAN -     Problem 1- Hypertension   HPI -Diagnosed over 10 years ago  145/55 BP today  Home Monitoring: random spot checks wnl  Regimen losartan 100 mg daily  *controlled on current medication, patient asymptomatic.  Assessment  - Controlled. Weight reduction can help.excess wt is contributing to the severity of his HTN and the intensity of anti-hypertensive therapy needed to control it    Plan  - Continue medication.  He is following with his PCP Weight reduction per plan below    Problem 2 -  Obesity  HPI - See above Background for description  Weight   Date   236.8 lbs  08/23/2023   227 lbs   09/19/2023   221 lbs   11/02/2023    Total weight change to date: -15.8 lbs.     Patient's estimated daily energy need (DEN) = 1976 Cal/d   Average daily energy variance:   08/23/2023 - 09/19/2023: -9.8 lbs /27 d = -1270 Cal/d deficit. (4.1% change in total body weight)  09/19/2023 - 11/02/2023: -6.0 lbs /44 d = -477 Cal/d deficit. (6.7% change in total body weight)    Weight effect of high risk foods =  Fast food 675 Cal/week + Sweets 710 cal + Nuts 495 cal + PB 570 Cal + SSB 1244 =3694 Cal/week =528 cal/day = 27 % DEN = 53 lbs/year    Notes from previous visit    Talked about various options. Does not want VLCD. Would like portion controlled low calorie diet as detailed below. Would like an appetite suppressant, and after talking about options and side effects, opted for [facility] -  He is not a candidate for phentermine given his  significant cardiac history.  If PA denied, we may try bupropion    We will add bupropion to help with appetite suppression . The current medical regimen is effective;  continue present plan.    Update:    Calorie monitoring: Portion control   Protein: pork chop,  cottage cheese, eggs, fish, protein shake, sausage   Fiber:  fiber one , banana, green beans, wrap , peas, corn  Water: 48 oz/day  Micronutrients: none  Sweets:1 d/week cookies   Non-sweet snacks: 1 d/week handful   SSB: 1 shot vodka    Restaurant food: 2 x week   Appetite suppressant: none  Exercise:  walking  6  x  week  for 30 minutes/day    Assessment - Improving. When he was in [place] , he had chest pain and numbness in righ hand.. He had an MRI of the heart . He is having cardiac cath next Thursday in [place]. He also had issue with his neck and was on a round of steroids.     Plan -     Patient Instructions:    Take Bupropion XL (Wellbutrin XL) 150 mg, one tablet every morning; if appetite suppression is insufficient after two weeks, then stop it    While taking it, check the BP twice each day for one week. If the systolic BP is >155 mmHg or the diastolic BP is >90 mmHg consistently, then stop taking it.    ----------------------------------------------------------------------------------------------------------------------------------    Limit sodium to 2, 000 mg sodium     Eat on a 7"plate, level the food off (do not mound it up) and do not go back for seconds.     Make at least one-half of the plate non-starchy vegetables.     Limit starchy vegetables and grains to 1/4 - 1/2 of the plate     Limit the entree to no more than 1/4 of the plate    -----------------------------------------------------------------------------------------  Rules:  Use the plate method. Measure serving size  Limit sweets to one day per month  Limit chips/crackers/pretzels/nuts/popcorn to 100 -150 cal/day  Eliminate all sugar sweetened beverages (including fruit juice)  Limit restaurants (including fast food and food from a convenience store) to one time every two weeks while in town    Requirements:  Make sure protein intake is at least 79 grams per day (do not count protein every day; instead spot check your intake every 2-3 weeks and make sure what you think you are getting is close to  accurate; consider using a protein shake if needed; these are in the pharmacy section of the stores, not the grocery section; Premier, Pure Protein and Fairlife are relatively inexpensive and taste good to most patients; other options are Nectar, Boost Max, Ensure Max, BeneProtein and GNC lean (which is lactose-free);   Nectar fruit, Premier Protein Clear, IsoPure Protein Drink, and Protein 2 O are water-based options; Quest (or Cosco, which is cheaper and is ordered on Amazon) and the Oh Yeah 1 protein bars can also be used, but have less protein in them )      (Disclaimer: Dietary supplements rarely have their listed ingredients and the amount of each verified by a third party other. Sometimes they give verification for their claims to be GMO and gluten free and to be organic. However, even such verifications as these may still be untrustworthy.)    Make sure fiber intake is at least 22 grams/day. Do this in part or whole by taking 12 tablespoons of  [employer] Fiber One original, plain cereal (or Kellogg's All Bran Buds cereal) or 4 tablespoons of wheat dextrin powder (Benefiber or generic brand). For both of these, start with 1/8th - 1/4th the target amount and every week add another 1/8th - 1/4th until reaching the target).  Also, fiber gummies containing inulin (such as Nickola Major, [place], [place]) or Fiber Choice Pre-biotic tablets containing inulin are options. 1 cup of beans or peas is an excellent food source of fiber. Low calorie, high fiber bread products containing modified wheat starch can be used. An example is the [employer] [place] [employer] Xtreme Wellness High Fiber Carb Lean tortilla (7grams in 30 calories).  All of these fiber supplements are for the health of the colon. Their purpose is not to prevent or treat constipation.    Drink at least 64 oz of water each day  Take one multivitamin every day    Targets:  Limit calorie intake to 1400 calories/day  Continue to walk 30 minutes daily  Avoid  eating 2 hours within bedtime.     Tips:  Do not eat outside of the dining room or the kitchen  Do not eat while watching TV, videos, working on the computer or using a smart phone  Do not eat food out of a multi-serving bag or container.  Establish 6 hours of food-free "time-out" periods (times you don't eat) each day. No period can be less than 1 hour long. The periods need to be the same every day for days that are the same (for example, workdays would have one set of food free periods and weekends would have another set of days). These six hours are in addition to the two hours before bedtime and the time spent sleeping.    Food Resources :    Protein options (20-30 grams protein): 1 cup of low fat cottage cheese (180 cal/20 grams protein), 6 egg whites + 1 whole egg (170 cal, 24 grams of protein), 3 oz of chicken (130 cal, 25 grams protein), low fat, high protein [place] yogurt (240 cal, 30 grams protein),  4 oz.Tempeh (193 Cal/ 18 grams protein). 1 cup tofu ( 182 Cal/ 20 grams protein). 1/2 cup seitan ( 169 Ca/ 30 grams protein)    Principle sources of protein:    Lean meat    Low fat dairy    Egg whites    Beans and legumes     Principle sources of fiber:    Beans (not green beans), legumes and fruit    Examples of fruit are:    Blackberries 144g serving, 62 cal, 8g fiber    Blueberries 148g serving, 80 cal, 4g fiber    Strawberries 166g serving, 54 cal, 3g fiber    Bananas 118g serving, 105 cal, 3g fiber    Gala apples 172g serving, 98 cal, 4g fiber     Other sources of fiber:    Almonds  28g serving, 170 cal, 3g fiber    Walnuts 28g serving, 182 cal, 2g fiber    Pumpkin seeds 31g, 180 cal, 3g fiber    Sunflower seeds 30g, 180 cal, 2g fiber     Low calorie non-starchy vegetables:  Food (per 100 g) Calories Fibers T. Carbohydrates Protein Fat Sodium (mg) Potassium (mg)   Ara Kussmaul Peppers 10 1.7 4.6 0.9 0.2 3 175   Cucumbers 15 0.5 3.6 0.7 0.1 2 147   Celery 16 1.6 3 0.7 0.2 80 260   Tomatoes 18 1.2 3.9 0.9  0.2 5 237   Carrots 41 2.8 10 0.9 0.2 69 320   Cabbage 25 2.5 6 1.3 0.1 18 170   Broccoli 35 3.3 7.2 2.4 0.4 41 293   Cauliflower 23 2.3 4.1 1.8 0.5 15 142   Iceberg Lettuce 14 1.2 3 0.9 0.1 10 141   Kale 28 2 5.6 1.9 0.4 23 228   Brussel Sprouts 43 3.8 9 3.4 0.3 25 389   Spinach 23 2.4 3.8 3 0.3 70 466   Zucchini 15 1 2.7 1.1 0.4 3 264   Mushroom 28 2.2 5.3 2.2 0.5 2 356   Asparagus 22 2 4.1 2.4 0.2 14 224   Green Beans 35 3.2 7.9 1.9 0.3 1 146   Eggplant 35 2.5 8.7 0.8 0.2 1 123     Return in about 7 months (around 06/01/2024).     Total time spent on encounter:20 minutes     Almira Bar, APRN - CNS  Obesity  11/02/2023

## 2023-11-02 NOTE — Patient Instructions (Signed)
Patient Instructions:    Take Bupropion XL (Wellbutrin XL) 150 mg, one tablet every morning; if appetite suppression is insufficient after two weeks, then stop it    While taking it, check the BP twice each day for one week. If the systolic BP is >155 mmHg or the diastolic BP is >90 mmHg consistently, then stop taking it.    ----------------------------------------------------------------------------------------------------------------------------------    Limit sodium to 2, 000 mg sodium     Eat on a 7"plate, level the food off (do not mound it up) and do not go back for seconds.     Make at least one-half of the plate non-starchy vegetables.     Limit starchy vegetables and grains to 1/4 - 1/2 of the plate     Limit the entree to no more than 1/4 of the plate    -----------------------------------------------------------------------------------------  Rules:  Use the plate method. Measure serving size  Limit sweets to one day per month  Limit chips/crackers/pretzels/nuts/popcorn to 100 -150 cal/day  Eliminate all sugar sweetened beverages (including fruit juice)  Limit restaurants (including fast food and food from a convenience store) to one time every two weeks while in town    Requirements:  Make sure protein intake is at least 79 grams per day (do not count protein every day; instead spot check your intake every 2-3 weeks and make sure what you think you are getting is close to accurate; consider using a protein shake if needed; these are in the pharmacy section of the stores, not the grocery section; Premier, Pure Protein and Fairlife are relatively inexpensive and taste good to most patients; other options are Nectar, Boost Max, Ensure Max, BeneProtein and GNC lean (which is lactose-free);   Nectar fruit, Premier Protein Clear, IsoPure Protein Drink, and Protein 2 O are water-based options; Quest (or Cosco, which is cheaper and is ordered on Amazon) and the Oh Yeah 1 protein bars can also be used, but have  less protein in them )      (Disclaimer: Dietary supplements rarely have their listed ingredients and the amount of each verified by a third party other. Sometimes they give verification for their claims to be GMO and gluten free and to be organic. However, even such verifications as these may still be untrustworthy.)    Make sure fiber intake is at least 22 grams/day. Do this in part or whole by taking 12 tablespoons of [employer] Fiber One original, plain cereal (or Kellogg's All Bran Buds cereal) or 4 tablespoons of wheat dextrin powder (Benefiber or generic brand). For both of these, start with 1/8th - 1/4th the target amount and every week add another 1/8th - 1/4th until reaching the target).  Also, fiber gummies containing inulin (such as Nickola Major, [place], [place]) or Fiber Choice Pre-biotic tablets containing inulin are options. 1 cup of beans or peas is an excellent food source of fiber. Low calorie, high fiber bread products containing modified wheat starch can be used. An example is the [place] [employer] Xtreme Wellness High Fiber Carb Lean tortilla (7grams in 30 calories).  All of these fiber supplements are for the health of the colon. Their purpose is not to prevent or treat constipation.    Drink at least 64 oz of water each day  Take one multivitamin every day    Targets:  Limit calorie intake to 1400 calories/day  Continue to walk 30 minutes daily  Avoid eating 2 hours within bedtime.     Tips:  Do  not eat outside of the dining room or the kitchen  Do not eat while watching TV, videos, working on the computer or using a smart phone  Do not eat food out of a multi-serving bag or container.  Establish 6 hours of food-free "time-out" periods (times you don't eat) each day. No period can be less than 1 hour long. The periods need to be the same every day for days that are the same (for example, workdays would have one set of food free periods and weekends would have another set of days). These  six hours are in addition to the two hours before bedtime and the time spent sleeping.    Food Resources :    Protein options (20-30 grams protein): 1 cup of low fat cottage cheese (180 cal/20 grams protein), 6 egg whites + 1 whole egg (170 cal, 24 grams of protein), 3 oz of chicken (130 cal, 25 grams protein), low fat, high protein [place] yogurt (240 cal, 30 grams protein),  4 oz.Tempeh (193 Cal/ 18 grams protein). 1 cup tofu ( 182 Cal/ 20 grams protein). 1/2 cup seitan ( 169 Ca/ 30 grams protein)    Principle sources of protein:    Lean meat    Low fat dairy    Egg whites    Beans and legumes     Principle sources of fiber:    Beans (not green beans), legumes and fruit    Examples of fruit are:    Blackberries 144g serving, 62 cal, 8g fiber    Blueberries 148g serving, 80 cal, 4g fiber    Strawberries 166g serving, 54 cal, 3g fiber    Bananas 118g serving, 105 cal, 3g fiber    Gala apples 172g serving, 98 cal, 4g fiber     Other sources of fiber:    Almonds  28g serving, 170 cal, 3g fiber    Walnuts 28g serving, 182 cal, 2g fiber    Pumpkin seeds 31g, 180 cal, 3g fiber    Sunflower seeds 30g, 180 cal, 2g fiber     Low calorie non-starchy vegetables:  Food (per 100 g) Calories Fibers T. Carbohydrates Protein Fat Sodium (mg) Potassium (mg)   Green Bell Peppers 10 1.7 4.6 0.9 0.2 3 175   Cucumbers 15 0.5 3.6 0.7 0.1 2 147   Celery 16 1.6 3 0.7 0.2 80 260   Tomatoes 18 1.2 3.9 0.9 0.2 5 237   Carrots 41 2.8 10 0.9 0.2 69 320   Cabbage 25 2.5 6 1.3 0.1 18 170   Broccoli 35 3.3 7.2 2.4 0.4 41 293   Cauliflower 23 2.3 4.1 1.8 0.5 15 142   Iceberg Lettuce 14 1.2 3 0.9 0.1 10 141   Kale 28 2 5.6 1.9 0.4 23 228   Brussel Sprouts 43 3.8 9 3.4 0.3 25 389   Spinach 23 2.4 3.8 3 0.3 70 466   Zucchini 15 1 2.7 1.1 0.4 3 264   Mushroom 28 2.2 5.3 2.2 0.5 2 356   Asparagus 22 2 4.1 2.4 0.2 14 224   Green Beans 35 3.2 7.9 1.9 0.3 1 146   Eggplant 35 2.5 8.7 0.8 0.2 1 123

## 2023-11-05 MED ORDER — FUROSEMIDE 40 MG PO TABS
40 MG | ORAL_TABLET | Freq: Every day | ORAL | 0 refills | Status: DC
Start: 2023-11-05 — End: 2024-06-04

## 2023-11-05 NOTE — Telephone Encounter (Signed)
Name of Medication(s) Requested:  Requested Prescriptions     Pending Prescriptions Disp Refills    furosemide (LASIX) 40 MG tablet [Pharmacy Med Name: FUROSEMIDE 40MG  TABLETS] 90 tablet 0     Sig: TAKE 1 TABLET BY MOUTH DAILY       Medication is on current medication list Yes    Dosage and directions were verified? Yes    Quantity verified: 90 day supply     Pharmacy Verified?  Yes    Last Appointment:  09/06/2023    Future appts:  Future Appointments   Date Time Provider Department Center   05/28/2024 11:30 AM Coalmer, Karin Golden, APRN - CNS Surg [facility]        (If no appt send self scheduling link. Marland KitchenREFILLAPPT)  Scheduling request sent?     []  Yes  []  No    Does patient need updated?  []  Yes  []  No

## 2024-02-20 IMAGING — MR MRI CERVICAL SPINE WITHOUT CONTRAST
11 of 20 series · 12 of 48 positions shown · IV contrast (gadolinium)
Comparison: None

________________________________________________________________________________________________ 
MRI CERVICAL SPINE WITHOUT CONTRAST, 02/20/2024 [DATE]: 
CLINICAL INDICATION: Radiculopathy, Cervical Region , neck pain and limited 
range of motion. Tingling.
TECHNIQUE: Multiplanar, multiecho position MR images of the cervical spine were 
performed without intravenous gadolinium enhancement. Patient was scanned on a 
1.5T magnet.

[Series 101: survey · axial · 10.0mm · 1.25mm/px · 1 of 10 slices shown (1 of 2)]
[im 1/10]
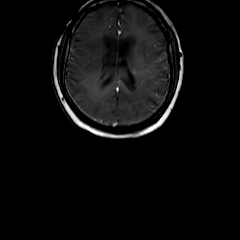

[Series 201: survey · axial · 10.0mm · 1.25mm/px · 1 of 10 slices shown (2 of 2)]
[im 1/10]
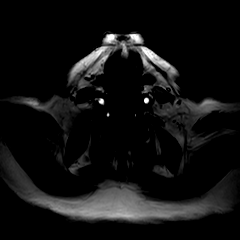

[Series 301: t2w_cor-surv · coronal · 5.0mm · 0.69mm/px · 1 of 8 slices shown]
[im 1/8]
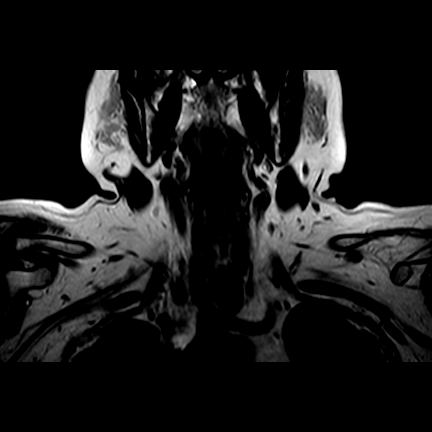

[Series 401: T1 · sagittal · 3.0mm · 0.39mm/px · 1 of 15 slices shown]
[im 1/15]
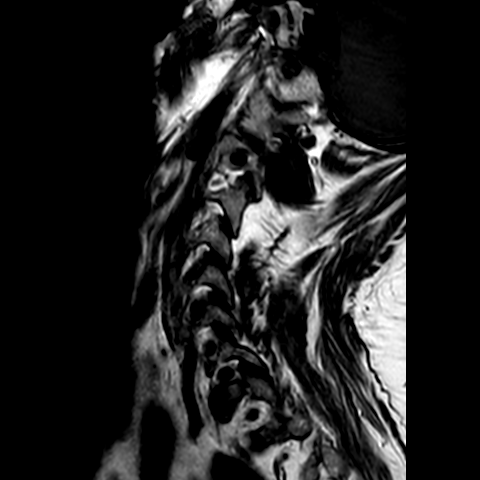

[Series 502: (id)_mdixon_tse · sagittal · 3.0mm · 0.35mm/px · 1 of 15 slices shown (1 of 2)]
[im 1/15]
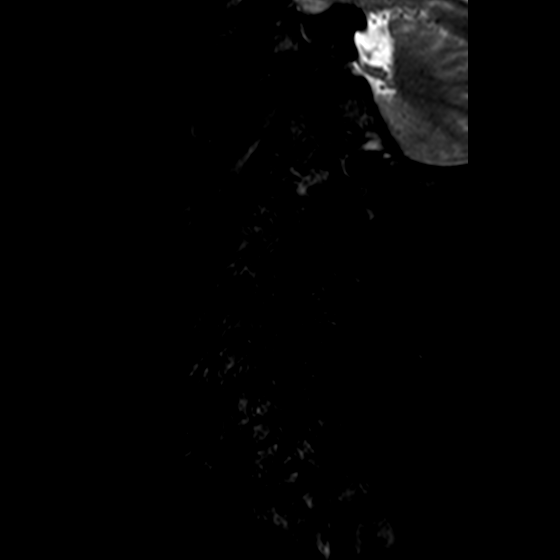

[Series 503: st2w_mdixon_tse · sagittal · 3.0mm · 0.35mm/px · 1 of 15 slices shown]
[im 1/15]
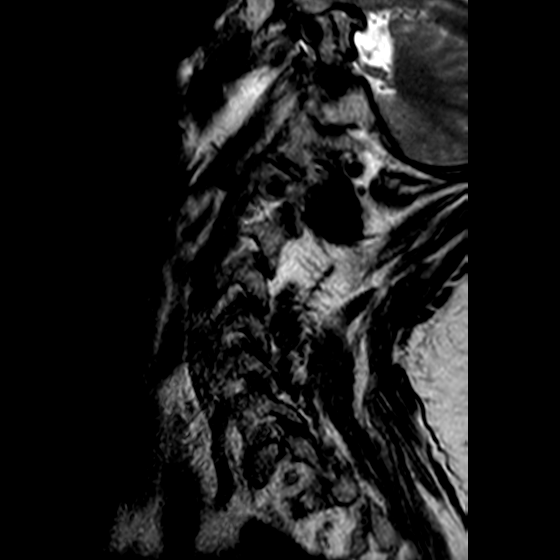

[Series 604: csp left oblq · oblique · 1.0mm · 0.15mm/px · 1 of 36 slices shown]
[im 1/36]
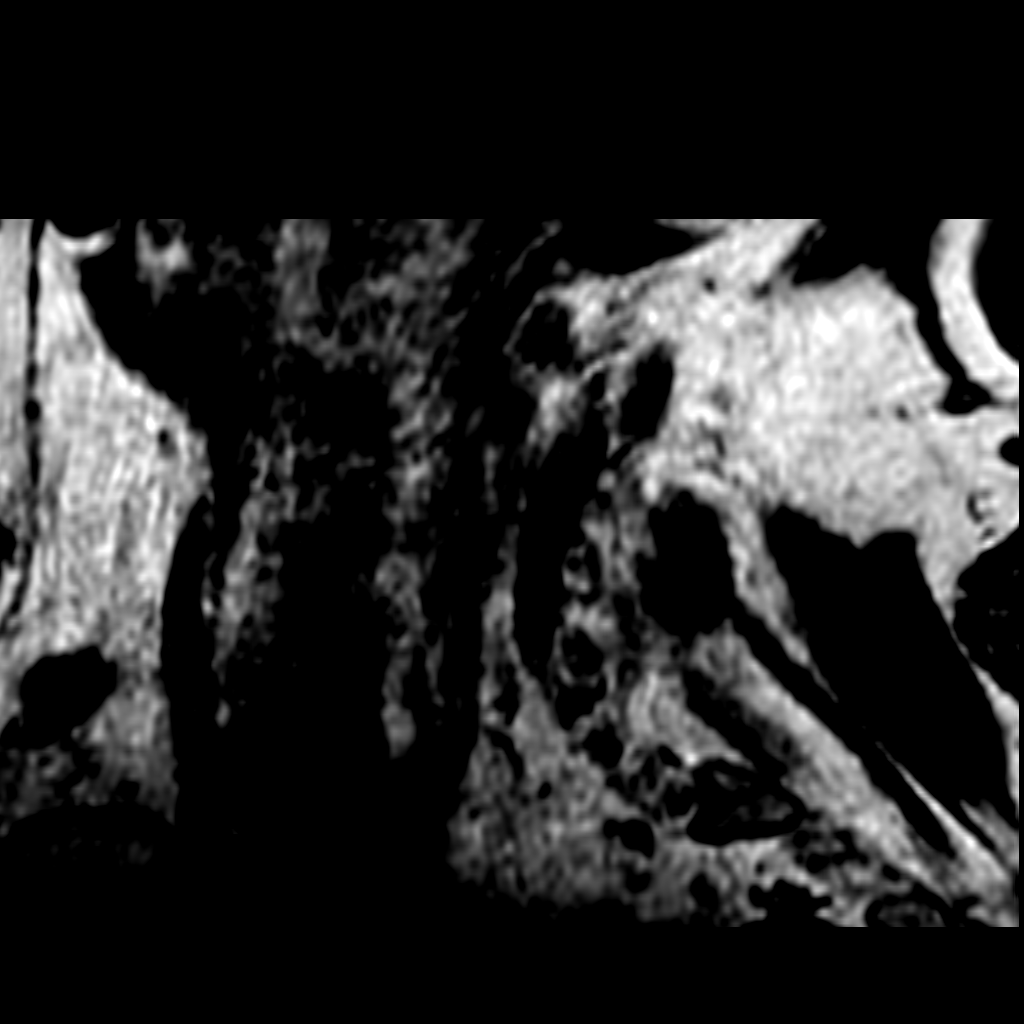

[Series 605: csp right oblq · oblique · 1.0mm · 0.15mm/px · 2 of 36 slices shown]
[im 1/36]
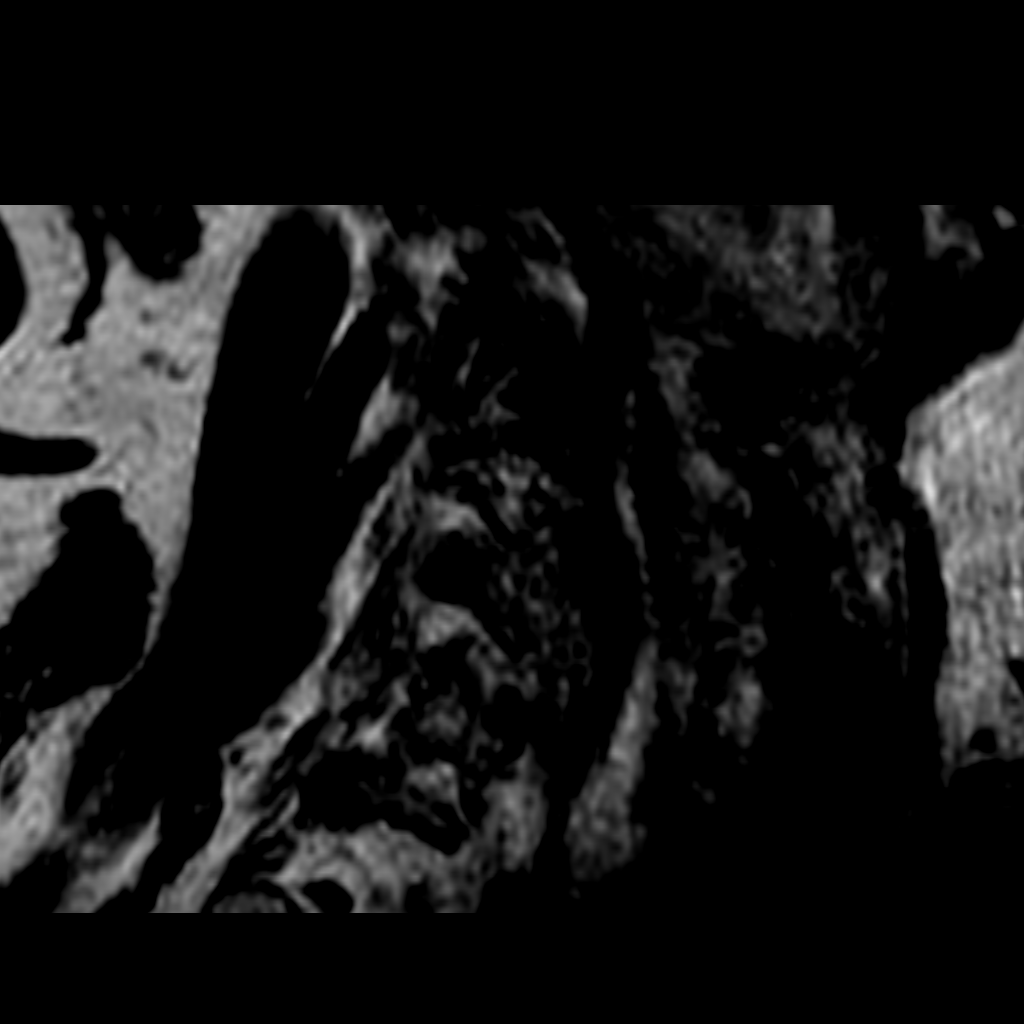
[im 36/36]
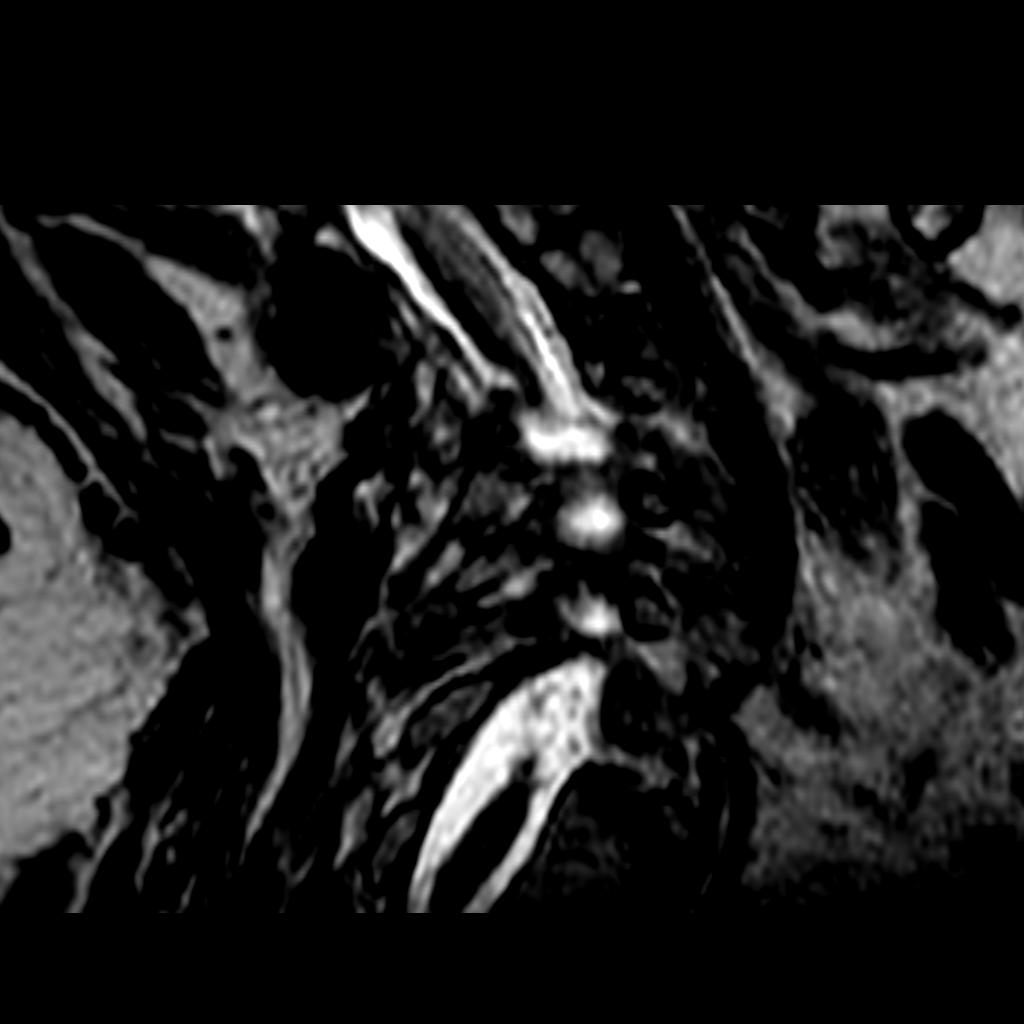

[Series 802: (person_name)_(person_name)_stack · axial · 3.0mm · 0.45mm/px · 1 of 30 slices shown]
[im 1/30]
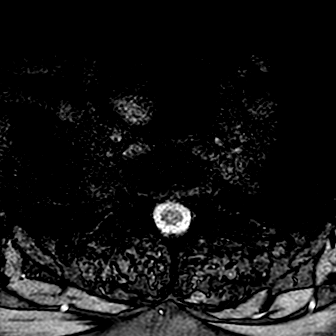

[Series 901: T2 · oblique · 3.0mm · 0.29mm/px · 1 of 18 slices shown]
[im 1/18]
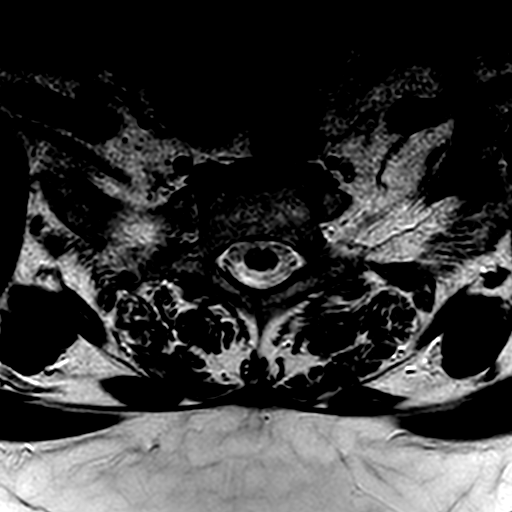

[Series 1002: (id)_mdixon_tse · sagittal · 3.0mm · 0.35mm/px · 1 of 15 slices shown (2 of 2)]
[im 1/15]
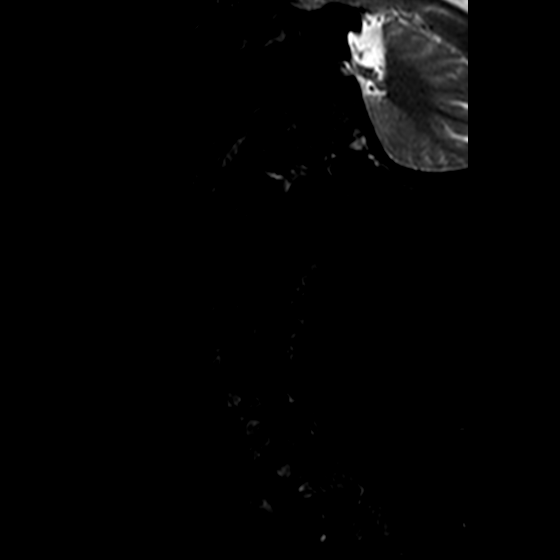

[12 of 48 positions shown; findings below may reference images not displayed]

FINDINGS: -------------------------------------------------------------------------------- 
----------------- 
GENERAL: 
ALIGNMENT: Normal coronal alignment. Grade 1 retrolisthesis C3 on C4 and C4 on 
C5. 
VERTEBRAL BODY HEIGHT: Normal.  
MARROW SIGNAL: No focal suspect signal abnormality. 
CORD SIGNAL: Normal.  
ADDITIONAL FINDINGS: None. 
-------------------------------------------------------------------------------- 
---------------- 
SEGMENTAL: 
CRANIOCERVICAL JUNCTION: No significant stenosis. 
C2-C3: Posterior loss of disc height. Canal and foramina are patent. Normal 
facets. 
C3-C4: Slight posterior loss of disc height. Slight ligamentum flavum 
hypertrophy. Canal patent. Left-sided uncinate spurring with normal facets. Mild 
right and left foraminal narrowing. 
C4-C5: Normal disc height. Canal patent. Slight ligamentum flavum hypertrophy. 
Facet arthropathy. Patent right foramen. Mild left foraminal narrowing. 
C5-C6: Ligamentum flavum hypertrophy. Normal disc height. Mild canal stenosis 
with ventral cord flattening. Normal facets. Right foramen patent. Mild left 
foraminal narrowing. 
C6-C7: Posterior loss of disc height. Ligamentum flavum hypertrophy. Disc 
osteophyte complex flattens the ventral cord margin with borderline canal 
stenosis. Bilateral uncinate spurring. Normal facets. Mild bilateral foraminal 
narrowing. 
C7-T1: Normal disc height. No herniation. Normal facets. No spinal canal or 
neural foraminal stenosis. 
-------------------------------------------------------------------------------- 
---------------
IMPRESSION: Motion artifact on several of the sequences degrades the study. 
Cervical spondylosis but without significant canal stenosis. There is ventral 
cord flattening C5-C6 and C6-C7. Mild degrees of foraminal narrowing as detailed 
above.

## 2024-03-20 MED ORDER — METOPROLOL SUCCINATE ER 25 MG PO TB24
25 | ORAL_TABLET | Freq: Every day | ORAL | 1 refills | Status: DC
Start: 2024-03-20 — End: 2024-08-18

## 2024-03-20 NOTE — Telephone Encounter (Signed)
 Name of Medication(s) Requested:  Requested Prescriptions     Pending Prescriptions Disp Refills    metoprolol succinate (TOPROL XL) 25 MG extended release tablet 90 tablet 1     Sig: Take 1 tablet by mouth daily       Medication is on current medication list Yes    Dosage and directions were verified? Yes    Quantity verified: 90 day supply     Pharmacy Verified?  Yes    Last Appointment:  09/06/2023    Future appts:  Future Appointments   Date Time Provider Department Center   05/28/2024 11:30 AM Coalmer, Karin Golden, APRN - CNS Surg [facility]        (If no appt send self scheduling link. Marland KitchenREFILLAPPT)  Scheduling request sent?     []  Yes  []  No    Does patient need updated?  []  Yes  [x]  No

## 2024-05-28 ENCOUNTER — Ambulatory Visit
Admit: 2024-05-28 | Discharge: 2024-05-28 | Payer: Medicare (Managed Care) | Attending: Adult Health | Primary: Internal Medicine

## 2024-05-28 VITALS — BP 143/63 | HR 63 | Temp 97.30000°F | Ht 66.0 in | Wt 232.8 lb

## 2024-05-28 DIAGNOSIS — I25709 Atherosclerosis of coronary artery bypass graft(s), unspecified, with unspecified angina pectoris: Secondary | ICD-10-CM

## 2024-05-28 MED ORDER — SEMAGLUTIDE-WEIGHT MANAGEMENT 0.25 MG/0.5ML SC SOAJ
0.25 | SUBCUTANEOUS | 0 refills | 28.00000 days | Status: DC
Start: 2024-05-28 — End: 2024-07-04

## 2024-05-28 MED ORDER — SEMAGLUTIDE-WEIGHT MANAGEMENT 0.5 MG/0.5ML SC SOAJ
0.5 | SUBCUTANEOUS | 2 refills | 30.00000 days | Status: DC
Start: 2024-05-28 — End: 2024-07-02

## 2024-05-28 NOTE — Patient Instructions (Addendum)
 Patient Instructions:    [email or web address]    Start Wegovy  by taking 0.25 mg every 7 days for four weeks, then increase to 0.5 mg every 7 days; at the end of four weeks contact me for the next prescription    I have reviewed the risks and the benefits of Wegovy . Patient continues to report no personal or family history of medullary thyroid carcinoma or multiple endocrine neoplasm type II. He was provided with tips for managing nausea, eating a bland , low fat food diet, eating food that contain water , avoid laying down after eating, going outside for fresh air. Patient was in agreement with this plan and did not have any further questions at this time.    ----------------------------------------------------------------------------------------------------------------------------------    Limit sodium to 2, 000 mg sodium     Eat on a 7"plate, level the food off (do not mound it up) and do not go back for seconds.     Make at least one-half of the plate non-starchy vegetables.     Limit starchy vegetables and grains to 1/4 - 1/2 of the plate     Limit the entree to no more than 1/4 of the plate    -----------------------------------------------------------------------------------------  Rules:  Use the plate method. Measure serving size  Limit sweets to one day per month  Limit chips/crackers/pretzels/nuts/popcorn to 100 -150 cal/day  Eliminate all sugar sweetened beverages (including fruit juice)  Limit restaurants (including fast food and food from a convenience store) to one time every two weeks while in town    Requirements:  Make sure protein intake is at least 79 grams per day (do not count protein every day; instead spot check your intake every 2-3 weeks and make sure what you think you are getting is close to accurate; consider using a protein shake if needed; these are in the pharmacy section of the stores, not the grocery section; Premier, Pure Protein and Fairlife are relatively inexpensive and taste good to  most patients; other options are Nectar, Boost Max, Ensure Max, BeneProtein and GNC lean (which is lactose-free);   Nectar fruit, Premier Protein Clear, IsoPure Protein Drink, and Protein 2 O are water -based options; Quest (or Cosco, which is cheaper and is ordered on Amazon) and the Oh Yeah 1 protein bars can also be used, but have less protein in them )      (Disclaimer: Dietary supplements rarely have their listed ingredients and the amount of each verified by a third party other. Sometimes they give verification for their claims to be GMO and gluten free and to be organic. However, even such verifications as these may still be untrustworthy.)    Make sure fiber intake is at least 22 grams/day. Do this in part or whole by taking 12 tablespoons of [employer] Fiber One original, plain cereal (or Kellogg's All Bran Buds cereal) or 4 tablespoons of wheat dextrin powder (Benefiber or generic brand). For both of these, start with 1/8th - 1/4th the target amount and every week add another 1/8th - 1/4th until reaching the target).  Also, fiber gummies containing inulin (such as Elaina Graver, [place], [place]) or Fiber Choice Pre-biotic tablets containing inulin are options. 1 cup of beans or peas is an excellent food source of fiber. Low calorie, high fiber bread products containing modified wheat starch can be used. An example is the [place] [employer] Xtreme Wellness High Fiber Carb Lean tortilla (7grams in 30 calories).  All of these fiber supplements are for the health of the  colon. Their purpose is not to prevent or treat constipation.        Drink at least 64 oz of water  each day  Take one multivitamin every day    Targets:  Limit calorie intake to 1400 calories/day  Continue to walk 30 minutes daily  Avoid eating 2 hours within bedtime.     Tips:  Do not eat outside of the dining room or the kitchen  Do not eat while watching TV, videos, working on the computer or using a smart phone  Do not eat food out of a  multi-serving bag or container.  Establish 6 hours of food-free "time-out" periods (times you don't eat) each day. No period can be less than 1 hour long. The periods need to be the same every day for days that are the same (for example, workdays would have one set of food free periods and weekends would have another set of days). These six hours are in addition to the two hours before bedtime and the time spent sleeping.    Food Resources :    Protein options (20-30 grams protein): 1 cup of low fat cottage cheese (180 cal/20 grams protein), 6 egg whites + 1 whole egg (170 cal, 24 grams of protein), 3 oz of chicken (130 cal, 25 grams protein), low fat, high protein [place] yogurt (240 cal, 30 grams protein),  4 oz.Tempeh (193 Cal/ 18 grams protein). 1 cup tofu ( 182 Cal/ 20 grams protein). 1/2 cup seitan ( 169 Ca/ 30 grams protein)    Principle sources of protein:    Lean meat    Low fat dairy    Egg whites    Beans and legumes     Principle sources of fiber:    Beans (not green beans), legumes and fruit    Examples of fruit are:    Blackberries 144g serving, 62 cal, 8g fiber    Blueberries 148g serving, 80 cal, 4g fiber    Strawberries 166g serving, 54 cal, 3g fiber    Bananas 118g serving, 105 cal, 3g fiber    Gala apples 172g serving, 98 cal, 4g fiber     Other sources of fiber:    Almonds  28g serving, 170 cal, 3g fiber    Walnuts 28g serving, 182 cal, 2g fiber    Pumpkin seeds 31g, 180 cal, 3g fiber    Sunflower seeds 30g, 180 cal, 2g fiber     Low calorie non-starchy vegetables:  Food (per 100 g) Calories Fibers T. Carbohydrates Protein Fat Sodium (mg) Potassium (mg)   Green Bell Peppers 10 1.7 4.6 0.9 0.2 3 175   Cucumbers 15 0.5 3.6 0.7 0.1 2 147   Celery 16 1.6 3 0.7 0.2 80 260   Tomatoes 18 1.2 3.9 0.9 0.2 5 237   Carrots 41 2.8 10 0.9 0.2 69 320   Cabbage 25 2.5 6 1.3 0.1 18 170   Broccoli 35 3.3 7.2 2.4 0.4 41 293   Cauliflower 23 2.3 4.1 1.8 0.5 15 142   Iceberg Lettuce 14 1.2 3 0.9 0.1 10 141   Kale 28 2  5.6 1.9 0.4 23 228   Brussel Sprouts 43 3.8 9 3.4 0.3 25 389   Spinach 23 2.4 3.8 3 0.3 70 466   Zucchini 15 1 2.7 1.1 0.4 3 264   Mushroom 28 2.2 5.3 2.2 0.5 2 356   Asparagus 22 2 4.1 2.4 0.2 14 224   Green Beans 35 3.2  7.9 1.9 0.3 1 146   Eggplant 35 2.5 8.7 0.8 0.2 1 123

## 2024-05-28 NOTE — Progress Notes (Signed)
 CC -   HTN, Obesity    BACKGROUND -   Last visit: 11/02/2023  First visit: 08/23/2023    Obesity   Began about 10 years ago recently  gotten worse   Initial BMI 38.06, Wt 236.8 lbs Ht 5' 6.14"  HS Grad wt does not remember    Lowest   wt does not remember   Highest  wt 236.8 lbs  Pattern of wt gain: gradual   Wt change past yr: gained 12 lbs  Most wt lost: 25 lbs Atkins  Other diets attempted: Michaelyn Adu     Desire to lose weight: 10/10  Problem posed by appetite: 7-8/10    Initial Diet:    Number of meals per day - 3    Number of snacks per day - 2    Meal volume - 10" plate,  sometimes seconds    Fast food/convenience store - 1-2 x/week    Restaurants (not fast food) - 3 x/week   Sweets - 7 d/week cake, ice cream, reeces   Chips - 1 d/week handful   Crackers/pretzels - 0 d/week   Nuts - 3 d/week cashews , peanuts handful    Peanut Butter - 3 d/week toast    Popcorn - 0 d/week   Dried fruit - 0 d/week   Whole fruit - 3-4 d/week   Breakfast cereal - 0 d/week   [place]/Protein/Energy bar - 0 d/week   Sugar sweetened beverages - coffee SF creamer, OJ  2 x week, lemonade 1 glass, 2-3 shot vodka,    Protein - No supplements   Fiber - No supplements   Multivitamin -None   Exercise:    Gym membership - no    Walking - yes 30 minutes  every day     Running - no    Resistance - no    Aerobic class - no  ____________________________    [facility] -  Past Medical History:   Diagnosis Date    Atrial fibrillation (HCC)     Has a Watchman device    GERD (gastroesophageal reflux disease)     Hyperlipidemia     Hypertension     Sleep apnea     "uses the mouth piece"     Current Outpatient Medications   Medication Sig Dispense Refill    acetaminophen  (TYLENOL ) 325 MG tablet Acetaminophen  325 MG Oral Tablet QTY: 0 tablet Days: 0 Refills: 0  Written: 11/29/23 Patient Instructions:      Semaglutide -Weight Management (WEGOVY ) 0.25 MG/0.5ML SOAJ SC injection Inject 0.25 mg into the skin every 7 days 2 mL 0    [START ON 06/18/2024]  Semaglutide -Weight Management (WEGOVY ) 0.5 MG/0.5ML SOAJ SC injection Inject 0.5 mg into the skin every 7 days 2 mL 2    metoprolol  succinate (TOPROL  XL) 25 MG extended release tablet Take 1 tablet by mouth daily 90 tablet 1    furosemide  (LASIX ) 40 MG tablet TAKE 1 TABLET BY MOUTH DAILY 90 tablet 0    potassium chloride  (KLOR-CON  M) 10 MEQ extended release tablet Take 1 tablet by mouth three times a week Sunday, Tuesday, Thursday 60 tablet 0    atorvastatin  (LIPITOR ) 20 MG tablet Take 1 tablet by mouth nightly 90 tablet 0    omeprazole  (PRILOSEC) 20 MG delayed release capsule Take 1 capsule by mouth daily 90 capsule 0    tadalafil  (CIALIS ) 20 MG tablet Take 1 tablet by mouth as needed for Erectile Dysfunction 15 tablet 0    spironolactone  (ALDACTONE )  25 MG tablet Take 1 tablet by mouth daily 90 tablet 0    celecoxib  (CELEBREX ) 200 MG capsule Take 1 capsule by mouth 2 times daily       No current facility-administered medications for this visit.      Review of Systems   Constitutional:  Negative for fatigue.   Respiratory:  Negative for shortness of breath.    Cardiovascular:  Negative for chest pain and palpitations.   Gastrointestinal:  Negative for constipation, nausea and vomiting.   Psychiatric/Behavioral:  Negative for sleep disturbance. The patient is not nervous/anxious.      BP (!) 143/63 (BP Site: Left Upper Arm, Patient Position: Sitting, BP Cuff Size: Large Adult)   Pulse 63   Temp 97.3 F (36.3 C) (Temporal)   Ht 1.676 m (5\' 6" )   Wt 105.6 kg (232 lb 12.8 oz)   BMI 37.57 kg/m     Physical Exam  Vitals reviewed.   Constitutional:       Appearance: He is obese.   Cardiovascular:      Rate and Rhythm: Normal rate and regular rhythm.      Heart sounds: Normal heart sounds. No murmur heard.  Pulmonary:      Effort: Pulmonary effort is normal. No respiratory distress.   Musculoskeletal:      Right lower leg: Edema present.      Left lower leg: Edema present.      Comments: Reddened taut skin     Neurological:      Mental Status: He is alert and oriented to person, place, and time.   Psychiatric:         Mood and Affect: Mood normal.         Behavior: Behavior normal.       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   (!) 143/63 BP today  Home Monitoring: occasionally takes it   Regimen metoprolol  25 mg daily, furosemide  40 mg daily,  atorvastatin  20 mg daily  *controlled on current medication, patient asymptomatic.  Assessment  - Controlled Weight reduction can help.excess wt is contributing to the severity of his  CADand the intensity of anti-hypertensive therapy needed to control it    Plan  - Follow up with cardiologist  in [place] Continue medication.  Weight reduction per plan below    Problem 1- Hypertension   HPI -Diagnosed over 10 years ago  145/55 BP today  Home Monitoring: random spot checks wnl  Regimen losartan  100 mg daily  *controlled on current medication, patient asymptomatic.  Assessment  - Controlled. Weight reduction can help.excess wt is contributing to the severity of his HTN and the intensity of anti-hypertensive therapy needed to control it    Plan  - Continue medication.  He is following with his PCP Weight reduction per plan below    Problem 2 -  Obesity  HPI - See above Background for description  Weight   Date   236.8 lbs  08/23/2023   227 lbs   09/19/2023   221 lbs   11/02/2023   232.8 lbs  05/28/2024    Total weight change to date: -4 lbs.     Patient's estimated daily energy need (DEN) = 1976 Cal/d     Average daily energy variance:  08/23/2023 - 09/19/2023: -9.8 lbs /27 d = -1270 Cal/d deficit. (4.1% change in total body weight)  09/19/2023 - 11/02/2023: -6.0 lbs /44 d = -477 Cal/d deficit. (6.7% change in total body weight)    Weight effect of high risk foods =  Fast food 675 Cal/week + Sweets 710 cal +  Nuts 495 cal + PB 570 Cal + SSB 1244 =3694 Cal/week =528 cal/day = 27 % DEN = 53 lbs/year    Notes from previous visit    Talked about various options. Does not want VLCD. Would like portion controlled low calorie diet as detailed below. Would like an appetite suppressant, and after talking about options and side effects, opted for Wegovy  -  He is not a candidate for phentermine given his significant cardiac history.  If PA denied, we may try bupropion     We will add bupropion  to help with appetite suppression . The current medical regimen is effective;  continue present plan.    When he was in [place]  , he had chest pain and numbness in righ hand.. He had an MRI of the heart . He is having cardiac cath next Thursday in [place] . He also had issue with his neck and was on a round of steroids.     Update:  He spent the winter in [place] . He had open heart surgery Nov 21 quadruple bypass. He was on steroids for a few months. He just weaned off of them 2-3 weeks ago. He does have some SOB Had hives and angioedema and stopped taking bupropion   Calorie monitoring: Portion control   Protein: pork chop,  cottage cheese, eggs, fish, protein shake, sausage   Fiber: fiber one cereal  , banana, green beans, wrap , peas, corn, vegetable   Water : 32 oz/day  Micronutrients: none  Sweets:1 d/week cookies   Non-sweet snacks: 1 d/week handful   SSB: 1 shot vodka  3-4 x week   Restaurant food: 2 x week   Appetite suppressant: none  Exercise:  none     Assessment -  Worsening. He underwent quadruple bypass November 2024.  He is struggling with snacking and eating larger portion. Given his significant cardiac history  we decided on Wegovy . He was counselled on no added salt diet and fluid requirements with his cardiac history.     Plan -     Patient Instructions:    Start Wegovy  by taking 0.25 mg every 7 days for four weeks, then increase to 0.5 mg every 7 days; at the end of four weeks contact me for the next prescription    I have  reviewed the risks and the benefits of Wegovy . Patient continues to report no personal or family history of medullary thyroid carcinoma or multiple endocrine neoplasm type II. He was provided with tips for managing nausea, eating a bland , low fat food diet, eating food that contain water , avoid laying down after eating, going outside for fresh air. Patient was in agreement with this plan and did not have any further questions at this time.    ----------------------------------------------------------------------------------------------------------------------------------    Limit sodium to 2, 000 mg sodium     Eat on a 7"plate, level the food off (do not mound it up) and do not go back for seconds.     Make at least one-half of the plate non-starchy vegetables.     Limit starchy vegetables and grains to 1/4 - 1/2 of the plate     Limit the entree to no more than  1/4 of the plate    -----------------------------------------------------------------------------------------  Rules:  Use the plate method. Measure serving size  Limit sweets to one day per month  Limit chips/crackers/pretzels/nuts/popcorn to 100 -150 cal/day  Eliminate all sugar sweetened beverages (including fruit juice)  Limit restaurants (including fast food and food from a convenience store) to one time every two weeks while in town    Requirements:  Make sure protein intake is at least 79 grams per day (do not count protein every day; instead spot check your intake every 2-3 weeks and make sure what you think you are getting is close to accurate; consider using a protein shake if needed; these are in the pharmacy section of the stores, not the grocery section; Premier, Pure Protein and Fairlife are relatively inexpensive and taste good to most patients; other options are Nectar, Boost Max, Ensure Max, BeneProtein and GNC lean (which is lactose-free);   Nectar fruit, Premier Protein Clear, IsoPure Protein Drink, and Protein 2 O are water -based  options; Quest (or Cosco, which is cheaper and is ordered on Amazon) and the Oh Yeah 1 protein bars can also be used, but have less protein in them )      (Disclaimer: Dietary supplements rarely have their listed ingredients and the amount of each verified by a third party other. Sometimes they give verification for their claims to be GMO and gluten free and to be organic. However, even such verifications as these may still be untrustworthy.)    Make sure fiber intake is at least 22 grams/day. Do this in part or whole by taking 12 tablespoons of [employer] Fiber One original, plain cereal (or Kellogg's All Bran Buds cereal) or 4 tablespoons of wheat dextrin powder (Benefiber or generic brand). For both of these, start with 1/8th - 1/4th the target amount and every week add another 1/8th - 1/4th until reaching the target).  Also, fiber gummies containing inulin (such as Elaina Graver, [place], [place]) or Fiber Choice Pre-biotic tablets containing inulin are options. 1 cup of beans or peas is an excellent food source of fiber. Low calorie, high fiber bread products containing modified wheat starch can be used. An example is the [place] [employer] Xtreme Wellness High Fiber Carb Lean tortilla (7grams in 30 calories).  All of these fiber supplements are for the health of the colon. Their purpose is not to prevent or treat constipation.        Drink at least 64 oz of water  each day  Take one multivitamin every day    Targets:  Limit calorie intake to 1400 calories/day  Continue to walk 30 minutes daily  Avoid eating 2 hours within bedtime.     Tips:  Do not eat outside of the dining room or the kitchen  Do not eat while watching TV, videos, working on the computer or using a smart phone  Do not eat food out of a multi-serving bag or container.  Establish 6 hours of food-free "time-out" periods (times you don't eat) each day. No period can be less than 1 hour long. The periods need to be the same every day for days  that are the same (for example, workdays would have one set of food free periods and weekends would have another set of days). These six hours are in addition to the two hours before bedtime and the time spent sleeping.    Food Resources :    Protein options (20-30 grams protein): 1 cup of low fat cottage cheese (  180 cal/20 grams protein), 6 egg whites + 1 whole egg (170 cal, 24 grams of protein), 3 oz of chicken (130 cal, 25 grams protein), low fat, high protein [place] yogurt (240 cal, 30 grams protein),  4 oz.Tempeh (193 Cal/ 18 grams protein). 1 cup tofu ( 182 Cal/ 20 grams protein). 1/2 cup seitan ( 169 Ca/ 30 grams protein)    Principle sources of protein:    Lean meat    Low fat dairy    Egg whites    Beans and legumes     Principle sources of fiber:    Beans (not green beans), legumes and fruit    Examples of fruit are:    Blackberries 144g serving, 62 cal, 8g fiber    Blueberries 148g serving, 80 cal, 4g fiber    Strawberries 166g serving, 54 cal, 3g fiber    Bananas 118g serving, 105 cal, 3g fiber    Gala apples 172g serving, 98 cal, 4g fiber     Other sources of fiber:    Almonds  28g serving, 170 cal, 3g fiber    Walnuts 28g serving, 182 cal, 2g fiber    Pumpkin seeds 31g, 180 cal, 3g fiber    Sunflower seeds 30g, 180 cal, 2g fiber     Low calorie non-starchy vegetables:  Food (per 100 g) Calories Fibers T. Carbohydrates Protein Fat Sodium (mg) Potassium (mg)   Green Bell Peppers 10 1.7 4.6 0.9 0.2 3 175   Cucumbers 15 0.5 3.6 0.7 0.1 2 147   Celery 16 1.6 3 0.7 0.2 80 260   Tomatoes 18 1.2 3.9 0.9 0.2 5 237   Carrots 41 2.8 10 0.9 0.2 69 320   Cabbage 25 2.5 6 1.3 0.1 18 170   Broccoli 35 3.3 7.2 2.4 0.4 41 293   Cauliflower 23 2.3 4.1 1.8 0.5 15 142   Iceberg Lettuce 14 1.2 3 0.9 0.1 10 141   Kale 28 2 5.6 1.9 0.4 23 228   Brussel Sprouts 43 3.8 9 3.4 0.3 25 389   Spinach 23 2.4 3.8 3 0.3 70 466   Zucchini 15 1 2.7 1.1 0.4 3 264   Mushroom 28 2.2 5.3 2.2 0.5 2 356   Asparagus 22 2 4.1 2.4 0.2 14 224    Green Beans 35 3.2 7.9 1.9 0.3 1 146   Eggplant 35 2.5 8.7 0.8 0.2 1 123     Return in about 2 months (around 07/28/2024).     Total time spent on encounter:20 minutes     Darnella Elder, APRN - CNS  Obesity  11/02/2023

## 2024-06-04 MED ORDER — FUROSEMIDE 40 MG PO TABS
40 | ORAL_TABLET | Freq: Every day | ORAL | 1 refills | 30.00000 days | Status: DC
Start: 2024-06-04 — End: 2024-08-18

## 2024-06-04 NOTE — Telephone Encounter (Signed)
 Name of Medication(s) Requested:  Requested Prescriptions     Pending Prescriptions Disp Refills    furosemide  (LASIX ) 40 MG tablet 90 tablet 1     Sig: Take 1 tablet by mouth daily       Medication is on current medication list Yes    Dosage and directions were verified? Yes    Quantity verified: 90 day supply     Pharmacy Verified?  Yes    Last Appointment:  09/06/2023    Future appts:  Future Appointments   Date Time Provider Department Center   08/21/2024  9:30 AM Coalmer, Milton Alpers, APRN - CNS Surg Weight [facility]        (If no appt send self scheduling link. [email or web address])  Scheduling request sent?     []  Yes  []  No    Does patient need updated?  []  Yes  []  No

## 2024-06-29 ENCOUNTER — Encounter

## 2024-06-30 NOTE — Telephone Encounter (Signed)
 Name of Medication(s) Requested:  Requested Prescriptions     Pending Prescriptions Disp Refills    potassium chloride  (KLOR-CON ) 10 MEQ extended release tablet [Pharmacy Med Name: POTASSIUM CL ER TABLETS] 60 tablet 1     Sig: TAKE 1 TABLET BY MOUTH EVERY OTHER DAY       Medication is on current medication list Yes    Dosage and directions were verified? Yes    Quantity verified: 90 day supply     Pharmacy Verified?  Yes    Last Appointment:  09/06/2023    Future appts:  Future Appointments   Date Time Provider Department Center   08/21/2024  9:30 AM Coalmer, Norvel, APRN - CNS Surg [facility]   09/04/2024 12:00 PM Lanie Metro, MD [facility]        (If no appt send self scheduling link. [email or web address])  Scheduling request sent?     []  Yes  []  No    Does patient need updated?  []  Yes  [x]  No

## 2024-07-02 ENCOUNTER — Encounter

## 2024-07-02 MED ORDER — SEMAGLUTIDE-WEIGHT MANAGEMENT 0.5 MG/0.5ML SC SOAJ
0.5 | SUBCUTANEOUS | 1 refills | 28.00000 days | Status: DC
Start: 2024-07-02 — End: 2024-07-04

## 2024-07-02 NOTE — Telephone Encounter (Signed)
 Please look at Wegovy  denial, its the .5mg  denial

## 2024-07-03 ENCOUNTER — Inpatient Hospital Stay: Payer: Medicare (Managed Care) | Primary: Internal Medicine

## 2024-07-03 DIAGNOSIS — I501 Left ventricular failure: Principal | ICD-10-CM

## 2024-07-03 LAB — COMPREHENSIVE METABOLIC PANEL
ALT: 16 U/L (ref 0–50)
AST: 18 U/L (ref 0–50)
Albumin: 3.7 g/dL (ref 3.5–5.2)
Alkaline Phosphatase: 67 U/L (ref 40–129)
Anion Gap: 10 mmol/L (ref 7–16)
BUN: 15 mg/dL (ref 8–23)
CO2: 25 mmol/L (ref 22–29)
Calcium: 8.6 mg/dL — ABNORMAL LOW (ref 8.8–10.2)
Chloride: 103 mmol/L (ref 98–107)
Creatinine: 1.1 mg/dL (ref 0.7–1.2)
Est, Glom Filt Rate: 67 mL/min/1.73m2 (ref >60)
Glucose: 100 mg/dL — ABNORMAL HIGH (ref 74–99)
Potassium: 4.2 mmol/L (ref 3.5–5.1)
Sodium: 138 mmol/L (ref 136–145)
Total Bilirubin: 0.4 mg/dL (ref 0.0–1.2)
Total Protein: 6.9 g/dL (ref 6.4–8.3)

## 2024-07-04 ENCOUNTER — Ambulatory Visit
Admit: 2024-07-04 | Discharge: 2024-07-04 | Payer: Medicare (Managed Care) | Attending: Internal Medicine | Primary: Internal Medicine

## 2024-07-04 ENCOUNTER — Encounter

## 2024-07-04 VITALS — BP 116/80 | HR 71 | Temp 98.50000°F | Ht 65.98 in | Wt 238.5 lb

## 2024-07-04 DIAGNOSIS — I5023 Acute on chronic systolic (congestive) heart failure: Principal | ICD-10-CM

## 2024-07-04 MED ORDER — METOLAZONE 2.5 MG PO TABS
2.5 | ORAL_TABLET | ORAL | 0 refills | Status: DC
Start: 2024-07-04 — End: 2024-07-07

## 2024-07-04 MED ORDER — LOSARTAN POTASSIUM 25 MG PO TABS
25 | ORAL_TABLET | Freq: Every day | ORAL | 0 refills | 90.00000 days | Status: DC
Start: 2024-07-04 — End: 2024-10-01

## 2024-07-04 MED ORDER — POTASSIUM CHLORIDE CRYS ER 10 MEQ PO TBCR
10 | ORAL_TABLET | Freq: Every day | ORAL | 0 refills | 30.00000 days | Status: AC
Start: 2024-07-04 — End: 2024-10-02

## 2024-07-04 NOTE — Progress Notes (Signed)
 Chief Complaint   Patient presents with    Congestive Heart Failure       HPI:  Patient is here for follow-up .  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    Review of Systems:  Constitutional:  No fever, no fatigue, no chills, no headaches, no weight change  Dermatology:  No rash, no mole, no dry or sensitive skin  ENT:  No cough, no sore throat, no sinus pain, no runny nose, no ear pain  Cardiology:  No chest pain, no palpitations, no leg edema, no shortness of breath, no PND  Gastroenterology:  No dysphagia, no abdominal pain, no nausea, no vomiting, no constipation, no diarrhea, no heartburn  Musculoskeletal:  No joint pain, no leg cramps, no back pain, no muscle aches  Respiratory:  No shortness of breath, no orthopnea, no wheezing, no DOE, no hemoptysis  Urology:  No blood in the urine, no urinary frequency, no urinary incontinence, no urinary urgency, no nocturia, no dysuria    Vitals:    07/04/24 1556   BP: 116/80   Pulse: 71   Temp: 98.5 F (36.9 C)   SpO2: 97%   Weight: 108.2 kg (238 lb 8 oz)   Height: 1.676 m (5' 5.98)       General:  Patient alert and oriented x 3, NAD, pleasant  HEENT:  Atraumatic, normocephalic, PERRLA, EOMI, clear conjunctiva, TMs clear, nose-clear, throat - no erythema  Neck:  Supple, no goiter, no carotid bruits, no LAD  Lungs:  CTA   Heart:  RRR, no murmurs, gallops or  rubs  Abdomen:  Soft/nt/nd, + bowel sounds  Lymph node examination: unremarkable  Neurological exam : unremarkable  Extremities:  No clubbing, cyanosis +1 bilateral leg edema+ skin oozing fluid on the left leg above the ankle  Skin: unremarkable    Hemoglobin A1C   Date Value Ref Range Status   09/05/2019 5.3 4.0 - 5.6 % Final     Cholesterol, Total   Date Value Ref Range Status   05/24/2023 126 <200 mg/dL Final     Triglycerides   Date Value Ref Range Status   05/24/2023 51 <150 mg/dL Final     HDL   Date Value Ref Range Status   05/24/2023 46 >40 mg/dL Final     VLDL   Date Value Ref Range Status   05/24/2023 10 mg/dL Final     Comment:     No normal range established.     Sodium   Date Value Ref Range Status   07/03/2024 138 136 - 145 mmol/L Final     Potassium   Date Value Ref Range Status   07/03/2024 4.2 3.5 - 5.1 mmol/L Final     Chloride   Date Value Ref Range Status   07/03/2024 103 98 - 107 mmol/L Final     CO2  Date Value Ref Range Status   07/03/2024 25 22 - 29 mmol/L Final     BUN   Date Value Ref Range Status   07/03/2024 15 8 - 23 mg/dL Final     Creatinine   Date Value Ref Range Status   07/03/2024 1.1 0.7 - 1.2 mg/dL Final     Glucose   Date Value Ref Range Status   07/03/2024 100 (H) 74 - 99 mg/dL Final     Calcium    Date Value Ref Range Status   07/03/2024 8.6 (L) 8.8 - 10.2 mg/dL Final     Total Bilirubin   Date Value Ref Range Status   07/03/2024 0.4 0.0 - 1.2 mg/dL Final     Alkaline Phosphatase   Date Value Ref Range Status   07/03/2024 67 40 - 129 U/L Final     AST   Date Value Ref Range Status   07/03/2024 18 0 - 50 U/L Final     ALT   Date Value Ref Range Status   07/03/2024 16 0 - 50 U/L Final     Est, Glom Filt Rate   Date Value Ref Range Status   07/03/2024 67 >60 mL/min/1.37m2 Final     Comment:           These results are not intended for use in patients <60 years of age.        eGFR results are calculated without a race factor using the 2021 CKD-EPI equation.  Careful clinical  correlation is recommended, particularly when comparing to results   calculated using previous equations.  The CKD-EPI equation is less accurate in patients with extremes of muscle mass, extra-renal   metabolism of creatine, excessive creatine ingestion, or following therapy that affects   renal tubular secretion.       GFR African American   Date Value Ref Range Status   10/04/2021 >60  Final        No results found.     No colonoscopy on file   No cologuard on file  No FIT/FOBT on file   No flexible sigmoidoscopy on file   No breast cancer screening on file  No cervical cancer screening on file      Assessment/Plan:    Acute on top of chronic systolic congestive heart failure  Add Zaroxolyn  2.5 mg tablet twice a week on Saturday and Tuesday of every week 1 hour before furosemide  dose  Add losartan  25 mg tablet daily  2D echo  Check lab work before next visit    Outpatient Encounter Medications as of 07/04/2024   Medication Sig Dispense Refill    ezetimibe (ZETIA) 10 MG tablet Take 1 tablet by mouth daily      atorvastatin  (LIPITOR ) 40 MG tablet Take 1 tablet by mouth daily      [START ON 07/07/2024] metOLazone  (ZAROXOLYN ) 2.5 MG tablet Take 1 tablet by mouth Twice a Week 24 tablet 0    losartan  (COZAAR ) 25 MG tablet Take 1 tablet by mouth daily 90 tablet 0    potassium chloride  (KLOR-CON  M) 10 MEQ extended release tablet Take 1 tablet by mouth daily 90 tablet 0    furosemide  (LASIX ) 40 MG tablet Take 1 tablet by mouth daily 90 tablet 1    omeprazole  (PRILOSEC) 20 MG delayed release capsule Take 1 capsule by mouth daily 90 capsule 0    tadalafil  (CIALIS ) 20 MG tablet Take 1 tablet by mouth as needed for Erectile Dysfunction 15 tablet 0    [  DISCONTINUED] Semaglutide -Weight Management (WEGOVY ) 0.5 MG/0.5ML SOAJ SC injection Inject 0.5 mg into the skin every 7 days 2 mL 1    [DISCONTINUED] acetaminophen  (TYLENOL ) 325 MG tablet Acetaminophen  325 MG Oral Tablet QTY: 0 tablet Days: 0 Refills: 0  Written: 11/29/23 Patient  Instructions:      [DISCONTINUED] Semaglutide -Weight Management (WEGOVY ) 0.25 MG/0.5ML SOAJ SC injection Inject 0.25 mg into the skin every 7 days 2 mL 0    metoprolol  succinate (TOPROL  XL) 25 MG extended release tablet Take 1 tablet by mouth daily 90 tablet 1    [DISCONTINUED] potassium chloride  (KLOR-CON  M) 10 MEQ extended release tablet Take 1 tablet by mouth three times a week Sunday, Tuesday, Thursday 60 tablet 0    [DISCONTINUED] atorvastatin  (LIPITOR ) 20 MG tablet Take 1 tablet by mouth nightly 90 tablet 0    [DISCONTINUED] spironolactone  (ALDACTONE ) 25 MG tablet Take 1 tablet by mouth daily 90 tablet 0    [DISCONTINUED] celecoxib  (CELEBREX ) 200 MG capsule Take 1 capsule by mouth 2 times daily       No facility-administered encounter medications on file as of 07/04/2024.        Corliss was seen today for congestive heart failure.    Diagnoses and all orders for this visit:    Acute on chronic systolic congestive heart failure (HCC)  -     Comprehensive Metabolic Panel; Future  -     CBC with Auto Differential; Future  -     metOLazone  (ZAROXOLYN ) 2.5 MG tablet; Take 1 tablet by mouth Twice a Week  -     Echo (TTE) complete (PRN contrast/bubble/strain/3D)  -     losartan  (COZAAR ) 25 MG tablet; Take 1 tablet by mouth daily  -     potassium chloride  (KLOR-CON  M) 10 MEQ extended release tablet; Take 1 tablet by mouth daily    Hyperlipidemia, unspecified hyperlipidemia type  -     Lipid Panel    Primary hypertension         There are no Patient Instructions on file for this visit.     On this date 07/04/2024 I have spent 30 minutes reviewing previous notes, test results and face to face with the patient discussing the diagnosis and importance of compliance with the treatment plan as well as documenting on the day of the visit.      Mara GORMAN Naval, MD   07/04/24

## 2024-07-05 ENCOUNTER — Encounter

## 2024-07-07 MED ORDER — METOLAZONE 2.5 MG PO TABS
2.5 | ORAL_TABLET | ORAL | 2 refills | 30.00000 days | Status: DC
Start: 2024-07-07 — End: 2024-08-18

## 2024-07-07 NOTE — Telephone Encounter (Signed)
 Name of Medication(s) Requested:  Requested Prescriptions     Pending Prescriptions Disp Refills    metOLazone  (ZAROXOLYN ) 2.5 MG tablet [Pharmacy Med Name: METOLAZONE  2.5MG  TABLETS] 25 tablet 2     Sig: TAKE 1 TABLET BY MOUTH 2 TIMES A WEEK       Medication is on current medication list Yes    Dosage and directions were verified? Yes    Quantity verified: 90 day supply     Pharmacy Verified?  Yes    Last Appointment:  07/04/2024    Future appts:  Future Appointments   Date Time Provider Department Center   08/18/2024 11:00 AM Bulah Mara RAMAN, MD [facility]   08/21/2024  9:30 AM Coalmer, Norvel, APRN - CNS Surg [facility]   09/04/2024 12:00 PM Lanie Metro, MD [facility]        (If no appt send self scheduling link. [email or web address])  Scheduling request sent?     []  Yes  []  No    Does patient need updated?  []  Yes  []  No

## 2024-07-09 NOTE — Telephone Encounter (Signed)
 Patient states he started water  pill lost 11 pounds in three days- took second pill and has lost nothing- wants to know if he can take water  pill 3 times a week to see better movement

## 2024-07-10 ENCOUNTER — Encounter

## 2024-07-16 NOTE — Telephone Encounter (Signed)
 Left message advising patient he can take 3 water  pills weekly

## 2024-07-31 ENCOUNTER — Inpatient Hospital Stay
Admit: 2024-07-31 | Discharge: 2024-08-04 | Payer: Medicare (Managed Care) | Attending: Internal Medicine | Primary: Internal Medicine

## 2024-07-31 VITALS — BP 116/80 | Ht 68.0 in | Wt 225.0 lb

## 2024-07-31 DIAGNOSIS — I5023 Acute on chronic systolic (congestive) heart failure: Principal | ICD-10-CM

## 2024-07-31 MED ORDER — SULFUR HEXAFLUORIDE MICROSPH 60.7-25 MG IJ SUSR
60.7-25 | Freq: Once | INTRAMUSCULAR | Status: AC | PRN
Start: 2024-07-31 — End: 2024-07-31
  Administered 2024-07-31: 19:00:00 2 mL via INTRAVENOUS

## 2024-07-31 MED ORDER — NORMAL SALINE FLUSH 0.9 % IV SOLN
0.9 | INTRAVENOUS | Status: DC | PRN
Start: 2024-07-31 — End: 2024-08-03
  Administered 2024-07-31: 19:00:00 10 mL via INTRAVENOUS

## 2024-08-01 LAB — ECHO (TTE) COMPLETE (PRN CONTRAST/BUBBLE/STRAIN/3D)
AR Max Velocity PISA: 2.8 m/s
AR PHT: 573.4 ms
AV Cusp Mmode: 1.9 cm
AV Mean Gradient: 4 mmHg
AV Mean Velocity: 1 m/s
AV Peak Gradient: 7 mmHg
AV Peak Velocity: 1.3 m/s
AV VTI: 26.7 cm
Body Surface Area: 2.21 m2
Est. RA Pressure: 3 mmHg
Fractional Shortening 2D: 21 % (ref 28–44)
IVSd: 1.6 cm — AB (ref 0.6–1.0)
LA Diameter: 4.5 cm
LA Size Index: 2.09 cm/m2
LA Volume A-L A4C: 62 mL — AB (ref 18–58)
LA Volume A-L A4C: 71 mL — AB (ref 18–58)
LA Volume A/L: 67 mL
LA Volume Index A-L A2C: 33 mL/m2 (ref 16–34)
LA Volume Index A-L A4C: 29 mL/m2 (ref 16–34)
LA Volume Index A/L: 31 mL/m2 (ref 16–34)
LA Volume Index MOD A2C: 32 mL/m2 (ref 16–34)
LA Volume Index MOD A4C: 27 mL/m2 (ref 16–34)
LA Volume MOD A2C: 68 mL — AB (ref 18–58)
LA Volume MOD A4C: 59 mL — AB (ref 18–58)
LV Mass 2D Index: 108 g/m2 (ref 49–115)
LV Mass 2D: 232.2 g — AB (ref 88–224)
LV RWT Ratio: 0.56
LVIDd Index: 2 cm/m2
LVIDd: 4.3 cm (ref 4.2–5.9)
LVIDs Index: 1.58 cm/m2
LVIDs: 3.4 cm
LVPWd: 1.2 cm — AB (ref 0.6–1.0)
MV A Velocity: 0.91 m/s
MV E Velocity: 0.72 m/s
MV E Wave Deceleration Time: 289.1 ms
MV E/A: 0.79
MV Max Velocity: 0.9 m/s
MV Mean Gradient: 1 mmHg
MV Mean Velocity: 0.5 m/s
MV Peak Gradient: 3 mmHg
MV VTI: 23.1 cm
RVIDd: 2.7 cm
RVSP: 27 mmHg
TR Max Velocity: 2.47 m/s
TR Peak Gradient: 24 mmHg

## 2024-08-18 ENCOUNTER — Ambulatory Visit
Admit: 2024-08-18 | Discharge: 2024-08-18 | Payer: Medicare (Managed Care) | Attending: Internal Medicine | Primary: Internal Medicine

## 2024-08-18 VITALS — BP 120/82 | HR 68 | Temp 98.30000°F | Ht 67.99 in | Wt 228.1 lb

## 2024-08-18 DIAGNOSIS — Z0000 Encounter for general adult medical examination without abnormal findings: Principal | ICD-10-CM

## 2024-08-18 MED ORDER — METOPROLOL SUCCINATE ER 25 MG PO TB24
25 | ORAL_TABLET | Freq: Every day | ORAL | 0 refills | Status: AC
Start: 2024-08-18 — End: 2024-11-16

## 2024-08-18 MED ORDER — METOLAZONE 2.5 MG PO TABS
2.5 | ORAL_TABLET | ORAL | 1 refills | Status: AC
Start: 2024-08-18 — End: ?

## 2024-08-18 MED ORDER — OMEPRAZOLE 20 MG PO CPDR
20 | ORAL_CAPSULE | Freq: Every day | ORAL | 0 refills | Status: AC
Start: 2024-08-18 — End: ?

## 2024-08-18 MED ORDER — FUROSEMIDE 40 MG PO TABS
40 | ORAL_TABLET | Freq: Two times a day (BID) | ORAL | 1 refills | Status: AC
Start: 2024-08-18 — End: ?

## 2024-08-18 NOTE — Patient Instructions (Signed)
 Learning About Being Active as an Older Adult  Why is being active important as you get older?     Being active is one of the best things you can do for your health. And it's never too late to start. Being active--or getting active, if you aren't already--has definite benefits. It can:  Give you more energy,  Keep your mind sharp.  Improve balance to reduce your risk of falls.  Help you manage chronic illness with fewer medicines.  No matter how old you are, how fit you are, or what health problems you have, there is a form of activity that will work for you. And the more physical activity you can do, the better your overall health will be.  What kinds of activity can help you stay healthy?  Being more active will make your daily activities easier. Physical activity includes planned exercise and things you do in daily life. There are four types of activity:  Aerobic.  Doing aerobic activity makes your heart and lungs strong.  Includes walking, dancing, and gardening.  Aim for at least 2 hours spread throughout the week.  It improves your energy and can help you sleep better.  Muscle-strengthening.  This type of activity can help maintain muscle and strengthen bones.  Includes climbing stairs, using resistance bands, and lifting or carrying heavy loads.  Aim for at least twice a week.  It can help protect the knees and other joints.  Stretching.  Stretching gives you better range of motion in joints and muscles.  Includes upper arm stretches, calf stretches, and gentle yoga.  Aim for at least twice a week, preferably after your muscles are warmed up from other activities.  It can help you function better in daily life.  Balancing.  This helps you stay coordinated and have good posture.  Includes heel-to-toe walking, tai chi, and certain types of yoga.  Aim for at least 3 days a week.  It can reduce your risk of falling.  Even if you have a hard time meeting the recommendations, it's better to be more active  than less active. All activity done in each category counts toward your weekly total. You'd be surprised how daily things like carrying groceries, keeping up with grandchildren, and taking the stairs can add up.  What keeps you from being active?  If you've had a hard time being more active, you're not alone. Maybe you remember being able to do more. Or maybe you've never thought of yourself as being active. It's frustrating when you can't do the things you want. Being more active can help. What's holding you back?  Getting started.  Have a goal, but break it into easy tasks. Small steps build into big accomplishments.  Staying motivated.  If you feel like skipping your activity, remember your goal. Maybe you want to move better and stay independent. Every activity gets you one step closer.  Not feeling your best.  Start with 5 minutes of an activity you enjoy. Prove to yourself you can do it. As you get comfortable, increase your time.  You may not be where you want to be. But you're in the process of getting there. Everyone starts somewhere.  How can you find safe ways to stay active?  Talk with your doctor about any physical challenges you're facing. Make a plan with your doctor if you have a health problem or aren't sure how to get started with activity.  If you're already active, ask your doctor if  there is anything you should change to stay safe as your body and health change.  If you tend to feel dizzy after you take medicine, avoid activity at that time. Try being active before you take your medicine. This will reduce your risk of falls.  If you plan to be active at home, make sure to clear your space before you get started. Remove things like TV cords, coffee tables, and throw rugs. It's safest to have plenty of space to move freely.  The key to getting more active is to take it slow and steady. Try to improve only a little bit at a time. Pick just one area to improve on at first. And if an activity hurts,  stop and talk to your doctor.  Where can you learn more?  Go to [employer] and enter P600 to learn more about Learning About Being Active as an Older Adult.  Current as of: July 25, 2023  Content Version: 14.5   [address] [place].   Care instructions adapted under license by [facility]. If you have questions about a medical condition or this instruction, always ask your healthcare professional. Romayne Alderman, [facility], disclaims any warranty or liability for your use of this information.         Hearing Loss: Care Instructions  Overview     Hearing loss is a sudden or slow decrease in how well you hear. It can range from slight to profound. Permanent hearing loss can occur with aging. It also can happen when you are exposed long-term to loud noise. Examples include listening to loud music, riding motorcycles, or being around other loud machines.  Hearing loss can affect your work and home life. It can make you feel lonely or depressed. You may feel that you have lost your independence. But hearing aids and other devices can help you hear better and feel connected to others.  Follow-up care is a key part of your treatment and safety. Be sure to make and go to all appointments, and call your doctor if you are having problems. It's also a good idea to know your test results and keep a list of the medicines you take.  How can you care for yourself at home?  Avoid loud noises whenever possible. This helps keep your hearing from getting worse.  Always wear hearing protection around loud noises.  Wear a hearing aid as directed.  A professional can help you pick a hearing aid that will work best for you.  You can also get hearing aids over the counter for mild to moderate hearing loss.  Have hearing tests as your doctor suggests. They can show whether your hearing has changed. Your hearing aid may need to be adjusted.  Use other devices as needed. These may include:  Telephone  amplifiers and hearing aids that can connect to a television, stereo, radio, or microphone.  Devices that use lights or vibrations. These alert you to the doorbell, a ringing telephone, or a baby monitor.  Television closed-captioning. This shows the words at the bottom of the screen. Most new TVs can do this.  TTY (text telephone). This lets you type messages back and forth on the telephone instead of talking or listening. These devices are also called TDD. When messages are typed on the keyboard, they are sent over the phone line to a receiving TTY. The message is shown on a monitor.  Use text messaging, social media, and email if it is hard for you to  communicate by telephone.  Try to learn a listening technique called speechreading. It is not lipreading. You pay attention to people's gestures, expressions, posture, and tone of voice. These clues can help you understand what a person is saying. Face the person you are talking to, and have them face you. Make sure the lighting is good. You need to see the other person's face clearly.  Think about counseling if you need help to adjust to your hearing loss.  When should you call for help?  Watch closely for changes in your health, and be sure to contact your doctor if:    You think your hearing is getting worse.     You have new symptoms, such as dizziness or nausea.   Where can you learn more?  Go to [employer] and enter R798 to learn more about Hearing Loss: Care Instructions.  Current as of: October 21, 2023  Content Version: 14.5   [address] [place].   Care instructions adapted under license by [facility]. If you have questions about a medical condition or this instruction, always ask your healthcare professional. Romayne Alderman, [facility], disclaims any warranty or liability for your use of this information.         Learning About Vision Tests  What are vision tests?     The four most common vision tests are visual  acuity tests, refraction, visual field tests, and color vision tests.  Visual acuity (sharpness) tests  These tests are used:  To see if you need glasses or contact lenses.  To monitor an eye problem.  To check an eye injury.  Visual acuity tests are done as part of routine exams. You may also have this test when you get your driver's license or apply for some types of jobs.  Visual field tests  These tests are used:  To check for vision loss in any area of your range of vision.  To screen for certain eye diseases.  To look for nerve damage after a stroke, head injury, or other problem that could reduce blood flow to the brain.  Refraction and color tests  A refraction test is done to find the right prescription for glasses and contact lenses.  A color vision test is done to check for color blindness.  Color vision is often tested as part of a routine exam. You may also have this test when you apply for a job where recognizing different colors is important, such as truck driving, Optician, dispensing, or the [employer].  How are vision tests done?  Visual acuity test   You cover one eye at a time.  You read aloud from a wall chart across the room.  You read aloud from a small card that you hold in your hand.  Refraction   You look into a special device.  The device puts lenses of different strengths in front of each eye to see how strong your glasses or contact lenses need to be.  Visual field tests   Your doctor may have you look through special machines.  Or your doctor may simply have you stare straight ahead while they move a finger into and out of your field of vision.  Color vision test   You look at pieces of printed test patterns in various colors. You say what number or symbol you see.  Your doctor may have you trace the number or symbol using a pointer.  How do these tests feel?  There is very little chance of  having a problem from this test. If dilating drops are used for a vision test, they may make the eyes sting  and cause a medicine taste in the mouth.  Follow-up care is a key part of your treatment and safety. Be sure to make and go to all appointments, and call your doctor if you are having problems. It's also a good idea to know your test results and keep a list of the medicines you take.  Where can you learn more?  Go to [employer] and enter G551 to learn more about Learning About Vision Tests.  Current as of: July 25, 2023  Content Version: 14.5   [address] [place].   Care instructions adapted under license by [facility]. If you have questions about a medical condition or this instruction, always ask your healthcare professional. Romayne Alderman, [facility], disclaims any warranty or liability for your use of this information.         Starting a Weight-Loss Plan: Care Instructions  Overview    It can be a challenge to lose weight. But your doctor can help you make a weight-loss plan that meets your needs.  You don't have to make a lot of big changes at once. A better idea might be to focus on small changes and stick with them. When those changes become habit, you can add a few more changes.  Some people find it helpful to take an exercise or nutrition class. If you have questions, ask your doctor about seeing a registered dietitian or an exercise specialist. You might also think about joining a weight-loss support group.  If you're not ready to make changes right now, try to pick a date in the future. Then make an appointment with your doctor to talk about when and how you'll get started with a plan.  Follow-up care is a key part of your treatment and safety. Be sure to make and go to all appointments, and call your doctor if you are having problems. It's also a good idea to know your test results and keep a list of the medicines you take.  How can you care for yourself as you start a weight-loss plan?   Set realistic goals. Many people expect to lose much more weight than is  likely. A weight loss of 5% to 10% of your body weight may be enough to improve your health.  Get family and friends involved to provide support. Talk to them about why you are trying to lose weight, and ask them to help. They can help by participating in exercise and having meals with you, even if they may be eating something different.  Find what works best for you. If you do not have time or do not like to cook, a program that offers meal replacement bars or shakes may be better for you. Or if you like to prepare meals, finding a plan that includes daily menus and recipes may be best.  Ask your doctor about other health professionals who can help you achieve your weight-loss goals.  A dietitian can help you make healthy changes in your diet.  An exercise specialist or personal trainer can help you develop a safe and effective exercise program.  A counselor or psychiatrist can help you cope with issues such as depression, anxiety, or family problems that can make it hard to focus on weight loss.  Consider joining a support group for people who are trying to lose weight. Your doctor can suggest groups in  your area.  Where can you learn more?  Go to [employer] and enter U357 to learn more about Starting a Weight-Loss Plan: Care Instructions.  Current as of: April 24, 2023  Content Version: 14.5   [address], [place].   Care instructions adapted under license by [facility]. If you have questions about a medical condition or this instruction, always ask your healthcare professional. Romayne Alderman, [facility], disclaims any warranty or liability for your use of this information.         A Healthy Heart: Care Instructions  Overview     Coronary artery disease, also called heart disease, occurs when a substance called plaque builds up in the vessels that supply oxygen-rich blood to your heart muscle. This can narrow the blood vessels and reduce blood flow. A heart attack happens  when blood flow is completely blocked. A high-fat diet, smoking, and other factors increase the risk of heart disease.  Your doctor has found that you have a chance of having heart disease. A heart-healthy lifestyle can help keep your heart healthy and prevent heart disease. This lifestyle includes eating healthy, being active, staying at a weight that's healthy for you, and not smoking or using tobacco. It also includes taking medicines as directed, managing other health conditions, and trying to get a healthy amount of sleep.  Follow-up care is a key part of your treatment and safety. Be sure to make and go to all appointments, and call your doctor if you are having problems. It's also a good idea to know your test results and keep a list of the medicines you take.  How can you care for yourself at home?  Diet    Use less salt when you cook and eat. This helps lower your blood pressure. Taste food before salting. Add only a little salt when you think you need it. With time, your taste buds will adjust to less salt.     Eat fewer snack items, fast foods, canned soups, and other high-salt, high-fat, processed foods.     Read food labels and try to avoid saturated and trans fats. They increase your risk of heart disease by raising cholesterol levels.     Limit the amount of solid fat--butter, margarine, and shortening--you eat. Use olive, peanut, or canola oil when you cook. Bake, broil, and steam foods instead of frying them.     Eat a variety of fruit and vegetables every day. Dark green, deep orange, red, or yellow fruits and vegetables are especially good for you. Examples include spinach, carrots, peaches, and berries.     Foods high in fiber can reduce your cholesterol and provide important vitamins and minerals. High-fiber foods include whole-grain cereals and breads, oatmeal, beans, brown rice, citrus fruits, and apples.     Eat lean proteins. Heart-healthy proteins include seafood, lean meats and poultry,  eggs, beans, peas, nuts, seeds, and soy products.     Limit drinks and foods with added sugar. These include candy, desserts, and soda pop.   Heart-healthy lifestyle    If your doctor recommends it, get more exercise. For many people, walking is a good choice. Or you may want to swim, bike, or do other activities. Bit by bit, increase the time you're active every day. Try for at least 30 minutes on most days of the week.     Try to quit or cut back on using tobacco and other nicotine products. This includes smoking and vaping. If you need help quitting,  talk to your doctor about stop-smoking programs and medicines. These can increase your chances of quitting for good. Quitting is one of the most important things you can do to protect your heart. It is never too late to quit. Try to avoid secondhand smoke too.     Stay at a weight that's healthy for you. Talk to your doctor if you need help losing weight.     Try to get 7 to 9 hours of sleep each night.     Limit alcohol to 2 drinks a day for men and 1 drink a day for women. Too much alcohol can cause health problems.     Manage other health problems such as diabetes, high blood pressure, and high cholesterol. If you think you may have a problem with alcohol or drug use, talk to your doctor.   Medicines    Take your medicines exactly as prescribed. Call your doctor if you think you are having a problem with your medicine.     If your doctor recommends aspirin , take the amount directed each day. Make sure you take aspirin  and not another kind of pain reliever, such as acetaminophen  (Tylenol ).   When should you call for help?   Call 911 if you have symptoms of a heart attack. These may include:    Chest pain or pressure, or a strange feeling in the chest.     Sweating.     Shortness of breath.     Pain, pressure, or a strange feeling in the back, neck, jaw, or upper belly or in one or both shoulders or arms.     Lightheadedness or sudden weakness.     A fast or  irregular heartbeat.   After you call 911, the operator may tell you to chew 1 adult-strength or 2 to 4 low-dose aspirin . Wait for an ambulance. Do not try to drive yourself.  Watch closely for changes in your health, and be sure to contact your doctor if you have any problems.  Where can you learn more?  Go to [employer] and enter F075 to learn more about A Healthy Heart: Care Instructions.  Current as of: July 25, 2023  Content Version: 14.5   [address] [place].   Care instructions adapted under license by [facility]. If you have questions about a medical condition or this instruction, always ask your healthcare professional. Romayne Alderman, [facility], disclaims any warranty or liability for your use of this information.    Personalized Preventive Plan for Jeffrey [name] - 08/18/2024  Medicare offers a range of preventive health benefits. Some of the tests and screenings are paid in full while other may be subject to a deductible, co-insurance, and/or copay.  Some of these benefits include a comprehensive review of your medical history including lifestyle, illnesses that may run in your family, and various assessments and screenings as appropriate.  After reviewing your medical record and screening and assessments performed today your provider may have ordered immunizations, labs, imaging, and/or referrals for you.  A list of these orders (if applicable) as well as your Preventive Care list are included within your After Visit Summary for your review.

## 2024-08-18 NOTE — Progress Notes (Signed)
 Medicare Annual Wellness Visit    Jeffrey [name] is here for Medicare AWV and Follow-up (No current issues)    Assessment & Plan   Medicare annual wellness visit, subsequent  Chronic diastolic congestive heart failure (HCC)  -     furosemide  (LASIX ) 40 MG tablet; Take 1 tablet by mouth 2 times daily, Disp-180 tablet, R-1Normal  -     metoprolol  succinate (TOPROL  XL) 25 MG extended release tablet; Take 1 tablet by mouth daily, Disp-90 tablet, R-0Normal  -     metOLazone  (ZAROXOLYN ) 2.5 MG tablet; Take 1 tablet by mouth Twice a Week, Disp-25 tablet, R-1**Patient requests 90 days supply**Normal  Primary hypertension  Severe obesity (BMI 35.0-39.9) with comorbidity (HCC)       Return in 6 weeks (on 09/29/2024) for Medicare Annual Wellness Visit in 1 year.     Subjective   Chief Complaint   Patient presents with    Medicare AWV    Follow-up     No current issues       HPI:  Patient is here for follow-up.  Patient feels much better was taking Zaroxolyn  2.5 mg tablet twice a week for 2 weeks then increased it to 3 times a week for 2 weeks felt much better leg swelling completely subsided he is able to ambulate and climb steps 1 flight no orthopnea, he reduced Zaroxolyn  to 2.5 mg tablet once a week at the present time feels well he lost 10 pounds  2D echo showed LVEF 50 to 55%.    Past Medical History, Surgical History, and Family History has been reviewed and updated.    Review of Systems:  Constitutional:  No fever, no fatigue, no chills, no headaches, no weight change  Dermatology:  No rash, no mole, no dry or sensitive skin  ENT:  No cough, no sore throat, no sinus pain, no runny nose, no ear pain  Cardiology:  No chest pain, no palpitations, no leg edema, no shortness of breath, no PND  Gastroenterology:  No dysphagia, no abdominal pain, no nausea, no vomiting, no constipation, no diarrhea, no heartburn  Musculoskeletal:  No joint pain, no leg cramps, no back pain, no muscle aches  Respiratory:  No shortness of breath,  no orthopnea, no wheezing, no DOE, no hemoptysis  Urology:  No blood in the urine, no urinary frequency, no urinary incontinence, no urinary urgency, no nocturia, no dysuria    Vitals:    08/18/24 1034   BP: 120/82   Pulse: 68   Temp: 98.3 F (36.8 C)   TempSrc: Temporal   SpO2: 94%   Weight: 103.5 kg (228 lb 1.6 oz)   Height: 1.727 m (5' 7.99)       General:  Patient alert and oriented x 3, NAD, pleasant  HEENT:  Atraumatic, normocephalic, PERRLA, EOMI, clear conjunctiva, TMs clear, nose-clear, throat - no erythema  Neck:  Supple, no goiter, no carotid bruits, no LAD  Lungs:  CTA   Heart:  RRR, no murmurs, gallops or rubs  Abdomen:  Soft/nt/nd, + bowel sounds  Lymph node examination: unremarkable  Neurological exam : unremarkable  Extremities:  No clubbing, cyanosis or edema  Skin: unremarkable    Hemoglobin A1C   Date Value Ref Range Status   09/05/2019 5.3 4.0 - 5.6 % Final     Cholesterol, Total   Date Value Ref Range Status   05/24/2023 126 <200 mg/dL Final     Triglycerides   Date Value Ref Range Status  05/24/2023 51 <150 mg/dL Final     HDL   Date Value Ref Range Status   05/24/2023 46 >40 mg/dL Final     VLDL   Date Value Ref Range Status   05/24/2023 10 mg/dL Final     Comment:     No normal range established.     Sodium   Date Value Ref Range Status   07/03/2024 138 136 - 145 mmol/L Final     Potassium   Date Value Ref Range Status   07/03/2024 4.2 3.5 - 5.1 mmol/L Final     Chloride   Date Value Ref Range Status   07/03/2024 103 98 - 107 mmol/L Final     CO2   Date Value Ref Range Status   07/03/2024 25 22 - 29 mmol/L Final     BUN   Date Value Ref Range Status   07/03/2024 15 8 - 23 mg/dL Final     Creatinine   Date Value Ref Range Status   07/03/2024 1.1 0.7 - 1.2 mg/dL Final     Glucose   Date Value Ref Range Status   07/03/2024 100 (H) 74 - 99 mg/dL Final     Calcium    Date Value Ref Range Status   07/03/2024 8.6 (L) 8.8 - 10.2 mg/dL Final     Total Bilirubin   Date Value Ref Range Status    07/03/2024 0.4 0.0 - 1.2 mg/dL Final     Alkaline Phosphatase   Date Value Ref Range Status   07/03/2024 67 40 - 129 U/L Final     AST   Date Value Ref Range Status   07/03/2024 18 0 - 50 U/L Final     ALT   Date Value Ref Range Status   07/03/2024 16 0 - 50 U/L Final     Est, Glom Filt Rate   Date Value Ref Range Status   07/03/2024 67 >60 mL/min/1.23m2 Final     Comment:           These results are not intended for use in patients <31 years of age.        eGFR results are calculated without a race factor using the 2021 CKD-EPI equation.  Careful clinical correlation is recommended, particularly when comparing to results   calculated using previous equations.  The CKD-EPI equation is less accurate in patients with extremes of muscle mass, extra-renal   metabolism of creatine, excessive creatine ingestion, or following therapy that affects   renal tubular secretion.       GFR African American   Date Value Ref Range Status   10/04/2021 >60  Final        Echo (TTE) complete (PRN contrast/bubble/strain/3D)  Result Date: 08/01/2024    Left Ventricle: Normal left ventricular systolic function with a visually estimated EF of 50 - 55%. Left ventricle size is normal. Moderate septal thickening. Grade I diastolic dysfunction with normal LAP.   Right Ventricle: Right ventricle size is normal. Normal systolic function.   Aortic Valve: Not well visualized. Mild sclerosis of the aortic valve cusps.   Mitral Valve: There is mild annular calcification noted.   Tricuspid Valve: Mild regurgitation.   Left Atrium: Left atrium is mildly dilated. Device closure in the appendage with a Watchman.   Image quality is suboptimal. Contrast used: Lumason .        No colonoscopy on file   No cologuard on file  No FIT/FOBT on file   No flexible sigmoidoscopy on  file   No breast cancer screening on file  No cervical cancer screening on file      Assessment/Plan:    Congestive heart failure controlled  Essential hypertension controlled  Obesity,  patient is under the care of bariatric center but he is not compliant on her diet      Outpatient Encounter Medications as of 08/18/2024   Medication Sig Dispense Refill    omeprazole  (PRILOSEC) 20 MG delayed release capsule Take 1 capsule by mouth daily 90 capsule 0    furosemide  (LASIX ) 40 MG tablet Take 1 tablet by mouth 2 times daily 180 tablet 1    metoprolol  succinate (TOPROL  XL) 25 MG extended release tablet Take 1 tablet by mouth daily 90 tablet 0    metOLazone  (ZAROXOLYN ) 2.5 MG tablet Take 1 tablet by mouth Twice a Week 25 tablet 1    ezetimibe (ZETIA) 10 MG tablet Take 1 tablet by mouth daily      atorvastatin  (LIPITOR ) 40 MG tablet Take 1 tablet by mouth daily      losartan  (COZAAR ) 25 MG tablet Take 1 tablet by mouth daily 90 tablet 0    potassium chloride  (KLOR-CON  M) 10 MEQ extended release tablet Take 1 tablet by mouth daily 90 tablet 0    tadalafil  (CIALIS ) 20 MG tablet Take 1 tablet by mouth as needed for Erectile Dysfunction 15 tablet 0    [DISCONTINUED] metOLazone  (ZAROXOLYN ) 2.5 MG tablet TAKE 1 TABLET BY MOUTH 2 TIMES A WEEK 25 tablet 2    [DISCONTINUED] furosemide  (LASIX ) 40 MG tablet Take 1 tablet by mouth daily (Patient taking differently: Take 1 tablet by mouth 2 times daily) 90 tablet 1    [DISCONTINUED] metoprolol  succinate (TOPROL  XL) 25 MG extended release tablet Take 1 tablet by mouth daily 90 tablet 1    [DISCONTINUED] omeprazole  (PRILOSEC) 20 MG delayed release capsule Take 1 capsule by mouth daily 90 capsule 0     No facility-administered encounter medications on file as of 08/18/2024.        Neyland was seen today for medicare awv and follow-up.    Diagnoses and all orders for this visit:    Medicare annual wellness visit, subsequent    Chronic diastolic congestive heart failure (HCC)  -     furosemide  (LASIX ) 40 MG tablet; Take 1 tablet by mouth 2 times daily  -     metoprolol  succinate (TOPROL  XL) 25 MG extended release tablet; Take 1 tablet by mouth daily  -     metOLazone   (ZAROXOLYN ) 2.5 MG tablet; Take 1 tablet by mouth Twice a Week    Primary hypertension    Severe obesity (BMI 35.0-39.9) with comorbidity (HCC)    Other orders  -     omeprazole  (PRILOSEC) 20 MG delayed release capsule; Take 1 capsule by mouth daily         Patient Instructions        Learning About Being Active as an Older Adult  Why is being active important as you get older?     Being active is one of the best things you can do for your health. And it's never too late to start. Being active--or getting active, if you aren't already--has definite benefits. It can:  Give you more energy,  Keep your mind sharp.  Improve balance to reduce your risk of falls.  Help you manage chronic illness with fewer medicines.  No matter how old you are, how fit you are, or what health problems  you have, there is a form of activity that will work for you. And the more physical activity you can do, the better your overall health will be.  What kinds of activity can help you stay healthy?  Being more active will make your daily activities easier. Physical activity includes planned exercise and things you do in daily life. There are four types of activity:  Aerobic.  Doing aerobic activity makes your heart and lungs strong.  Includes walking, dancing, and gardening.  Aim for at least 2 hours spread throughout the week.  It improves your energy and can help you sleep better.  Muscle-strengthening.  This type of activity can help maintain muscle and strengthen bones.  Includes climbing stairs, using resistance bands, and lifting or carrying heavy loads.  Aim for at least twice a week.  It can help protect the knees and other joints.  Stretching.  Stretching gives you better range of motion in joints and muscles.  Includes upper arm stretches, calf stretches, and gentle yoga.  Aim for at least twice a week, preferably after your muscles are warmed up from other activities.  It can help you function better in daily  life.  Balancing.  This helps you stay coordinated and have good posture.  Includes heel-to-toe walking, tai chi, and certain types of yoga.  Aim for at least 3 days a week.  It can reduce your risk of falling.  Even if you have a hard time meeting the recommendations, it's better to be more active than less active. All activity done in each category counts toward your weekly total. You'd be surprised how daily things like carrying groceries, keeping up with grandchildren, and taking the stairs can add up.  What keeps you from being active?  If you've had a hard time being more active, you're not alone. Maybe you remember being able to do more. Or maybe you've never thought of yourself as being active. It's frustrating when you can't do the things you want. Being more active can help. What's holding you back?  Getting started.  Have a goal, but break it into easy tasks. Small steps build into big accomplishments.  Staying motivated.  If you feel like skipping your activity, remember your goal. Maybe you want to move better and stay independent. Every activity gets you one step closer.  Not feeling your best.  Start with 5 minutes of an activity you enjoy. Prove to yourself you can do it. As you get comfortable, increase your time.  You may not be where you want to be. But you're in the process of getting there. Everyone starts somewhere.  How can you find safe ways to stay active?  Talk with your doctor about any physical challenges you're facing. Make a plan with your doctor if you have a health problem or aren't sure how to get started with activity.  If you're already active, ask your doctor if there is anything you should change to stay safe as your body and health change.  If you tend to feel dizzy after you take medicine, avoid activity at that time. Try being active before you take your medicine. This will reduce your risk of falls.  If you plan to be active at home, make sure to clear your space before you  get started. Remove things like TV cords, coffee tables, and throw rugs. It's safest to have plenty of space to move freely.  The key to getting more active is to take it slow  and steady. Try to improve only a little bit at a time. Pick just one area to improve on at first. And if an activity hurts, stop and talk to your doctor.  Where can you learn more?  Go to [employer] and enter P600 to learn more about Learning About Being Active as an Older Adult.  Current as of: July 25, 2023  Content Version: 14.5   [address], [place].   Care instructions adapted under license by [facility]. If you have questions about a medical condition or this instruction, always ask your healthcare professional. Romayne Alderman, [facility], disclaims any warranty or liability for your use of this information.         Hearing Loss: Care Instructions  Overview     Hearing loss is a sudden or slow decrease in how well you hear. It can range from slight to profound. Permanent hearing loss can occur with aging. It also can happen when you are exposed long-term to loud noise. Examples include listening to loud music, riding motorcycles, or being around other loud machines.  Hearing loss can affect your work and home life. It can make you feel lonely or depressed. You may feel that you have lost your independence. But hearing aids and other devices can help you hear better and feel connected to others.  Follow-up care is a key part of your treatment and safety. Be sure to make and go to all appointments, and call your doctor if you are having problems. It's also a good idea to know your test results and keep a list of the medicines you take.  How can you care for yourself at home?  Avoid loud noises whenever possible. This helps keep your hearing from getting worse.  Always wear hearing protection around loud noises.  Wear a hearing aid as directed.  A professional can help you pick a hearing aid that  will work best for you.  You can also get hearing aids over the counter for mild to moderate hearing loss.  Have hearing tests as your doctor suggests. They can show whether your hearing has changed. Your hearing aid may need to be adjusted.  Use other devices as needed. These may include:  Telephone amplifiers and hearing aids that can connect to a television, stereo, radio, or microphone.  Devices that use lights or vibrations. These alert you to the doorbell, a ringing telephone, or a baby monitor.  Television closed-captioning. This shows the words at the bottom of the screen. Most new TVs can do this.  TTY (text telephone). This lets you type messages back and forth on the telephone instead of talking or listening. These devices are also called TDD. When messages are typed on the keyboard, they are sent over the phone line to a receiving TTY. The message is shown on a monitor.  Use text messaging, social media, and email if it is hard for you to communicate by telephone.  Try to learn a listening technique called speechreading. It is not lipreading. You pay attention to people's gestures, expressions, posture, and tone of voice. These clues can help you understand what a person is saying. Face the person you are talking to, and have them face you. Make sure the lighting is good. You need to see the other person's face clearly.  Think about counseling if you need help to adjust to your hearing loss.  When should you call for help?  Watch closely for changes in your health, and be sure  to contact your doctor if:    You think your hearing is getting worse.     You have new symptoms, such as dizziness or nausea.   Where can you learn more?  Go to [employer] and enter R798 to learn more about Hearing Loss: Care Instructions.  Current as of: October 21, 2023  Content Version: 14.5   [address] [place].   Care instructions adapted under license by [facility]. If you have  questions about a medical condition or this instruction, always ask your healthcare professional. Romayne Alderman, [facility], disclaims any warranty or liability for your use of this information.         Learning About Vision Tests  What are vision tests?     The four most common vision tests are visual acuity tests, refraction, visual field tests, and color vision tests.  Visual acuity (sharpness) tests  These tests are used:  To see if you need glasses or contact lenses.  To monitor an eye problem.  To check an eye injury.  Visual acuity tests are done as part of routine exams. You may also have this test when you get your driver's license or apply for some types of jobs.  Visual field tests  These tests are used:  To check for vision loss in any area of your range of vision.  To screen for certain eye diseases.  To look for nerve damage after a stroke, head injury, or other problem that could reduce blood flow to the brain.  Refraction and color tests  A refraction test is done to find the right prescription for glasses and contact lenses.  A color vision test is done to check for color blindness.  Color vision is often tested as part of a routine exam. You may also have this test when you apply for a job where recognizing different colors is important, such as truck driving, Optician, dispensing, or the [employer].  How are vision tests done?  Visual acuity test   You cover one eye at a time.  You read aloud from a wall chart across the room.  You read aloud from a small card that you hold in your hand.  Refraction   You look into a special device.  The device puts lenses of different strengths in front of each eye to see how strong your glasses or contact lenses need to be.  Visual field tests   Your doctor may have you look through special machines.  Or your doctor may simply have you stare straight ahead while they move a finger into and out of your field of vision.  Color vision test   You look at pieces of printed test  patterns in various colors. You say what number or symbol you see.  Your doctor may have you trace the number or symbol using a pointer.  How do these tests feel?  There is very little chance of having a problem from this test. If dilating drops are used for a vision test, they may make the eyes sting and cause a medicine taste in the mouth.  Follow-up care is a key part of your treatment and safety. Be sure to make and go to all appointments, and call your doctor if you are having problems. It's also a good idea to know your test results and keep a list of the medicines you take.  Where can you learn more?  Go to [employer] and enter G551 to learn more about Learning About  Vision Tests.  Current as of: July 25, 2023  Content Version: 14.5   [address] [place].   Care instructions adapted under license by [facility]. If you have questions about a medical condition or this instruction, always ask your healthcare professional. Romayne Alderman, [facility], disclaims any warranty or liability for your use of this information.         Starting a Weight-Loss Plan: Care Instructions  Overview    It can be a challenge to lose weight. But your doctor can help you make a weight-loss plan that meets your needs.  You don't have to make a lot of big changes at once. A better idea might be to focus on small changes and stick with them. When those changes become habit, you can add a few more changes.  Some people find it helpful to take an exercise or nutrition class. If you have questions, ask your doctor about seeing a registered dietitian or an exercise specialist. You might also think about joining a weight-loss support group.  If you're not ready to make changes right now, try to pick a date in the future. Then make an appointment with your doctor to talk about when and how you'll get started with a plan.  Follow-up care is a key part of your treatment and safety. Be sure to make and go  to all appointments, and call your doctor if you are having problems. It's also a good idea to know your test results and keep a list of the medicines you take.  How can you care for yourself as you start a weight-loss plan?   Set realistic goals. Many people expect to lose much more weight than is likely. A weight loss of 5% to 10% of your body weight may be enough to improve your health.  Get family and friends involved to provide support. Talk to them about why you are trying to lose weight, and ask them to help. They can help by participating in exercise and having meals with you, even if they may be eating something different.  Find what works best for you. If you do not have time or do not like to cook, a program that offers meal replacement bars or shakes may be better for you. Or if you like to prepare meals, finding a plan that includes daily menus and recipes may be best.  Ask your doctor about other health professionals who can help you achieve your weight-loss goals.  A dietitian can help you make healthy changes in your diet.  An exercise specialist or personal trainer can help you develop a safe and effective exercise program.  A counselor or psychiatrist can help you cope with issues such as depression, anxiety, or family problems that can make it hard to focus on weight loss.  Consider joining a support group for people who are trying to lose weight. Your doctor can suggest groups in your area.  Where can you learn more?  Go to [employer] and enter U357 to learn more about Starting a Weight-Loss Plan: Care Instructions.  Current as of: April 24, 2023  Content Version: 14.5   [address], [place].   Care instructions adapted under license by [facility]. If you have questions about a medical condition or this instruction, always ask your healthcare professional. Romayne Alderman, [facility], disclaims any warranty or liability for your use of this information.          A Healthy Heart: Care Instructions  Overview  Coronary artery disease, also called heart disease, occurs when a substance called plaque builds up in the vessels that supply oxygen-rich blood to your heart muscle. This can narrow the blood vessels and reduce blood flow. A heart attack happens when blood flow is completely blocked. A high-fat diet, smoking, and other factors increase the risk of heart disease.  Your doctor has found that you have a chance of having heart disease. A heart-healthy lifestyle can help keep your heart healthy and prevent heart disease. This lifestyle includes eating healthy, being active, staying at a weight that's healthy for you, and not smoking or using tobacco. It also includes taking medicines as directed, managing other health conditions, and trying to get a healthy amount of sleep.  Follow-up care is a key part of your treatment and safety. Be sure to make and go to all appointments, and call your doctor if you are having problems. It's also a good idea to know your test results and keep a list of the medicines you take.  How can you care for yourself at home?  Diet    Use less salt when you cook and eat. This helps lower your blood pressure. Taste food before salting. Add only a little salt when you think you need it. With time, your taste buds will adjust to less salt.     Eat fewer snack items, fast foods, canned soups, and other high-salt, high-fat, processed foods.     Read food labels and try to avoid saturated and trans fats. They increase your risk of heart disease by raising cholesterol levels.     Limit the amount of solid fat--butter, margarine, and shortening--you eat. Use olive, peanut, or canola oil when you cook. Bake, broil, and steam foods instead of frying them.     Eat a variety of fruit and vegetables every day. Dark green, deep orange, red, or yellow fruits and vegetables are especially good for you. Examples include spinach, carrots, peaches, and berries.      Foods high in fiber can reduce your cholesterol and provide important vitamins and minerals. High-fiber foods include whole-grain cereals and breads, oatmeal, beans, brown rice, citrus fruits, and apples.     Eat lean proteins. Heart-healthy proteins include seafood, lean meats and poultry, eggs, beans, peas, nuts, seeds, and soy products.     Limit drinks and foods with added sugar. These include candy, desserts, and soda pop.   Heart-healthy lifestyle    If your doctor recommends it, get more exercise. For many people, walking is a good choice. Or you may want to swim, bike, or do other activities. Bit by bit, increase the time you're active every day. Try for at least 30 minutes on most days of the week.     Try to quit or cut back on using tobacco and other nicotine products. This includes smoking and vaping. If you need help quitting, talk to your doctor about stop-smoking programs and medicines. These can increase your chances of quitting for good. Quitting is one of the most important things you can do to protect your heart. It is never too late to quit. Try to avoid secondhand smoke too.     Stay at a weight that's healthy for you. Talk to your doctor if you need help losing weight.     Try to get 7 to 9 hours of sleep each night.     Limit alcohol to 2 drinks a day for men and 1 drink a day for  women. Too much alcohol can cause health problems.     Manage other health problems such as diabetes, high blood pressure, and high cholesterol. If you think you may have a problem with alcohol or drug use, talk to your doctor.   Medicines    Take your medicines exactly as prescribed. Call your doctor if you think you are having a problem with your medicine.     If your doctor recommends aspirin , take the amount directed each day. Make sure you take aspirin  and not another kind of pain reliever, such as acetaminophen  (Tylenol ).   When should you call for help?   Call 911 if you have symptoms of a heart attack.  These may include:    Chest pain or pressure, or a strange feeling in the chest.     Sweating.     Shortness of breath.     Pain, pressure, or a strange feeling in the back, neck, jaw, or upper belly or in one or both shoulders or arms.     Lightheadedness or sudden weakness.     A fast or irregular heartbeat.   After you call 911, the operator may tell you to chew 1 adult-strength or 2 to 4 low-dose aspirin . Wait for an ambulance. Do not try to drive yourself.  Watch closely for changes in your health, and be sure to contact your doctor if you have any problems.  Where can you learn more?  Go to [employer] and enter F075 to learn more about [facility]: Care Instructions.  Current as of: July 25, 2023  Content Version: 14.5   [address] [place].   Care instructions adapted under license by [facility]. If you have questions about a medical condition or this instruction, always ask your healthcare professional. Romayne Alderman, [facility], disclaims any warranty or liability for your use of this information.    Personalized Preventive Plan for Jeffrey [name] - 08/18/2024  Medicare offers a range of preventive health benefits. Some of the tests and screenings are paid in full while other may be subject to a deductible, co-insurance, and/or copay.  Some of these benefits include a comprehensive review of your medical history including lifestyle, illnesses that may run in your family, and various assessments and screenings as appropriate.  After reviewing your medical record and screening and assessments performed today your provider may have ordered immunizations, labs, imaging, and/or referrals for you.  A list of these orders (if applicable) as well as your Preventive Care list are included within your After Visit Summary for your review.          On this date 08/18/2024 I have spent 25 minutes reviewing previous notes, test results and face to face with the patient  discussing the diagnosis and importance of compliance with the treatment plan as well as documenting on the day of the visit.      Mara GORMAN Naval, MD   08/18/24     Patient's complete Health Risk Assessment and screening values have been reviewed and are found in Flowsheets. The following problems were reviewed today and where indicated follow up appointments were made and/or referrals ordered.    Positive Risk Factor Screenings with Interventions:              Inactivity:  On average, how many days per week do you engage in moderate to strenuous exercise (like a brisk walk)?: 1 day (!) Abnormal  On average, how many minutes do you engage in  exercise at this level?: 30 min  Interventions:  Recommendations: patient agrees to exercise for at least 150 minutes/week     Abnormal BMI (obese):  Body mass index is 34.69 kg/m. 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                  Allergies   Allergen Reactions    Lidocaine  Other (See Comments)     Light-headed and passed out     Lidocaine -Epinephrine (Pf) Other (See Comments)     Light-headed and passed out     Cephalexin Hives and Rash    Sulfa Antibiotics Rash     Prior to Visit Medications   Medication Sig Taking? Authorizing  Provider   omeprazole  (PRILOSEC) 20 MG delayed release capsule Take 1 capsule by mouth daily Yes Deya Bigos S, MD   furosemide  (LASIX ) 40 MG tablet Take 1 tablet by mouth 2 times daily Yes Westlee Devita S, MD   metoprolol  succinate (TOPROL  XL) 25 MG extended release tablet Take 1 tablet by mouth daily Yes Shemicka Cohrs S, MD   metOLazone  (ZAROXOLYN ) 2.5 MG tablet Take 1 tablet by mouth Twice a Week Yes Alvita Fana S, MD   ezetimibe (ZETIA) 10 MG tablet Take 1 tablet by mouth daily Yes [provider]   atorvastatin  (LIPITOR ) 40 MG tablet Take 1 tablet by mouth daily Yes [provider]   losartan  (COZAAR ) 25 MG tablet Take 1 tablet by mouth daily Yes Aaryn Parrilla S, MD   potassium chloride  (KLOR-CON  M) 10 MEQ extended release tablet Take 1 tablet by mouth daily Yes Ryon Layton S, MD   tadalafil  (CIALIS ) 20 MG tablet Take 1 tablet by mouth as needed for Erectile Dysfunction Yes Manasi Dishon S, MD       CareTeam (Including outside providers/suppliers regularly involved in providing care):   Patient Care Team:  Odile Veloso, Mara RAMAN, MD as PCP - General (Internal Medicine)  Refugia Laneve, Mara RAMAN, MD as PCP - Empaneled Provider  Lanie Metro, MD as Consulting Physician (Cardiology)     Recommendations for Preventive Services Due: see orders and patient instructions/AVS.  Recommended screening schedule for the next 5-10 years is provided to the patient in written form: see Patient Instructions/AVS.     Reviewed and updated this visit:  Tobacco  Allergies  Meds  Problems  Med Hx  Surg Hx  Fam Hx

## 2024-08-21 ENCOUNTER — Encounter

## 2024-08-21 ENCOUNTER — Ambulatory Visit: Payer: Medicare (Managed Care) | Attending: Adult Health | Primary: Internal Medicine

## 2024-08-22 ENCOUNTER — Ambulatory Visit: Admit: 2024-08-22 | Payer: Medicare (Managed Care) | Primary: Internal Medicine

## 2024-08-22 ENCOUNTER — Ambulatory Visit
Admit: 2024-08-22 | Discharge: 2024-08-22 | Payer: Medicare (Managed Care) | Attending: Orthopaedic Surgery | Primary: Internal Medicine

## 2024-08-22 VITALS — Ht 68.0 in | Wt 224.0 lb

## 2024-08-22 DIAGNOSIS — M25511 Pain in right shoulder: Principal | ICD-10-CM

## 2024-08-22 NOTE — Progress Notes (Signed)
 Jeffrey [name] is a 78 y.o. male, who presents   Chief Complaint   Patient presents with    Shoulder Pain     Right Shoulder x couple months, no known recent injury, no previous right shoulder or neck surgery.         HPI:: Jeffrey [name] was seen for pain in his right shoulder.  He said this for about 2 months.  He had an incident where he was getting out of bed and he experienced a pop in his right shoulder and had pain afterwards.  He has pain reaching forward mostly.  He has occasional discomfort going backwards but changes in position also cause him problems.  He is had no treatment or evaluation for this.  There is been no swelling or crepitus but he does have weakness.    Allergies; medications; past medical, surgical, family, and social history; and problem list have been reviewed today and updated as indicated in this encounter - see below following Ortho specifics.    Musculoskeletal: Skin condition and gross neurovascular functions good in the right upper extremity.  Elbow wrist and hand motion are all full with no instability or pain.  The shoulders are somewhat protracted which is related to his body build.  There is no instability of AC or glenohumeral joints.  Active range of motion is somewhat limited and guarded.  Passive range of motion is limited in retraction and also with rotation.  There is also discomfort changing positions towards a forward elevated position.    Radiologic Studies: Imaging studies show no bony abnormalities with good alignment of the [facility] joint and good alignment of the glenohumeral joint.  There are no projecting osteophytes.    ASSESSMENT:  Jeffrey [name] was seen today for shoulder pain.    Diagnoses and all orders for this visit:    Acute pain of right shoulder     Treatment alternatives were reviewed including medical and physical therapies, injections, and surgical options, expected risks benefits and likely outcome of each were discussed in detail, questions asked and answered  and understood.  We discussed the symptom as well as physical findings and imaging results.  Physical exam suggest rotator cuff injury.  To better evaluate the soft tissues would require MRI scanning.    PLAN: Will scan right shoulder MRI and review films with Mr. Headley when available.        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)    Carpal tunnel syndrome, left    Acute on chronic combined systolic and diastolic congestive heart failure (HCC)    Chronic obstructive pulmonary disease, unspecified    Cervical paraspinal muscle spasm    Cervical spondylosis       Past Medical History:   Diagnosis Date    Atrial fibrillation (HCC)     Has a Watchman device    GERD (gastroesophageal reflux disease)     Hyperlipidemia     Hypertension     Sleep apnea     uses the mouth piece       Past Surgical History:   Procedure Laterality Date    APPENDECTOMY  CARDIAC SURGERY      watchman implant    COLONOSCOPY      HERNIA REPAIR         Current Outpatient Medications   Medication Sig Dispense Refill    omeprazole  (PRILOSEC) 20 MG delayed release capsule Take 1 capsule by mouth daily 90 capsule 0    furosemide  (LASIX ) 40 MG tablet Take 1 tablet by mouth 2 times daily 180 tablet 1    metoprolol  succinate (TOPROL  XL) 25 MG extended release tablet Take 1 tablet by mouth daily 90 tablet 0    metOLazone  (ZAROXOLYN ) 2.5 MG tablet Take 1 tablet by mouth Twice a Week 25 tablet 1    ezetimibe (ZETIA) 10 MG tablet Take 1 tablet by mouth daily      atorvastatin  (LIPITOR ) 40 MG tablet Take 1 tablet by mouth daily      losartan  (COZAAR ) 25 MG tablet Take 1 tablet by mouth daily 90 tablet 0     potassium chloride  (KLOR-CON  M) 10 MEQ extended release tablet Take 1 tablet by mouth daily 90 tablet 0    tadalafil  (CIALIS ) 20 MG tablet Take 1 tablet by mouth as needed for Erectile Dysfunction 15 tablet 0     No current facility-administered medications for this visit.       Allergies   Allergen Reactions    Lidocaine  Other (See Comments)     Light-headed and passed out     Lidocaine -Epinephrine (Pf) Other (See Comments)     Light-headed and passed out     Cephalexin Hives and Rash    Sulfa Antibiotics Rash       Social History     Socioeconomic History    Marital status: Married     Spouse name: None    Number of children: 1    Years of education: None    Highest education level: None   Tobacco Use    Smoking status: Never    Smokeless tobacco: Never   Vaping Use    Vaping status: Never Used   Substance and Sexual Activity    Alcohol use: Yes     Alcohol/week: 0.0 - 1.0 standard drinks of alcohol     Comment: 2 drinks vodka a day     Drug use: Never    Sexual activity: Yes     Social Drivers of Psychologist, prison and probation services Strain: Low Risk  (07/04/2023)    Overall Financial Resource Strain (CARDIA)     Difficulty of Paying Living Expenses: Not very hard   Food Insecurity: No Food Insecurity (07/04/2024)    Hunger Vital Sign     Worried About Running Out of Food in the Last Year: Never true     Ran Out of Food in the Last Year: Never true   Transportation Needs: No Transportation Needs (07/04/2024)    PRAPARE - Therapist, art (Medical): No     Lack of Transportation (Non-Medical): No   Physical Activity: Insufficiently Active (08/18/2024)    Exercise Vital Sign     Days of Exercise per Week: 1 day     Minutes of Exercise per Session: 30 min   Housing Stability: Low Risk  (07/04/2024)    Housing Stability Vital Sign     Unable to Pay for Housing in the Last Year: No     Number of Times Moved in the Last Year: 1     Homeless in the Last Year: No  Family History   Problem Relation  Age of Onset    Heart Attack Father          Review of Systems:   As follows except as previously noted in HPI:  Constitutional: Negative for chills, diaphoresis,  fever   Respiratory: Negative for cough, shortness of breath and wheezing.    Cardiovascular: Negative for chest pain and palpitations.   Neurological: Negative for dizziness, syncope,   GI / GU: abdominal pain or cramping  Musculoskeletal: see HPI       Objective:   Physical Exam   Constitutional: Oriented to person, place, and time. and appears well-developed and well-nourished. :   Head: Normocephalic and atraumatic.   Neck: Neck supple.  Eyes: EOM are normal.   Pulmonary/Chest: Effort normal.  No respiratory distress, no wheezes.   Neurological: Alert and oriented to person  Skin: Skin is warm and dry.               MARLA Redell Pouch, DO    08/22/24  8:30 AM    All reasonable efforts have been made to minimize the risk of errors that may occur in the use of voice recognition and other electronic means of charting.

## 2024-08-22 NOTE — Addendum Note (Signed)
 Addended by: Madyx Delfin on: 08/22/2024 08:59 AM     Modules accepted: Orders

## 2024-09-02 ENCOUNTER — Inpatient Hospital Stay: Admit: 2024-09-02 | Payer: Medicare (Managed Care) | Attending: Orthopaedic Surgery | Primary: Internal Medicine

## 2024-09-02 DIAGNOSIS — M25511 Pain in right shoulder: Principal | ICD-10-CM

## 2024-09-04 ENCOUNTER — Ambulatory Visit
Admit: 2024-09-04 | Discharge: 2024-09-04 | Payer: Medicare (Managed Care) | Attending: Internal Medicine | Primary: Internal Medicine

## 2024-09-04 VITALS — BP 112/70 | HR 64 | Resp 16 | Ht 68.0 in | Wt 230.0 lb

## 2024-09-04 DIAGNOSIS — I509 Heart failure, unspecified: Principal | ICD-10-CM

## 2024-09-04 NOTE — Progress Notes (Unsigned)
 [facility] Progress Note  Dr. Jeryl Lie      Referring Physician: Bulah Mara RAMAN, MD  CHIEF COMPLAINT:   Chief Complaint   Patient presents with    Congestive Heart Failure    1 Year Follow Up       HISTORY OF PRESENT ILLNESS:   Patient is 78 years old male with history of CABG in November 2024, atrial fibrillation s/p ablation, bradycardia, Watchman device implant, hypertension is here for follow-up appointment  Patient denies any chest pain, no shortness of breath, no lightheadedness, no dizziness, no palpitations, no pedal edema, no PND, no orthopnea, no syncope, no presyncopal episodes.  Functional capacity is at baseline    Past Medical History:   Diagnosis Date    Atrial fibrillation (HCC)     Has a Watchman device    GERD (gastroesophageal reflux disease)     Hyperlipidemia     Hypertension     Sleep apnea     uses the mouth piece         Past Surgical History:   Procedure Laterality Date    APPENDECTOMY      CARDIAC SURGERY      watchman implant    COLONOSCOPY      HERNIA REPAIR           Current Outpatient Medications   Medication Sig Dispense Refill    omeprazole  (PRILOSEC) 20 MG delayed release capsule Take 1 capsule by mouth daily 90 capsule 0    furosemide  (LASIX ) 40 MG tablet Take 1 tablet by mouth 2 times daily 180 tablet 1    metoprolol  succinate (TOPROL  XL) 25 MG extended release tablet Take 1 tablet by mouth daily 90 tablet 0    metOLazone  (ZAROXOLYN ) 2.5 MG tablet Take 1 tablet by mouth Twice a Week 25 tablet 1    ezetimibe (ZETIA) 10 MG tablet Take 1 tablet by mouth daily      atorvastatin  (LIPITOR ) 40 MG tablet Take 1 tablet by mouth daily      losartan  (COZAAR ) 25 MG tablet Take 1 tablet by mouth daily 90 tablet 0    potassium chloride  (KLOR-CON  M) 10 MEQ extended release tablet Take 1 tablet by mouth daily 90 tablet 0    tadalafil  (CIALIS ) 20 MG tablet Take 1 tablet by mouth as needed for Erectile Dysfunction 15 tablet 0     No current facility-administered medications for this  visit.         Allergies as of 09/04/2024 - Fully Reviewed 09/04/2024   Allergen Reaction Noted    Lidocaine  Other (See Comments) 05/15/2023    Lidocaine -epinephrine (pf) Other (See Comments) 04/28/2021    Cephalexin Hives and Rash 04/28/2021    Sulfa antibiotics Rash 09/04/2019       Social History     Socioeconomic History    Marital status: Married     Spouse name: Not on file    Number of children: 1    Years of education: Not on file    Highest education level: Not on file   Occupational History    Not on file   Tobacco Use    Smoking status: Never    Smokeless tobacco: Never   Vaping Use    Vaping status: Never Used   Substance and Sexual Activity    Alcohol use: Yes     Alcohol/week: 0.0 - 1.0 standard drinks of alcohol     Comment: 2 drinks vodka a day     Drug  use: Never    Sexual activity: Yes   Other Topics Concern    Not on file   Social History Narrative    Not on file     Social Drivers of Health     Financial Resource Strain: Low Risk  (07/04/2023)    Overall Financial Resource Strain (CARDIA)     Difficulty of Paying Living Expenses: Not very hard   Food Insecurity: No Food Insecurity (07/04/2024)    Hunger Vital Sign     Worried About Running Out of Food in the Last Year: Never true     Ran Out of Food in the Last Year: Never true   Transportation Needs: No Transportation Needs (07/04/2024)    PRAPARE - Therapist, art (Medical): No     Lack of Transportation (Non-Medical): No   Physical Activity: Insufficiently Active (08/18/2024)    Exercise Vital Sign     Days of Exercise per Week: 1 day     Minutes of Exercise per Session: 30 min   Stress: Not on file   Social Connections: Not on file   Intimate Partner Violence: Not on file   Housing Stability: Low Risk  (07/04/2024)    Housing Stability Vital Sign     Unable to Pay for Housing in the Last Year: No     Number of Times Moved in the Last Year: 1     Homeless in the Last Year: No       Family History   Problem Relation Age of  Onset    Heart Attack Father        REVIEW OF SYSTEMS:   CONSTITUTIONAL:  negative for  fevers, chills, sweats and fatigue  HEENT:  negative for  tinnitus, earaches, nasal congestion and epistaxis  RESPIRATORY:  negative for  dry cough, cough with sputum,wheezing and hemoptysis  GASTROINTESTINAL:  negative for nausea, vomiting, diarrhea, constipation, pruritus and jaundice  HEMATOLOGIC/LYMPHATIC:  negative for easy bruising, bleeding, lymphadenopathy and petechiae  ENDOCRINE:  negative for heat intolerance, cold intolerance, tremor, hair loss and diabetic symptoms including neither polyuria nor polydipsia nor blurred vision  MUSCULOSKELETAL:  negative for  myalgias, arthralgias, joint swelling, stiff joints and decreased range of motion  NEUROLOGICAL:  negative for memory problems, speech problems, visual disturbance, dysphagia, weakness and numbness      PHYSICAL EXAM:   Constitutional:  Awake, alert cooperative, no apparent distress, and appears stated age.   HEENT:  Moist and pink mucous membranes, normocephalic, without obvious abnormality, atraumatic, normal ears and nose.   NECK:  Supple, symmetrical, trachea midline, no JVD, no adenopathy, thyroid symmetric, not enlarged and no tenderness, good carotid upstroke bilaterally, no carotid bruit, skin normal.   LUNGS: No increased work of breathing, good air exchange, clear to auscultation bilaterally, no crackles or wheezing.  Cardiovascular: Normal apical impulse, regular rate and rhythm, normal S1 and S2, no S3 or S4, 2/6 systolic murmur at the apex, 2/6 systolic murmur at the left lower sternal border, no pedal edema, good carotid upstroke bilaterally, no carotid bruit, no JVD, no abdominal pulsating masses.   ABDOMEN: Obese, Soft, nontender, no hepatomegaly, no splenomegaly, bowel sound positive.   CHEST:  Expands symmetrically, nontender to palpation.  Musculoskeletal:  No clubbing or cyanosis.  No redness, warmth, or swelling of the joints.  Neurological:  Alert, awake, and oriented X3.   SKIN: No bruises, no bleeding, normal skin color, texture, turgor and no redness, warmth or swelling.  BP 112/70   Pulse 64   Resp 16   Ht 1.727 m (5' 8)   Wt 104.3 kg (230 lb)   BMI 34.97 kg/m     DATA:   I personally reviewed the visit EKG with the following interpretation: Sinus rhythm, bifascicular block, nonspecific T wave changes, PVCs.    EKG 08/06/2023, sinus rhythm, bifascicular block, no significant changes when compared to previous    EKG 08/01/2022, sinus rhythm, right bundle branch block, normal axis    EKG 09/15/19  Sinus rhythm    ECHO: 11/15/2023, [facility], post CABGPost CABG: S/P on pump CABG.  Post LV: No significant change from pre-op. Post-op EF is estimated  at 60-65%. No RWMA.  Post LA: Left atrial appendage has been flush occluded and good flow  is seen in LUPV.  Post Aorta: There is no aortic dissection.  Post Pericardium: No pericardial effusion noted.  Post MV: No signficant change from pre-op. mild MR  Post TV: No significant change from pre-op.  Probe Removal: Probe removed without difficulty. No blood noted at the  tip. No complications noted     [facility] at time of Watchman, 06/09/2022)  Findings discussed with Dr. Clorinda. L.V.: The visually estimated EF is 60-65%. Left ventricular diastolic function is grade I dysfunction. RWMA: There are no regional wall motion abnormalities. R.V.: Normal right ventricle size and function. L.A.: The left atrium is normal. There is no thrombus identified in the left atrial appendage. R.A.: The right atrium is normal. I.A.S.: The interatrial septum appears normal. The interatrial septum appears intact (no ASD or shunt). I.V.S.: Interventricular septum morphology: moderately hypertrophied. Pericardium.: The pericardium is normal. Pleural.: There is no pleural effusion. Ao.: The descending aorta thickness suggests intimal thickening. A.V.: The aortic valve is trileaflet, structurally normal,  without evidence of stenosis or regurgitation. M.V.: The mitral valve is structurally normal, without evidence of stenosis with mild regurgitation. T.V.: The tricuspid valve is structurally normal with no evidence of regurgitation or stenosis. P.V.: The pulmonic valve is structured normally with no evidence of stenosis with trace regurgitation.    07/24/2021  Summary  No previous echo for comparison. Technically adequate study.  Left ventricle size is normal.  Mild concentric left ventricular hypertrophy.  Ejection fraction is visually estimated at 55%.  No regional wall motion abnormalities seen.  There is doppler evidence of stage II diastolic dysfunction.  Physiologic and/or trace tricuspid regurgitation.  RVSP is 37 mmHg.  Pulmonary hypertension is mild.    09/05/19 Summary   Technically difficult examination. Enhancing contrast was used to define   endocardial borders.   Normal left ventricular chamber size.   Normal left ventricular systolic function.   Visually estimated LVEF is 60-65 %.   No wall motion abnormalities.   Normal right ventricle structure and function.   No significant valvular abnormalities.   No comparison study available.    Stress Test: Cardiac Testing:  EKG - (06/22/2017) Sinus bradycardia.  Normal ECG.  Echocardiogram - (06/26/2017) Normal left ventricular systolic function with stage I diastolic dysfunction.  Trace mitral valve regurgitation.  Trace tricuspid valve regurgitation with RVSP of 30 mmHg.  Stress Test - (07/05/2017) Negative Lexiscan  stress test for ischemic symptoms or  ischemic EKG changes.  Normal Cardiolite  perfusion scan without evidence of fixed or reversible defect.  Normal left ventricular systolic function with normal wall motion.  There is no old comparison study.  The results of the study predict low probability for significant coronary artery  disease or future cardiac events.    Angiography:    Cardiology Labs: BMP:    Lab Results   Component Value Date/Time    NA 138  07/03/2024 08:38 AM    K 4.2 07/03/2024 08:38 AM    K 3.6 10/04/2021 09:17 PM    CL 103 07/03/2024 08:38 AM    CO2 25 07/03/2024 08:38 AM    BUN 15 07/03/2024 08:38 AM    CREATININE 1.1 07/03/2024 08:38 AM     CMP:    Lab Results   Component Value Date/Time    NA 138 07/03/2024 08:38 AM    K 4.2 07/03/2024 08:38 AM    K 3.6 10/04/2021 09:17 PM    CL 103 07/03/2024 08:38 AM    CO2 25 07/03/2024 08:38 AM    BUN 15 07/03/2024 08:38 AM    CREATININE 1.1 07/03/2024 08:38 AM     CBC:    Lab Results   Component Value Date/Time    WBC 9.7 07/03/2023 08:35 AM    RBC 4.59 07/03/2023 08:35 AM    HGB 13.4 07/03/2023 08:35 AM    HCT 39.8 07/03/2023 08:35 AM    MCV 86.7 07/03/2023 08:35 AM    RDW 15.0 07/03/2023 08:35 AM    PLT 251 07/03/2023 08:35 AM     PT/INR:  No results found for: PTINR  PT/INR Warfarin:  No components found for: PTPATWAR, PTINRWAR  PTT:  No results found for: APTT  PTT Heparin:  No components found for: APTTHEP  Magnesium:    Lab Results   Component Value Date/Time    MG 1.9 05/26/2023 06:50 AM     TSH:    Lab Results   Component Value Date/Time    TSH 0.77 05/24/2023 05:07 AM     TROPONIN:  No components found for: TROP  BNP:  No results found for: BNP  FASTING LIPID PANEL:    Lab Results   Component Value Date/Time    CHOL 126 05/24/2023 05:07 AM    HDL 46 05/24/2023 05:07 AM    TRIG 51 05/24/2023 05:07 AM     No orders to display     I have personally reviewed the laboratory, cardiac diagnostic and radiographic testing as outlined above:      IMPRESSION:  1: CAD: S/p CABG: Details are not known chronic diastolic congestive heart failure: Compensated, continue current treatment  2: Paroxysmal atrial fibrillation: S/p ablation, had Watchman procedure in June 2023, will continue current treatment  3: Hypertension: Controlled, continue current treatment.            4: Family history of early CAD: Father died of massive heart attack at age 81          RECOMMENDATIONS:   1.  Continue current  treatment  2.  Toprol -XL 12.5 mg by mouth daily  3.  Follow-up with Dr. Bulah as scheduled  4.  Follow-up with Dr. Lanie in 1 year (he goes to [place]  beginning of October till end of May, is established with cardiology practice there), sooner if symptomatic for any reason    I have reviewed my findings and recommendations with patient    Electronically signed by Jeryl Lanie, MD on 09/04/2024 at 11:57 AM    NOTE: This report was transcribed using voice recognition software. Every effort was made to ensure accuracy; however, inadvertent computerized transcription errors may be present

## 2024-09-17 ENCOUNTER — Ambulatory Visit
Admit: 2024-09-17 | Discharge: 2024-09-17 | Payer: Medicare (Managed Care) | Attending: Orthopaedic Surgery | Primary: Internal Medicine

## 2024-09-17 VITALS — Ht 67.99 in | Wt 230.0 lb

## 2024-09-17 DIAGNOSIS — R29898 Other symptoms and signs involving the musculoskeletal system: Principal | ICD-10-CM

## 2024-09-17 NOTE — Progress Notes (Signed)
 Chief Complaint:   Chief Complaint   Patient presents with    Follow-up     Patient presents today for MRI follow up of Right Shoulder. Patient states no improvement, pain is about the same. Prednisone  for pain with relief. No recent injury.        Jeffrey [name] follows up.  He has his MRI of the shoulder and is here to review the results and get recommendations for treatment.  He continues to have discomfort and weakness in the shoulder.      Allergies; medications; past medical, surgical, family, and social history; and problem list have been reviewed today and updated as indicated in this encounter seen below.    Exam: Range of motion of the right shoulder somewhat limited in elevation in any plane.  Rotation is limited and also uncomfortable.  There are some subacromial crepitus with rotation of the arm at the shoulder.  There is also weakness against resisted abduction and external rotation with the arm at side.    Radiographs: MRI shows abnormal signal in the supraspinatus and infraspinatus tendons.  There is thinning of the tendon and fluid interspersed.  There are some atrophy of the supraspinatus muscle in the supraspinatus fossa.    ASSESSMENT:    [name] was seen today for follow-up.    Diagnoses and all orders for this visit:    Weakness of right shoulder        PLAN: We discussed the findings on MRI and also his symptoms.  Mr. Jeffrey [name] is scheduled to return to [place]  for winter.  They are leaving in a couple of weeks.  We discussed the pathology on MRI and functional significance.  Also we discussed treatment options and at this point I would recommend at some point arthroscopic exam and treatment.  I do believe that the findings are little worse than reported on MRI.  This is also considering his physical exam.  He should be careful about activity while he is in [place] .  If the shoulder begins hurting too much he could seek out an orthopedic surgeon down there for evaluation and at least temporary  treatment.  No follow-ups on file.       Current Outpatient Medications   Medication Sig Dispense Refill    predniSONE  (DELTASONE ) 5 MG tablet Take 1 tablet by mouth daily      omeprazole  (PRILOSEC) 20 MG delayed release capsule Take 1 capsule by mouth daily 90 capsule 0    furosemide  (LASIX ) 40 MG tablet Take 1 tablet by mouth 2 times daily 180 tablet 1    metoprolol  succinate (TOPROL  XL) 25 MG extended release tablet Take 1 tablet by mouth daily 90 tablet 0    metOLazone  (ZAROXOLYN ) 2.5 MG tablet Take 1 tablet by mouth Twice a Week 25 tablet 1    ezetimibe (ZETIA) 10 MG tablet Take 1 tablet by mouth daily      atorvastatin  (LIPITOR ) 40 MG tablet Take 1 tablet by mouth daily      losartan  (COZAAR ) 25 MG tablet Take 1 tablet by mouth daily 90 tablet 0    potassium chloride  (KLOR-CON  M) 10 MEQ extended release tablet Take 1 tablet by mouth daily 90 tablet 0    tadalafil  (CIALIS ) 20 MG tablet Take 1 tablet by mouth as needed for Erectile Dysfunction 15 tablet 0     No current facility-administered medications for this visit.       Patient Active Problem List   Diagnosis  Primary hypertension    Diverticular disease    Gastroesophageal reflux disease    Acute on chronic congestive heart failure, unspecified heart failure type (HCC)    Ventricular bigeminy    PAF (paroxysmal atrial fibrillation) (HCC)    PAT (paroxysmal atrial tachycardia)    Chronic heart failure with preserved ejection fraction (HFpEF) (HCC)    Carpal tunnel syndrome, left    Acute on chronic combined systolic and diastolic congestive heart failure (HCC)    Chronic obstructive pulmonary disease, unspecified    Cervical paraspinal muscle spasm    Cervical spondylosis       Past Medical History:   Diagnosis Date    Atrial fibrillation (HCC)     Has a Watchman device    GERD (gastroesophageal reflux disease)     Hyperlipidemia     Hypertension     Sleep apnea     uses the mouth piece       Past Surgical History:   Procedure Laterality Date     APPENDECTOMY      CARDIAC SURGERY      watchman implant    COLONOSCOPY      HERNIA REPAIR         Allergies   Allergen Reactions    Lidocaine  Other (See Comments)     Light-headed and passed out     Lidocaine -Epinephrine (Pf) Other (See Comments)     Light-headed and passed out     Cephalexin Hives and Rash    Sulfa Antibiotics Rash       Social History     Socioeconomic History    Marital status: Married     Spouse name: None    Number of children: 1    Years of education: None    Highest education level: None   Tobacco Use    Smoking status: Never    Smokeless tobacco: Never   Vaping Use    Vaping status: Never Used   Substance and Sexual Activity    Alcohol use: Yes     Alcohol/week: 0.0 - 1.0 standard drinks of alcohol     Comment: 2 drinks vodka a day     Drug use: Never    Sexual activity: Yes     Social Drivers of Psychologist, prison and probation services Strain: Low Risk  (07/04/2023)    Overall Financial Resource Strain (CARDIA)     Difficulty of Paying Living Expenses: Not very hard   Food Insecurity: No Food Insecurity (07/04/2024)    Hunger Vital Sign     Worried About Running Out of Food in the Last Year: Never true     Ran Out of Food in the Last Year: Never true   Transportation Needs: No Transportation Needs (07/04/2024)    PRAPARE - Therapist, art (Medical): No     Lack of Transportation (Non-Medical): No   Physical Activity: Insufficiently Active (08/18/2024)    Exercise Vital Sign     Days of Exercise per Week: 1 day     Minutes of Exercise per Session: 30 min   Housing Stability: Low Risk  (07/04/2024)    Housing Stability Vital Sign     Unable to Pay for Housing in the Last Year: No     Number of Times Moved in the Last Year: 1     Homeless in the Last Year: No       Review of Systems  As follows except as previously  noted in HPI:  Constitutional: Negative for chills, diaphoresis, fatigue, fever and unexpected weight change.   Respiratory: Negative for cough, shortness of breath  and wheezing.    Cardiovascular: Negative for chest pain and palpitations.   Neurological: Negative for dizziness, syncope, cephalgia.  GI / GU: negative  Musculoskeletal: see HPI       Objective:   Physical Exam   Constitutional: Oriented to person, place, and time. and appears well-developed and well-nourished. :   Head: Normocephalic and atraumatic.   Eyes: EOM are normal.   Neck: Neck supple.   Cardiovascular: Normal rate and regular rhythm.    Pulmonary/Chest: Effort normal. No stridor. No respiratory distress, no wheezes.   Abdominal:  No abnormal distension.    Neurological: Alert and oriented to person, place, and time.   Skin: Skin is warm and dry.   Psychiatric: Normal mood and affect. Behavior is normal. Thought content normal.    Jiovany Scheffel Redell Pouch, DO    09/17/24  11:49 AM

## 2024-09-27 ENCOUNTER — Encounter

## 2024-09-29 NOTE — Telephone Encounter (Signed)
"  Name of Medication(s) Requested:  Requested Prescriptions     Pending Prescriptions Disp Refills    losartan  (COZAAR ) 25 MG tablet [Pharmacy Med Name: LOSARTAN  25MG  TABLETS] 90 tablet 1     Sig: TAKE 1 TABLET BY MOUTH DAILY       Medication is on current medication list Yes    Dosage and directions were verified? Yes    Quantity verified: 90 day supply     Pharmacy Verified?  Yes    Last Appointment:  08/18/2024    Future appts:  No future appointments.     (If no appt send self scheduling link. [email or web address])  Scheduling request sent?     []  Yes  []  No    Does patient need updated?  []  Yes  []  No  "

## 2024-10-02 MED ORDER — LOSARTAN POTASSIUM 25 MG PO TABS
25 | ORAL_TABLET | Freq: Every day | ORAL | 1 refills | Status: AC
Start: 2024-10-02 — End: ?

## 2024-10-06 ENCOUNTER — Encounter

## 2024-10-06 NOTE — Telephone Encounter (Signed)
"  Name of Medication(s) Requested:  Requested Prescriptions     Pending Prescriptions Disp Refills    potassium chloride  (KLOR-CON ) 10 MEQ extended release tablet [Pharmacy Med Name: POTASSIUM CL ER TABLETS] 90 tablet 1     Sig: TAKE 1 TABLET BY MOUTH EVERY OTHER DAY       Medication is on current medication list Yes    Dosage and directions were verified? Yes    Quantity verified: 90 day supply     Pharmacy Verified?  Yes    Last Appointment:  08/18/2024    Future appts:  No future appointments.     (If no appt send self scheduling link. [email or web address])  Scheduling request sent?     []  Yes  []  No    Does patient need updated?  []  Yes  []  No  "

## 2024-10-08 MED ORDER — POTASSIUM CHLORIDE ER 10 MEQ PO TBCR
10 | ORAL_TABLET | ORAL | 1 refills | Status: AC
Start: 2024-10-08 — End: ?
